# Patient Record
Sex: Female | Born: 1945 | Race: White | Hispanic: No | Smoking: Never smoker
Health system: Southern US, Community
[De-identification: ages and names within clinical notes are randomized; demographics above are authoritative.]

## PROBLEM LIST (undated history)

## (undated) DIAGNOSIS — K219 Gastro-esophageal reflux disease without esophagitis: Secondary | ICD-10-CM

## (undated) DIAGNOSIS — R609 Edema, unspecified: Secondary | ICD-10-CM

## (undated) DIAGNOSIS — M1990 Unspecified osteoarthritis, unspecified site: Secondary | ICD-10-CM

## (undated) DIAGNOSIS — I10 Essential (primary) hypertension: Secondary | ICD-10-CM

## (undated) DIAGNOSIS — M5126 Other intervertebral disc displacement, lumbar region: Secondary | ICD-10-CM

## (undated) DIAGNOSIS — E669 Obesity, unspecified: Secondary | ICD-10-CM

## (undated) DIAGNOSIS — D649 Anemia, unspecified: Secondary | ICD-10-CM

## (undated) DIAGNOSIS — K222 Esophageal obstruction: Secondary | ICD-10-CM

## (undated) DIAGNOSIS — H269 Unspecified cataract: Secondary | ICD-10-CM

## (undated) DIAGNOSIS — E785 Hyperlipidemia, unspecified: Secondary | ICD-10-CM

## (undated) HISTORY — DX: Unspecified cataract: H26.9

## (undated) HISTORY — DX: Gastro-esophageal reflux disease without esophagitis: K21.9

## (undated) HISTORY — PX: TONSILLECTOMY: SUR1361

## (undated) HISTORY — DX: Other intervertebral disc displacement, lumbar region: M51.26

## (undated) HISTORY — DX: Essential (primary) hypertension: I10

## (undated) HISTORY — DX: Unspecified osteoarthritis, unspecified site: M19.90

## (undated) HISTORY — DX: Edema, unspecified: R60.9

## (undated) HISTORY — DX: Obesity, unspecified: E66.9

## (undated) HISTORY — DX: Anemia, unspecified: D64.9

## (undated) HISTORY — DX: Hyperlipidemia, unspecified: E78.5

## (undated) HISTORY — DX: Esophageal obstruction: K22.2

## (undated) HISTORY — PX: BREAST CYST EXCISION: SHX579

---

## 1977-09-23 HISTORY — PX: TUBAL LIGATION: SHX77

## 1991-09-24 HISTORY — PX: BREAST BIOPSY: SHX20

## 1996-09-23 HISTORY — PX: LUMBAR LAMINECTOMY: SHX95

## 1999-06-28 ENCOUNTER — Encounter: Payer: Self-pay | Admitting: Obstetrics and Gynecology

## 1999-06-28 ENCOUNTER — Ambulatory Visit (HOSPITAL_COMMUNITY): Admission: RE | Admit: 1999-06-28 | Discharge: 1999-06-28 | Payer: Self-pay | Admitting: Obstetrics and Gynecology

## 1999-07-26 ENCOUNTER — Ambulatory Visit (HOSPITAL_COMMUNITY): Admission: RE | Admit: 1999-07-26 | Discharge: 1999-07-26 | Payer: Self-pay | Admitting: Obstetrics and Gynecology

## 1999-07-26 ENCOUNTER — Encounter: Payer: Self-pay | Admitting: Obstetrics and Gynecology

## 1999-08-08 ENCOUNTER — Ambulatory Visit (HOSPITAL_COMMUNITY): Admission: RE | Admit: 1999-08-08 | Discharge: 1999-08-08 | Payer: Self-pay | Admitting: Obstetrics and Gynecology

## 1999-08-08 ENCOUNTER — Encounter (INDEPENDENT_AMBULATORY_CARE_PROVIDER_SITE_OTHER): Payer: Self-pay | Admitting: Specialist

## 1999-09-24 HISTORY — PX: ABDOMINAL HYSTERECTOMY: SHX81

## 2000-05-05 ENCOUNTER — Encounter: Admission: RE | Admit: 2000-05-05 | Discharge: 2000-05-05 | Payer: Self-pay | Admitting: Obstetrics and Gynecology

## 2000-05-05 ENCOUNTER — Encounter: Payer: Self-pay | Admitting: Obstetrics and Gynecology

## 2000-06-10 ENCOUNTER — Ambulatory Visit (HOSPITAL_COMMUNITY): Admission: RE | Admit: 2000-06-10 | Discharge: 2000-06-10 | Payer: Self-pay | Admitting: Obstetrics and Gynecology

## 2000-06-10 ENCOUNTER — Encounter: Payer: Self-pay | Admitting: Obstetrics and Gynecology

## 2000-06-19 ENCOUNTER — Other Ambulatory Visit: Admission: RE | Admit: 2000-06-19 | Discharge: 2000-06-19 | Payer: Self-pay | Admitting: Obstetrics and Gynecology

## 2000-07-15 ENCOUNTER — Encounter: Payer: Self-pay | Admitting: Gastroenterology

## 2000-07-21 ENCOUNTER — Encounter: Payer: Self-pay | Admitting: Gastroenterology

## 2000-07-21 ENCOUNTER — Encounter (INDEPENDENT_AMBULATORY_CARE_PROVIDER_SITE_OTHER): Payer: Self-pay

## 2000-07-21 ENCOUNTER — Other Ambulatory Visit: Admission: RE | Admit: 2000-07-21 | Discharge: 2000-07-21 | Payer: Self-pay | Admitting: Gastroenterology

## 2000-07-22 ENCOUNTER — Encounter: Payer: Self-pay | Admitting: Gastroenterology

## 2000-07-22 ENCOUNTER — Ambulatory Visit (HOSPITAL_COMMUNITY): Admission: RE | Admit: 2000-07-22 | Discharge: 2000-07-22 | Payer: Self-pay | Admitting: Gastroenterology

## 2000-08-20 ENCOUNTER — Encounter (INDEPENDENT_AMBULATORY_CARE_PROVIDER_SITE_OTHER): Payer: Self-pay | Admitting: Specialist

## 2000-08-20 ENCOUNTER — Inpatient Hospital Stay (HOSPITAL_COMMUNITY): Admission: RE | Admit: 2000-08-20 | Discharge: 2000-08-23 | Payer: Self-pay | Admitting: Obstetrics and Gynecology

## 2001-05-06 ENCOUNTER — Encounter: Admission: RE | Admit: 2001-05-06 | Discharge: 2001-05-06 | Payer: Self-pay | Admitting: Obstetrics and Gynecology

## 2001-05-06 ENCOUNTER — Encounter: Payer: Self-pay | Admitting: Obstetrics and Gynecology

## 2001-05-12 ENCOUNTER — Encounter: Admission: RE | Admit: 2001-05-12 | Discharge: 2001-05-12 | Payer: Self-pay | Admitting: Obstetrics and Gynecology

## 2001-05-12 ENCOUNTER — Encounter: Payer: Self-pay | Admitting: Obstetrics and Gynecology

## 2001-07-24 ENCOUNTER — Other Ambulatory Visit: Admission: RE | Admit: 2001-07-24 | Discharge: 2001-07-24 | Payer: Self-pay | Admitting: Obstetrics and Gynecology

## 2001-08-13 ENCOUNTER — Other Ambulatory Visit: Admission: RE | Admit: 2001-08-13 | Discharge: 2001-08-13 | Payer: Self-pay | Admitting: Obstetrics and Gynecology

## 2002-05-07 ENCOUNTER — Encounter: Payer: Self-pay | Admitting: Family Medicine

## 2002-05-07 ENCOUNTER — Ambulatory Visit (HOSPITAL_COMMUNITY): Admission: RE | Admit: 2002-05-07 | Discharge: 2002-05-07 | Payer: Self-pay | Admitting: Family Medicine

## 2002-05-18 ENCOUNTER — Encounter: Admission: RE | Admit: 2002-05-18 | Discharge: 2002-05-18 | Payer: Self-pay | Admitting: Obstetrics and Gynecology

## 2002-05-18 ENCOUNTER — Encounter: Payer: Self-pay | Admitting: Obstetrics and Gynecology

## 2003-05-24 ENCOUNTER — Encounter: Payer: Self-pay | Admitting: Obstetrics and Gynecology

## 2003-05-24 ENCOUNTER — Encounter: Admission: RE | Admit: 2003-05-24 | Discharge: 2003-05-24 | Payer: Self-pay | Admitting: Obstetrics and Gynecology

## 2004-06-12 ENCOUNTER — Encounter: Admission: RE | Admit: 2004-06-12 | Discharge: 2004-06-12 | Payer: Self-pay | Admitting: Obstetrics and Gynecology

## 2004-06-18 ENCOUNTER — Encounter: Admission: RE | Admit: 2004-06-18 | Discharge: 2004-06-18 | Payer: Self-pay | Admitting: Obstetrics and Gynecology

## 2004-11-28 ENCOUNTER — Encounter: Admission: RE | Admit: 2004-11-28 | Discharge: 2004-11-28 | Payer: Self-pay | Admitting: Obstetrics and Gynecology

## 2005-06-19 ENCOUNTER — Encounter: Admission: RE | Admit: 2005-06-19 | Discharge: 2005-06-19 | Payer: Self-pay | Admitting: Obstetrics and Gynecology

## 2006-06-23 ENCOUNTER — Encounter: Admission: RE | Admit: 2006-06-23 | Discharge: 2006-06-23 | Payer: Self-pay | Admitting: Obstetrics and Gynecology

## 2007-07-08 ENCOUNTER — Encounter: Admission: RE | Admit: 2007-07-08 | Discharge: 2007-07-08 | Payer: Self-pay | Admitting: Obstetrics and Gynecology

## 2008-06-13 ENCOUNTER — Ambulatory Visit: Payer: Self-pay | Admitting: Gastroenterology

## 2008-06-20 ENCOUNTER — Ambulatory Visit: Payer: Self-pay | Admitting: Gastroenterology

## 2008-07-11 ENCOUNTER — Encounter: Admission: RE | Admit: 2008-07-11 | Discharge: 2008-07-11 | Payer: Self-pay | Admitting: Obstetrics and Gynecology

## 2008-08-31 ENCOUNTER — Ambulatory Visit (HOSPITAL_COMMUNITY): Admission: RE | Admit: 2008-08-31 | Discharge: 2008-08-31 | Payer: Self-pay | Admitting: Obstetrics and Gynecology

## 2008-09-08 ENCOUNTER — Encounter (INDEPENDENT_AMBULATORY_CARE_PROVIDER_SITE_OTHER): Payer: Self-pay | Admitting: Interventional Radiology

## 2008-09-08 ENCOUNTER — Other Ambulatory Visit: Admission: RE | Admit: 2008-09-08 | Discharge: 2008-09-08 | Payer: Self-pay | Admitting: Interventional Radiology

## 2008-09-08 ENCOUNTER — Encounter: Admission: RE | Admit: 2008-09-08 | Discharge: 2008-09-08 | Payer: Self-pay | Admitting: Obstetrics and Gynecology

## 2008-12-12 ENCOUNTER — Encounter: Payer: Self-pay | Admitting: Cardiovascular Disease

## 2008-12-12 ENCOUNTER — Ambulatory Visit: Payer: Self-pay | Admitting: Cardiovascular Disease

## 2008-12-12 DIAGNOSIS — E663 Overweight: Secondary | ICD-10-CM | POA: Insufficient documentation

## 2008-12-12 DIAGNOSIS — R9431 Abnormal electrocardiogram [ECG] [EKG]: Secondary | ICD-10-CM | POA: Insufficient documentation

## 2008-12-12 DIAGNOSIS — E782 Mixed hyperlipidemia: Secondary | ICD-10-CM | POA: Insufficient documentation

## 2008-12-12 DIAGNOSIS — I10 Essential (primary) hypertension: Secondary | ICD-10-CM | POA: Insufficient documentation

## 2008-12-12 DIAGNOSIS — E1169 Type 2 diabetes mellitus with other specified complication: Secondary | ICD-10-CM | POA: Insufficient documentation

## 2009-01-10 ENCOUNTER — Ambulatory Visit: Payer: Self-pay | Admitting: Cardiovascular Disease

## 2009-01-10 ENCOUNTER — Encounter: Payer: Self-pay | Admitting: Cardiovascular Disease

## 2009-01-10 LAB — CONVERTED CEMR LAB
CO2: 30 meq/L (ref 19–32)
Calcium: 9.4 mg/dL (ref 8.4–10.5)
Chloride: 105 meq/L (ref 96–112)
GFR calc non Af Amer: 89.25 mL/min (ref >60)
Glucose, Bld: 98 mg/dL (ref 70–99)
Potassium: 5 meq/L (ref 3.5–5.1)

## 2009-07-11 DIAGNOSIS — E7800 Pure hypercholesterolemia, unspecified: Secondary | ICD-10-CM | POA: Insufficient documentation

## 2009-07-12 ENCOUNTER — Ambulatory Visit: Payer: Self-pay | Admitting: Cardiovascular Disease

## 2009-07-12 ENCOUNTER — Encounter: Admission: RE | Admit: 2009-07-12 | Discharge: 2009-07-12 | Payer: Self-pay | Admitting: Obstetrics and Gynecology

## 2009-07-18 ENCOUNTER — Encounter: Admission: RE | Admit: 2009-07-18 | Discharge: 2009-07-18 | Payer: Self-pay | Admitting: Obstetrics and Gynecology

## 2010-07-23 ENCOUNTER — Encounter: Admission: RE | Admit: 2010-07-23 | Discharge: 2010-07-23 | Payer: Self-pay | Admitting: Obstetrics and Gynecology

## 2010-09-28 ENCOUNTER — Encounter: Payer: Self-pay | Admitting: Gastroenterology

## 2010-10-14 ENCOUNTER — Encounter: Payer: Self-pay | Admitting: Obstetrics and Gynecology

## 2010-10-25 NOTE — Letter (Signed)
Summary: New Patient letter  [facility]  [address] [place], [place] [address]   Phone: [phone]  Fax: [phone]       09/28/2010 MRN: [id number]  Sarah Benton [address] [place], [place]  [address]  Dear Sarah Benton,  Welcome to the Gastroenterology Division at [facility].    You are scheduled to see Dr.  Russella Dar on 11-06-10 at 2:30pm on the 3rd floor at [facility], [address].  We ask that you try to arrive at our office 15 minutes prior to your appointment time to allow for check-in.  We would like you to complete the enclosed self-administered evaluation form prior to your visit and bring it with you on the day of your appointment.  We will review it with you.  Also, please bring a complete list of all your medications or, if you prefer, bring the medication bottles and we will list them.  Please bring your insurance card so that we may make a copy of it.  If your insurance requires a referral to see a specialist, please bring your referral form from your primary care physician.  Co-payments are due at the time of your visit and may be paid by cash, check or credit card.     Your office visit will consist of a consult with your physician (includes a physical exam), any laboratory testing he/she may order, scheduling of any necessary diagnostic testing (e.g. x-ray, ultrasound, CT-scan), and scheduling of a procedure (e.g. Endoscopy, Colonoscopy) if required.  Please allow enough time on your schedule to allow for any/all of these possibilities.    If you cannot keep your appointment, please call [phone] to cancel or reschedule prior to your appointment date.  This allows [place] the opportunity to schedule an appointment for another patient in need of care.  If you do not cancel or reschedule by 5 p.m. the business day prior to your appointment date, you will be charged a $50.00 late cancellation/no-show fee.    Thank you for choosing [facility] for your medical needs.  We appreciate the opportunity to care for you.  Please visit [place] at our website  to learn more about our practice.                     Sincerely,                                                             The Gastroenterology Division

## 2010-11-08 ENCOUNTER — Ambulatory Visit (INDEPENDENT_AMBULATORY_CARE_PROVIDER_SITE_OTHER): Payer: Medicare Other | Admitting: Gastroenterology

## 2010-11-08 ENCOUNTER — Encounter: Payer: Self-pay | Admitting: Gastroenterology

## 2010-11-08 DIAGNOSIS — R1319 Other dysphagia: Secondary | ICD-10-CM

## 2010-11-08 DIAGNOSIS — K219 Gastro-esophageal reflux disease without esophagitis: Secondary | ICD-10-CM

## 2010-11-08 NOTE — Assessment & Plan Note (Signed)
Summary: Gastroenterology  [facility]   GI CONSULTATION NOTE   NAME:   Sarah, Benton        OFFICE NO:  [id number]  DATE:  07/15/00                    REFERRING PHYSICIAN:  Tracey Harries, M.D.  REASON FOR REFERRAL:  Left lower quadrant pain.  HISTORY:  Sarah Benton is  a very nice, 65 year old white female referred through the courtesy of Dr. Tracey Harries.  She relates a 3 to 4 week history of constant left lower quadrant ache.  It is somewhat changed by movement and bending, however, it has not been affected by meals or bowel movements.  Her symptoms were constant for about 3 weeks but now have become intermittent for the past week.  She was evaluated by Dr. Ambrose Mantle for this problem after having an episode of abnormal vaginal bleeding.  Pelvic examination and rectal examination were unremarkable and subsequent stool hemoccults were also negative X 6.  She underwent a pelvic ultrasound with transvaginal ultrasound on 06/10/00 which revealed thickening of the endometrium and a left ovarian cyst that appeared simple, measuring 3.3 cm. In greatest dimension.  She relates no change in bowel habits, diarrhea, constipation, melena or hematochezia.  The remainder of the review of systems as listed in the handwritten diagnostic evaluation form.  PAST MEDICAL & SURGICAL HISTORY:  Benign left breast cyst biopsy in 1993, status post lumbar laminectomy in 1998 with lumbar arthritis, status post D&C in 2000.   SOCIAL HISTORY:   She denies cigarette and alcohol usage.  She is retired and married with two children.  MEDICATIONS:  None.     DRUG ALLERGIES: Septra leading to nausea.  FH: Family history is negative for colon cancer, colon polyps, and inflammatory bowel disease.  Her mother had ovarian cancer.     PHYSICAL EXAMINATION:  Patient is an overweight, white female, in no acute distress.  Height: 5'5".  Weight is 224 lbs.  Blood pressure is 130/88.  Pulse is 60 and regular.  HEENT  exam: anicteric sclerae.  Oropharynx is clear.  Chest: clear to auscultation and percussion.  Cardiac: regular rate and rhythm without murmurs .appreciated.  Abdomen is large, soft and non-distended.  There is mild tenderness along the left upper side and very left lower quadrant.  No definite hernia or other abnormality is palpated.  No adenopathy is palpated.  No hepatosplenomegaly or masses.  Normal active bowel sounds.  Rectal exam: deferred with recent exam that was unremarkable by Dr. Ambrose Mantle with Hemoccult negative.   ASSESSMENT:   Atypical left lower quadrant and left upper thigh.  Gastrointestinal vs. musculoskeletal etiologies.  Rule out a subtle inguinal or femoral hernia.  Will proceed with an abdominal and pelvic CT scan for further evaluation.  Will also proceed with a colonoscopy for colorectal cancer screening and evaluation for colorectal neoplasms, diverticulosis and other abnormalities. Symptoms may also be related to her known left ovarian cyst.     Judie Petit T. Pleas Koch. M.D. F.A.C.G.  [id number] cc:  Dr. Tracey Harries  D: 07/15/00; T: 07/16/00; Job # [id number]

## 2010-11-08 NOTE — Procedures (Signed)
Summary: Colonoscopy and biopsy   Colonoscopy  Procedure date:  07/21/2000  Findings:      Pathology:  Hyperplastic polyp.     Location:  [facility].    Procedures Next Due Date:    Colonoscopy: 07/2005 Patient Name: Sarah Benton, Sarah Benton MRN:  Procedure Procedures: Colonoscopy CPT: [id number].    with Hot Biopsy(s)CPT: [id number].  Personnel: Endoscopist: Venita Lick. Russella Dar, MD, Clementeen Graham.  Referred By: Malachi Pro. Ambrose Mantle, MD.  Exam Location: Exam performed in [facility] Outpatient  Patient Consent: Procedure, Alternatives, Risks and Benefits discussed, consent obtained, from patient. Consent to be contacted was not given.  Indications Symptoms: Abdominal pain / bloating.  Average Risk Screening Routine.  History  Pre-Exam Physical: Performed Jul 21, 2000. Cardio-pulmonary exam, Rectal exam, HEENT exam , Abdominal exam, Extremity exam, Neurological exam WNL.  Exam Exam: Extent of exam reached: Cecum, extent intended: Cecum.  The cecum was identified by appendiceal orifice and IC valve. Patient position: on left side. Colon retroflexion performed. Images taken. ASA Classification: I. Tolerance: good.  Monitoring: Pulse and BP monitoring, Oximetry used. Supplemental O2 given.  Colon Prep Used Golytely for colon prep. Dose Used: 4L. Prep results: good.  Sedation Meds: Fentanyl 100 mcg. Versed 10 mg.  Findings NORMAL EXAM: Cecum.  POLYP: Sigmoid Colon, Maximum size: 6 mm. sessile polyp. Distance from Anus 25 cm. Procedure:  hot biopsy, removed, retrieved, Polyp sent to pathology. ICD9: Colon Polyps: 211.3.   Assessment Abnormal examination, see findings above.  Diagnoses: 211.3: Colon Polyps.   Events  Unplanned Interventions: No intervention was required.  Unplanned Events: There were no complications. Plans  Post Exam Instructions: No aspirin or non-steroidal containing medications: 2 weeks.  Medication Plan: Continue current medications.  Patient  Education: Patient given standard instructions for: Polyps. Patient instructed to get routine colonoscopy every 5 years.  Disposition: After procedure patient sent to recovery. After recovery patient sent home.  Scheduling/Referral: Referring provider, to [employer]. Ambrose Mantle, MD,  Colonoscopy, to [employer] T. Russella Dar, MD, [facility], around Jul 21, 2005.   Comments: No GI etiology for LLQ pain found. This report was created from the original endoscopy report, which was reviewed and signed by the above listed endoscopist.    cc: Malachi Pro. Ambrose Mantle, MD   SP Surgical Pathology - STATUS: Final             By: Guilford Shi MD , Clovis Pu       Perform Date: 29Oct01 13:58  Ordered By: Rica Records Date:  Facility: [facility]                              Department: CPATH  Service Report Text  [facility]   [address] [place], [place] [address]   [phone]    REPORT OF SURGICAL PATHOLOGY    Case #: [id number]   Patient Name: Sarah Benton, Sarah Benton   PID: [id number]   Pathologist: Marcie Bal, MD   DOB/Age [birth year] (Age: 65) Gender: F   Date Taken: 07/21/00   Date Received: 07/21/00    FINAL DIAGNOSIS    ***MICROSCOPIC EXAMINATION AND DIAGNOSIS***    COLON: HYPERPLASTIC POLYP. NO ADENOMATOUS CHANGE OR MALIGNANCY   IDENTIFIED.    ab   Date Reported: 07/22/00 Marcie Bal, MD   *** Electronically Signed Out By TAZ ***    specimen(s) obtained  Colon, polyp, sigmoid    Gross Description   Colon, polyp, sigmoid:   Received in formalin is a tan, soft tissue fragment that is   submitted in toto. Size: 0.3 cm (JBM:atb 10/29)    ab/

## 2010-11-14 ENCOUNTER — Encounter (AMBULATORY_SURGERY_CENTER): Payer: Medicare Other | Admitting: Gastroenterology

## 2010-11-14 ENCOUNTER — Other Ambulatory Visit: Payer: Self-pay | Admitting: Gastroenterology

## 2010-11-14 ENCOUNTER — Encounter: Payer: Self-pay | Admitting: Gastroenterology

## 2010-11-14 DIAGNOSIS — K219 Gastro-esophageal reflux disease without esophagitis: Secondary | ICD-10-CM

## 2010-11-14 DIAGNOSIS — K2970 Gastritis, unspecified, without bleeding: Secondary | ICD-10-CM

## 2010-11-14 DIAGNOSIS — K2990 Gastroduodenitis, unspecified, without bleeding: Secondary | ICD-10-CM

## 2010-11-14 DIAGNOSIS — K222 Esophageal obstruction: Secondary | ICD-10-CM

## 2010-11-14 DIAGNOSIS — K319 Disease of stomach and duodenum, unspecified: Secondary | ICD-10-CM

## 2010-11-14 DIAGNOSIS — R1319 Other dysphagia: Secondary | ICD-10-CM

## 2010-11-14 NOTE — Assessment & Plan Note (Signed)
Summary: DYSPHAGIA/YF--SCH WITH DEBBIE [phone] MEDICARE AND BLUE .Marland KitchenMarland Kitchen   History of Present Illness Visit Type: Initial Consult Primary GI MD: Elie Goody MD [facility] Primary Provider: Paulene Floor, [place] Requesting Provider: Rosiland Oz Chief Complaint: Dysphagia, Feels like food is getting stuck in her esophagus History of Present Illness:   This is a 65 year old female that I have seen in the past. She relates a history of recurrent solid food dysphagia since the summer of 02/09/2010. She also frequently notes substernal pressure when she goes to bed at night. These symptoms are generally relieved with over-the-counter Zantac. Her dysphagia occurs about once or twice each week and she has had to induce vomiting on multiple occasions.   GI Review of Systems    Reports belching and  dysphagia with solids.      Denies abdominal pain, acid reflux, bloating, chest pain, dysphagia with liquids, heartburn, loss of appetite, nausea, vomiting, vomiting blood, weight loss, and  weight gain.        Denies anal fissure, black tarry stools, change in bowel habit, constipation, diarrhea, diverticulosis, fecal incontinence, heme positive stool, hemorrhoids, irritable bowel syndrome, jaundice, light color stool, liver problems, rectal bleeding, and  rectal pain. Preventive Screening-Counseling & Management      Drug Use:  no.     Current Medications (verified): 1)  Lipitor 40 Mg Tabs (Atorvastatin Calcium) .Marland Kitchen.. 1 Tab By Mouth Once Daily 2)  Lisinopril-Hydrochlorothiazide 20-12.5 Mg Tabs (Lisinopril-Hydrochlorothiazide) .Marland Kitchen.. 1 By Mouth Once Daily 3)  Vitamin D3 2000 Unit Caps (Cholecalciferol) .Marland Kitchen.. 1 By Mouth Once Daily 4)  Mobic 15 Mg Tabs (Meloxicam) .Marland Kitchen.. 1 By Mouth Once Daily  Allergies (verified): No Known Drug Allergies  Past History:  Past Medical History: ESSENTIAL HYPERTENSION, BENIGN (ICD-401.1) MIXED HYPERLIPIDEMIA (ICD-272.2) HYPERCHOLESTEROLEMIA (ICD-272.0) EDEMA  (ICD-782.3) Abnormal ECG Ruptured lumbar disc Arthritis Hyperplastic Polyp 02-10-00 Obesity  Past Surgical History: Hysterectomy Lumbar laminectomy in 1997-02-09  Left breast biopsy in Feb 10, 1992  Tubal ligation in 1978-02-09  Family History: Mother died age 5 Parkinsons Father died age 38 Prostate Ca No FH of Colon Cancer:  Social History: Married Sedentary Non-drinker Non-smoker Two children Retired from school system.  Cares of grandaughter with apraxia Illicit Drug Use - no Drug Use:  no  Review of Systems       The patient complains of arthritis/joint pain and back pain.         The pertinent positives and negatives are noted as above and in the HPI. All other ROS were reviewed and were negative.   Vital Signs:  Patient profile:   65 year old female Height:      65 inches Weight:      228 pounds BMI:     38.08 BSA:     2.09 Pulse rate:   80 / minute Pulse rhythm:   regular BP sitting:   118 / 82  (left arm)  Vitals Entered By: Merri Ray CMA Duncan Dull) (November 08, 2010 9:26 AM)  Physical Exam  General:  Well developed, well nourished, no acute distress. Head:  Normocephalic and atraumatic. Eyes:  PERRLA, no icterus. Ears:  Normal auditory acuity. Mouth:  No deformity or lesions, dentition normal. Neck:  Supple; no masses or thyromegaly. Lungs:  Clear throughout to auscultation. Heart:  Regular rate and rhythm; no murmurs, rubs,  or bruits. Abdomen:  Soft, nontender and nondistended. No masses, hepatosplenomegaly or hernias noted. Normal bowel sounds. Msk:  Symmetrical with no gross deformities. Normal posture. Pulses:  Normal pulses noted. Extremities:  No clubbing, cyanosis, edema or deformities noted. Neurologic:  Alert and  oriented x4;  grossly normal neurologically. Cervical Nodes:  No significant cervical adenopathy. Inguinal Nodes:  No significant inguinal adenopathy. Psych:  Alert and cooperative. Normal mood and affect.  Impression &  Recommendations:  Problem # 1:  OTHER DYSPHAGIA (ICD-787.29) Solid food dysphagia for the past 9 months. I suspect she has a peptic stricture, rule out neoplasms, and other disorders. Please take Zantac on a daily basis until endoscopy and maintain standard antireflux measures. The risks, benefits and alternatives to endoscopy with possible biopsy and possible dilation were discussed with the patient and they consent to proceed. The procedure will be scheduled electively. Orders: EGD SAV (EGD SAV)  Problem # 2:  GERD (ICD-530.81) Suspected underlying GERD. Plans as above. If she does have a peptic stricture or, esophagitis plan to begin a proton pump inhibitor after her endoscopy. Orders: EGD SAV (EGD SAV)  Problem # 3:  SCREENING COLORECTAL-CANCER (ICD-V76.51) Average risk for colorectal cancer. Colonoscopy due October 2019.  Patient Instructions: 1)  Avoid foods high in acid content ( tomatoes, citrus juices, spicy foods) . Avoid eating within 3 to 4 hours of lying down or before exercising. Do not over eat; try smaller more frequent meals. Elevate head of bed four inches when sleeping.  2)  Upper Endoscopy brochure given.  3)  Copy sent to : Paulene Floor, [place]   4)  The medication list was reviewed and reconciled.  All changed / newly prescribed medications were explained.  A complete medication list was provided to the patient / caregiver.

## 2010-11-14 NOTE — Letter (Signed)
Summary: EGD Instructions  [facility]  [address] [place], [place] [address]   Phone: [phone]  Fax: [phone]       Sarah Benton    March 13, 1946    MRN: [id number]       Procedure Day Dorna Bloom: Wednesday February 22nd, 2012     Arrival Time:  10:30am     Procedure Time: 11:30am     Location of Procedure:                    _ x _ [facility] (4th Floor)    PREPARATION FOR ENDOSCOPY   On 11/14/10 THE DAY OF THE PROCEDURE:  1.   No solid foods, milk or milk products are allowed after midnight the night before your procedure.  2.   Do not drink anything colored red or purple.  Avoid juices with pulp.  No orange juice.  3.  You may drink clear liquids until 9:30am, which is 2 hours before your procedure.                                                                                                CLEAR LIQUIDS INCLUDE: Water Jello Ice Popsicles Tea (sugar ok, no milk/cream) Powdered fruit flavored drinks Coffee (sugar ok, no milk/cream) Gatorade Juice: apple, white grape, white cranberry  Lemonade Clear bullion, consomm, broth Carbonated beverages (any kind) Strained chicken noodle soup Hard Candy   MEDICATION INSTRUCTIONS  Unless otherwise instructed, you should take regular prescription medications with a small sip of water as early as possible the morning of your procedure.        OTHER INSTRUCTIONS  You will need a responsible adult at least 67 years of age to accompany you and drive you home.   This person must remain in the waiting room during your procedure.  Wear loose fitting clothing that is easily removed.  Leave jewelry and other valuables at home.  However, you may wish to bring a book to read or an iPod/MP3 player to listen to music as you wait for your procedure to start.  Remove all body piercing jewelry and leave at home.  Total time from sign-in until discharge is approximately 2-3 hours.  You should go  home directly after your procedure and rest.  You can resume normal activities the day after your procedure.  The day of your procedure you should not:   Drive   Make legal decisions   Operate machinery   Drink alcohol   Return to work  You will receive specific instructions about eating, activities and medications before you leave.    The above instructions have been reviewed and explained to me by   _______________________    I fully understand and can verbalize these instructions _____________________________ Date _________

## 2010-11-19 ENCOUNTER — Encounter: Payer: Self-pay | Admitting: Gastroenterology

## 2010-11-20 NOTE — Miscellaneous (Signed)
Summary: RX omeprazole 20mg  po  Clinical Lists Changes  Medications: Added new medication of OMEPRAZOLE 20 MG  CPDR (OMEPRAZOLE) 1 each day 30 minutes before meal - Signed Rx of OMEPRAZOLE 20 MG  CPDR (OMEPRAZOLE) 1 each day 30 minutes before meal;  #30 x 11;  Signed;  Entered by: Sherren Kerns RN;  Authorized by: Meryl Dare MD Clementeen Graham;  Method used: Faxed to [facility], [address] [place], [place], [place]  [address], Ph: [phone] or [phone], Fax: [phone] Observations: Added new observation of ALLERGY REV: Done (11/14/2010 11:59) Added new observation of NKA: T (11/14/2010 11:59)    Prescriptions: OMEPRAZOLE 20 MG  CPDR (OMEPRAZOLE) 1 each day 30 minutes before meal  #30 x 11   Entered by:   Sherren Kerns RN   Authorized by:   Meryl Dare MD [facility]   Signed by:   Sherren Kerns RN on 11/14/2010   Method used:   Faxed to ...       [facility]       [address]       [place], [place]  [address]       Ph: [phone] or [phone]       Fax: [phone]   RxID:   [id number]

## 2010-11-20 NOTE — Procedures (Addendum)
Summary: Upper Endoscopy w/DIL  Patient: Sarah Benton Note: All result statuses are Final unless otherwise noted.  Tests: (1) Upper Endoscopy w/DIL (UED)  UED Upper Endoscopy w/DIL                             DONE     [facility]     [address]     [place], [place]  [address]           ENDOSCOPY PROCEDURE REPORT           PATIENT:  [name], [name]  MR#:  [id number]     BIRTHDATE:  [birth year], 64 yrs. old  GENDER:  female     ENDOSCOPIST:  Judie Petit T. Russella Dar, MD, [facility]           PROCEDURE DATE:  11/14/2010     PROCEDURE:  EGD with dilatation over guidewire, EGD with biopsy     ASA CLASS:  Class II     INDICATIONS:  1) dysphagia  2) GERD     MEDICATIONS:  Fentanyl 75 mcg IV, Versed 9 mg IV     TOPICAL ANESTHETIC:  Exactacain Spray     DESCRIPTION OF PROCEDURE:   After the risks benefits and     alternatives of the procedure were thoroughly explained, informed     consent was obtained.  The [id number] endoscope was     introduced through the mouth and advanced to the second portion of     the duodenum, without limitations.  The instrument was slowly     withdrawn as the mucosa was carefully examined.           <<PROCEDUREIMAGES>>           A stricture was found at the gastroesophageal junction. It was     benign appearing and circumferential. It was 12 mm in diameter.     Multiple biopsies were obtained and sent to pathology.  Mild     gastritis was found in the antrum. It was erosive and     erythematous. Multiple biopsies were obtained and sent to     pathology. The duodenal bulb was normal in appearance, as was the     postbulbar duodenum. The examination was otherwise normal.     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 Russella Dar, MD, Clementeen Graham           CC:  Rudi Heap, MD  n.     eSIGNED:   Malcolm T. Stark at 11/14/2010 11:43 AM           Benjiman Core, [id number]  Note: An exclamation mark (!) indicates a result that was not dispersed into the flowsheet. Document Creation Date: 11/14/2010 11:44 AM _______________________________________________________________________  (1) Order result status: Final Collection or observation date-time: 11/14/2010 11:37 Requested date-time:  Receipt date-time:  Reported date-time:  Referring Physician:   Ordering Physician: Claudette Head [phone]) Specimen Source:  Source: Launa Grill Order Number: [id number] Lab site:

## 2010-11-29 NOTE — Letter (Signed)
Summary: Patient [facility] Biopsy Results  [facility]  [address] [place], [place] [address]   Phone: [phone]  Fax: [phone]        November 19, 2010 MRN: [id number]    Sarah Benton [address] [place], [place]  [address]    Dear Ms. Bellisario,  I am pleased to inform you that the biopsies taken during your recent endoscopic examination did not show any evidence of cancer upon pathologic examination. The biopsies showed a mild reactive gastopathy.  Continue with the treatment plan as outlined on the day of your      exam.  Please call us if you are having persistent problems or have questions about your condition that have not been fully answered at this time.  Sincerely,  Meryl Dare MD [facility]  This letter has been electronically signed by your physician.  Appended Document: Patient Notice-Endo Biopsy Results letter mailed

## 2011-01-17 ENCOUNTER — Ambulatory Visit (INDEPENDENT_AMBULATORY_CARE_PROVIDER_SITE_OTHER): Payer: Medicare Other | Admitting: Gastroenterology

## 2011-01-17 ENCOUNTER — Encounter: Payer: Self-pay | Admitting: Gastroenterology

## 2011-01-17 VITALS — BP 104/66 | HR 60 | Ht 65.0 in | Wt 225.4 lb

## 2011-01-17 DIAGNOSIS — K219 Gastro-esophageal reflux disease without esophagitis: Secondary | ICD-10-CM

## 2011-01-17 DIAGNOSIS — K222 Esophageal obstruction: Secondary | ICD-10-CM

## 2011-01-17 DIAGNOSIS — Z1211 Encounter for screening for malignant neoplasm of colon: Secondary | ICD-10-CM

## 2011-01-17 NOTE — Patient Instructions (Signed)
Cc: Paulene Floor, NP

## 2011-01-17 NOTE — Progress Notes (Signed)
History of Present Illness: This is a 65 year old female who returns for followup of dysphagia. She underwent endoscopy in February with dilation of an esophagogastric junction stricture to 16 mm. She has had no dysphagia or reflux symptoms. She is currently maintained on omeprazole.  Current Medications, Allergies, Past Medical History, Past Surgical History, Family History and Social History were reviewed in [employer] record.  Physical Exam: General: Well developed , well nourished, no acute distress Head: Normocephalic and atraumatic Eyes:  sclerae anicteric, EOMI Ears: Normal auditory acuity Mouth: No deformity or lesions Lungs: Clear throughout to auscultation Heart: Regular rate and rhythm; no murmurs, rubs or bruits Abdomen: Soft, non tender and non distended. No masses, hepatosplenomegaly or hernias noted. Normal bowel sounds Musculoskeletal: Symmetrical with no gross deformities  Pulses:  Normal pulses noted Extremities: No clubbing, cyanosis, edema or deformities noted Neurological: Alert oriented x 4, grossly nonfocal Psychological:  Alert and cooperative. Normal mood and affect  Assessment and Recommendations:  1. GERD with a peptic stricture. Long-term antireflux measures. Long-term PPI therapy to help prevent stricture recurrence. Continue omeprazole 20 mg every morning. Ongoing followup with her primary physician and I will see back on referral.  2. SCREENING, COLORECTALCANCER Average risk for colorectal cancer. Colonoscopy due October 2019.

## 2011-02-08 NOTE — Op Note (Signed)
[facility]  Patient:    Sarah Benton, Sarah Benton             MRN: [id number] Proc. Date: 08/20/00 Adm. Date:  [id number] Attending:  Malon Kindle                           Operative Report  PREOPERATIVE DIAGNOSES: 1. Left adnexal mass. 2. Persistent left lower abdominal pain. 3. Negative gastrointestinal workup.  POSTOPERATIVE DIAGNOSIS: 1. Left adnexal mass. 2. Persistent left lower abdominal pain. 3. Negative gastrointestinal workup. 4. Left hydrosalpinx. 5. Dense pelvic adhesions.  OPERATION: 1. Abdominal hysterectomy. 2. Bilateral salpingo-oophorectomy. 3. Lysis of adhesions.  SURGEON:  Malachi Pro. Ambrose Mantle, M.D.  ASSISTANT:  Zenaida Niece, M.D.  ANESTHESIA:  General.  DESCRIPTION OF PROCEDURE:  The patient was brought to the operating room and placed under satisfactory general anesthesia.  She was placed in frog leg position.  The abdomen, vulva, vagina, and urethra were prepped with Betadine solution.  The bladder was catheterized with a Foley and hooked to straight drain.  The patient was then placed supine.  The abdomen draped in a sterile filed and was entered with a transverse incision above the lower abdominal fold and carried in layers through the skin, subcutaneous tissue, and fascia. The fascia was separated from the rectus muscle superiorly and inferiorly. The rectus muscle was split in the midline.  Peritoneum was opened vertically. It was difficult to examine the upper abdomen.  I never felt the left kidney. I did feel the right kidney.  I felt the liver which seemed to be smooth, and the gallbladder was smooth and tensely cystic.  Exploration of the pelvis revealed the left adnexal mass, but on dissection of adhesions surrounding it, it was not the left ovary but was a hydrosalpinx.  There were a lot of adhesions binding it to the left pelvic wall.  There were also adhesions binding the distal part of the right  tube to the pelvic wall.  There were adhesions posterior to the uterus in the cul-de-sac.  The uterus had very minimal mobility.  After using packs and retractors to prepare the operative field, I did elevate the uterus as much as possible with clamps across the upper pedicles.  I could not do anything to the right adnexa because visibility was poor.  I did clamp, cut, and suture ligate the left infundibulopelvic ligament and doubly suture ligated it.  I then cauterized the left round ligament, created a bladder flap, cauterized the right round ligament, and cut the right utero-ovarian ligament and suture ligated it.  I then gradually in stepwise fashion worked down the sides of the uterus. Visibility was always poor.  I finally reached the uterosacral ligaments, clamped, cut, suture ligated them, and held them.  I kept the bladder well inferior to the operative field.  I entered the right side of the vagina and then removed the uterus, left tube, and ovary by transecting the upper vagina. Vaginal angle sutures were placed, and then the central portion of the cuff was closed with interrupted figure-of-eight sutures of 0 Vicryl.  I sutured the uterosacral ligaments together in the midline, filled the bladder with methylene blue stained fluid.  There was no leakage and no sutures close to the bladder.  I then was able to see the right adnexa better.  I doubly suture ligated the right infundibulopelvic ligament and removed the right tube and ovary by cutting above clamps  and suture ligating all the clamps.  Hemostasis was achieved posterior to the cuff.  Reperitonealization was done across the cuff.  Visual inspection of all pedicles revealed no active bleeding.  There were some omental adhesions stuck to the left lateral abdominal wall that were not lysed.  The omentum was placed over the pelvis.  The abdominal wall was then closed in layers, and it should be mentioned that washings had been  taken at the outset of the operation, but they were discarded because there was nothing suggestive of malignancy, and the mass was actually a hydrosalpinx. The abdominal wall was 0 closed with a running suture of 0 Vicryl on the peritoneum, interrupted 0 Vicryl in the rectus muscle, two running sutures of 0 Vicryl on the fascia, a running 3-0 Vicryl on the subcutaneous tissue, and staples on the skin.  The patient seemed to tolerate the procedure well. Blood loss was thought to be 700 cc.  Sponge and needle counts were correct, and she was returned to recovery in satisfactory condition.DD:  08/20/00 TD:  08/20/00 Job: [id number]

## 2011-02-08 NOTE — Discharge Summary (Signed)
[facility]  Patient:    Sarah Benton, Sarah Benton             MRN: [id number] Adm. Date:  [id number] Disc. Date: [id number] Attending:  Malon Kindle                           Discharge Summary  HISTORY OF PRESENT ILLNESS AND HOSPITAL COURSE:  This is a 65 year old white female admitted for surgical removal of a left adnexal mass.  The patient also has left lower quadrant pain, has had a negative GI workup.  The patient underwent a TAH and BSO on August 20, 2000.  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  Postoperatively, the patient did well.  She had good bowel and urinary function, ambulated well without difficulty.  Her incision healed appropriately.  Staples were removed, strips were applied, and on the third postoperative day she was discharged. Pathology report showed left ovary and tube, benign ovarian serous cystadenoma.  No evidence of borderline change or malignancy.  No fallopian tube tissue identified in the mass that was sent as the left ovary and tube. Uterine cervix, right ovary, and fallopian tube:  Mild cervicitis and squamous metaplasia, proliferative endometrium, and endometrial polyp with foci of simple hyperplasia without atypia.  No atypical features or evidence of carcinoma.  Adenomyosis, leiomyomata intramural and subserosal.  Serosal uterine fibrous adhesions right ovary with focal endosalpingiosis.  Right fallopian tube benign peritubal cyst.  LABORATORY DATA:  Admission hemoglobin 13.0, hematocrit 37.3, white count 9500, platelet count 277,000, 69 segs, 23 lymphs, 4 monos, 1 eosinophil, 1 basophil.  Comprehensive metabolic profile was normal.  Urinalysis was negative except for 6-10 white cells, 0-5 red cells.  EKG:  Normal sinus rhythm, low voltage, borderline EKG.  No significant change since last  tracing.  FINAL DIAGNOSES: 1. Left adnexal mass by pathology report secondary to serous cystadenoma of    the left ovary. 2. Persistent left lower abdominal pain 3. Adenomyosis. 4. Uterine leiomyomata. 5. Pelvic adhesions.  OPERATIONS:  Abdominal hysterectomy, bilateral salpingo-oophorectomy, lysis of adhesions.  CONDITION ON DISCHARGE:  Improved.  DISCHARGE INSTRUCTIONS:  No vaginal entrance.  No heavy lifting or strenuous activity.  Report any consistent temperature elevation greater than 100.4 degrees.  Report any heavy vaginal bleeding.  FOLLOW-UP:  Return to the office in 10-14 days.  MEDICATIONS:  Take Mepergan Fortis 16 tablets 1 every four to six hours as needed for pain.  The patient is given a prescription for doxycycline 100 mg 14 tablets, to take 1 by mouth every 12 hours for seven days if her temperature does continue to be above 100.4.  She has had two elevations in the hospital, on the first postoperative day 100.9 and on the second postoperative day 100.4.  She is to call the physician if her temperature continues to be elevated and begin the doxycycline at his request. DD:  08/23/00 TD:  08/24/00 Job: [id number]

## 2011-02-08 NOTE — H&P (Signed)
[facility]  Patient:    Sarah Benton, Sarah Benton                      MRN: [id number] Adm. Date:  08/20/00 Attending:  Malachi Pro. Ambrose Mantle, M.D.                         History and Physical  HISTORY OF PRESENT ILLNESS:  This is a 65 year old white female, para 2-0-2, who is admitted for abdominal hysterectomy and bilateral salpingo-oophorectomy because of left lower abdominal pain, persistent left ovarian cyst that has actually grown.  The patients last period was in the spring of 2000.  She did undergo a D&C and hysteroscopy with resection of an endometrial polyp in November of 2000.  The patients mother apparently died from ovarian cancer and in the past few months the patient has had left lower abdominal pain especially with sitting or lying down.  She has had multiple ultrasound evaluations of the left ovary which has shown a persistent 3 to 4 cm left ovarian cyst and apparently is simple without internal echos and very low risk for cancer.  Because of the persistence of the left ovarian cyst, persistent left lower abdominal pain, the patient is admitted for abdominal hysterectomy and bilateral salpingo-oophorectomy.  ALLERGIES:  SEPTRA, caused nausea.  CURRENT MEDICATIONS:  She is not taking any current medications.  PAST MEDICAL HISTORY:  She has had no major significant illnesses.  She has had no heart problems.  She does not drink nor smoke.  REVIEW OF SYSTEMS:  Negative.  PAST SURGICAL HISTORY:  She did have a lumbar laminectomy in 1998, a left breast biopsy in 1993, a tubal ligation in 1979.  FAMILY HISTORY:  Mother died at 49 of Parkinsons and ovarian cancer.  Father died at 7 with high blood pressure.  One sister is living.  No brothers.  The patient is retired as a Research officer, trade union.  PHYSICAL EXAMINATION:  GENERAL:  Physical examination reveals a quite obese white female, weight 223 pounds.  VITAL SIGNS:  Blood pressure 150/100.  HEENT:   Reveal no cranial abnormalities.  Extraocular movements are intact. Nose and pharynx clear.  NECK:  Supple without thyromegaly.  HEART:  Normal size and sounds.  No murmurs.  LUNGS:  Clear to P&A.  There is a left thyroid nodule about 2 cm.  BREASTS:  Soft without masses, sitting down and lying down.  ABDOMEN:  Soft and obese.  No masses or tenderness are felt.  Liver, spleen, or kidney are not felt.  GENITALIA:  Vulva and vagina are clean.  The cervix is clear.  There is stenosis of the cervix.  The Pap smear is normal in September of 2001.  The uterus is anterior, upper limits of normal size.  Adnexa are clear.  Recent rectal examination was negative.  The patient has undergone a recent lower GI evaluation which showed no signs of cause for the left lower abdominal pain.  ADMITTING IMPRESSION:  Chronic left lower abdominal pain, left ovarian cyst, family history of ovarian cancer.  The patient is admitted for abdominal hysterectomy and bilateral salpingo-oophorectomy.  She has been counseled about the risks of surgery including but not limited to heart attack, stroke, pulmonary embolus, wound disruption, hemorrhage with need for reoperation and/or transfusion, fistula formation, nerve injury, intestinal obstruction. She understands and agrees to proceed. DD:  08/19/00 TD:  08/20/00 Job: [id number]

## 2011-06-20 ENCOUNTER — Other Ambulatory Visit: Payer: Self-pay | Admitting: Family Medicine

## 2011-06-20 DIAGNOSIS — Z1231 Encounter for screening mammogram for malignant neoplasm of breast: Secondary | ICD-10-CM

## 2011-07-30 ENCOUNTER — Ambulatory Visit
Admission: RE | Admit: 2011-07-30 | Discharge: 2011-07-30 | Disposition: A | Discharge status: Home / Self Care | Payer: Medicare Other | Source: Ambulatory Visit | Attending: Family Medicine | Admitting: Family Medicine

## 2011-07-30 DIAGNOSIS — Z1231 Encounter for screening mammogram for malignant neoplasm of breast: Secondary | ICD-10-CM

## 2012-04-01 ENCOUNTER — Telehealth: Payer: Self-pay | Admitting: Gastroenterology

## 2012-04-01 NOTE — Telephone Encounter (Signed)
Patient hgb at her primary care was 10.4 and she was heme positive.  She will bring the records with her to the appt.  She is scheduled to see Willette Cluster RNP at 1:30 04/06/12

## 2012-04-06 ENCOUNTER — Encounter: Payer: Self-pay | Admitting: Nurse Practitioner

## 2012-04-06 ENCOUNTER — Other Ambulatory Visit (INDEPENDENT_AMBULATORY_CARE_PROVIDER_SITE_OTHER): Payer: Medicare Other

## 2012-04-06 ENCOUNTER — Ambulatory Visit (INDEPENDENT_AMBULATORY_CARE_PROVIDER_SITE_OTHER): Payer: Medicare Other | Admitting: Nurse Practitioner

## 2012-04-06 VITALS — BP 134/72 | HR 60 | Ht 63.0 in | Wt 215.0 lb

## 2012-04-06 DIAGNOSIS — D649 Anemia, unspecified: Secondary | ICD-10-CM

## 2012-04-06 DIAGNOSIS — R195 Other fecal abnormalities: Secondary | ICD-10-CM

## 2012-04-06 DIAGNOSIS — Z79899 Other long term (current) drug therapy: Secondary | ICD-10-CM

## 2012-04-06 LAB — CBC WITH DIFFERENTIAL/PLATELET
Basophils Absolute: 0.1 10*3/uL (ref 0.0–0.1)
Basophils Relative: 0.6 % (ref 0.0–3.0)
Eosinophils Relative: 1.7 % (ref 0.0–5.0)
Hemoglobin: 11.3 g/dL — ABNORMAL LOW (ref 12.0–15.0)
Lymphocytes Relative: 17.8 % (ref 12.0–46.0)
Monocytes Absolute: 0.5 10*3/uL (ref 0.1–1.0)
Neutrophils Relative %: 74.7 % (ref 43.0–77.0)
RDW: 14.6 % (ref 11.5–14.6)
WBC: 9.5 10*3/uL (ref 4.5–10.5)

## 2012-04-06 LAB — FERRITIN: Ferritin: 81.4 ng/mL (ref 10.0–291.0)

## 2012-04-06 LAB — VITAMIN B12: Vitamin B-12: 205 pg/mL — ABNORMAL LOW (ref 211–911)

## 2012-04-06 LAB — IBC PANEL: Transferrin: 226.9 mg/dL (ref 212.0–360.0)

## 2012-04-06 NOTE — Patient Instructions (Addendum)
Please go to the basement level to have your labs drawn.  On 7-25 you will come to our lab in the basement anytime between 7;30 and 5:30 PM.  We sent a prescription to [facility] for suppositories.

## 2012-04-06 NOTE — Progress Notes (Signed)
Sarah Benton [id number] 1946-05-23   HISTORY OR PRESENT ILLNESS :   Patient is a 66 year old female known to Dr. Russella Dar for history of gastritis and GERD with peptic stricture (2012). She had a normal screening colonoscopy September 2009.  Patient has been referred by her primary care provider for evaluation of anemia and positive IFOB testing. Patient is under evaluation for numbness/tingling of her extremities. In the process of workup, labs revealed mild normocytic with hemoglobin of 11.2. PCP has initiated oral iron therapy. Patient denies overt gastrointestinal bleeding, no black stools. No abdominal pain, no bowel changes. She has taken Mobic for two years and has taken daily Prilosec since esophageal dilation in 2012.   Current Medications, Allergies, Past Medical History, Past Surgical History, Family History and Social History were reviewed in [employer] record.   PHYSICAL EXAMINATION : General:  Well developed  female in no acute distress Head: Normocephalic and atraumatic Eyes:  sclerae anicteric,conjunctive pink. Ears: Normal auditory acuity Neck: Supple, no masses.  Lungs: Clear throughout to auscultation Heart: Regular rate and rhythm; no murmurs heard Abdomen: Soft, nondistended, nontender. No masses or hepatomegaly noted. Normal bowel sounds Rectal: No external lesions. On anoscopy there were mildly inflamed internal hemorrhoids. Musculoskeletal: Symmetrical with no gross deformities  Skin: No lesions on visible extremities Extremities: No edema or deformities noted Neurological: Oriented x 4, grossly nonfocal Cervical Nodes:  No significant cervical adenopathy Psychological:  Alert and cooperative. Normal mood and affect  ASSESSMENT AND PLAN :   mild normocytic anemia , positive IFOB. Hemoglobin 11 point 2 on 03/31/12, I do not have a baseline. No overt bleeding. No abdominal symptoms. Patient has internal hemorrhoids on exam, possibly source  of +IFOB. She is up-to-date on her colonoscopy, last one was September 2009. The extent of the exam was to the cecum , prep was excellent, no cancers, polyps or other abnormalities. Family medical history negative for colon cancer. I will check iron studies, (patient started iron last week). Will treat internal hemorrhoids then recheck IFOB and a CBC. We will call patient with test results and further recommendations based on those results

## 2012-04-07 ENCOUNTER — Telehealth: Payer: Self-pay | Admitting: Gastroenterology

## 2012-04-07 NOTE — Progress Notes (Signed)
Reviewed and agree with management. Amiah Frohlich D. Randeep Biondolillo, M.D., FACG  

## 2012-04-08 ENCOUNTER — Telehealth: Payer: Self-pay | Admitting: *Deleted

## 2012-04-08 NOTE — Telephone Encounter (Signed)
Another phone note was created. Questions were answered for pt

## 2012-04-08 NOTE — Telephone Encounter (Signed)
I called the patient and she told me she was confused about the IFOB Hemoccult cards.  It showed it twice on her patient instructions I gave her with her office visit information when she saw [place].  I apologized and told her she doesn't needs to do the hemoccult cards until she is done with the 10 days of Anusol HC Suppositories.  I gave her the date of 04-16-2012.  She thanked me for clearing that up.

## 2012-04-16 ENCOUNTER — Other Ambulatory Visit (INDEPENDENT_AMBULATORY_CARE_PROVIDER_SITE_OTHER): Payer: Medicare Other

## 2012-04-16 ENCOUNTER — Other Ambulatory Visit: Payer: Medicare Other

## 2012-04-16 ENCOUNTER — Other Ambulatory Visit: Payer: Self-pay | Admitting: *Deleted

## 2012-04-16 DIAGNOSIS — D649 Anemia, unspecified: Secondary | ICD-10-CM

## 2012-04-16 DIAGNOSIS — R195 Other fecal abnormalities: Secondary | ICD-10-CM

## 2012-04-16 LAB — CBC WITH DIFFERENTIAL/PLATELET
Basophils Absolute: 0 10*3/uL (ref 0.0–0.1)
Eosinophils Relative: 1.7 % (ref 0.0–5.0)
HCT: 36.1 % (ref 36.0–46.0)
MCHC: 32.8 g/dL (ref 30.0–36.0)
MCV: 85.9 fl (ref 78.0–100.0)
Monocytes Absolute: 0.5 10*3/uL (ref 0.1–1.0)
Monocytes Relative: 5.2 % (ref 3.0–12.0)
Platelets: 249 10*3/uL (ref 150.0–400.0)
RBC: 4.2 Mil/uL (ref 3.87–5.11)
RDW: 14.1 % (ref 11.5–14.6)
WBC: 8.7 10*3/uL (ref 4.5–10.5)

## 2012-04-24 ENCOUNTER — Other Ambulatory Visit: Payer: Self-pay | Admitting: *Deleted

## 2012-04-24 DIAGNOSIS — K921 Melena: Secondary | ICD-10-CM

## 2012-06-01 ENCOUNTER — Encounter: Payer: Self-pay | Admitting: *Deleted

## 2012-06-01 NOTE — Telephone Encounter (Signed)
error 

## 2012-06-02 ENCOUNTER — Ambulatory Visit (AMBULATORY_SURGERY_CENTER): Payer: Medicare Other | Admitting: *Deleted

## 2012-06-02 VITALS — Ht 63.5 in | Wt 217.0 lb

## 2012-06-02 DIAGNOSIS — R195 Other fecal abnormalities: Secondary | ICD-10-CM

## 2012-06-02 DIAGNOSIS — Z1211 Encounter for screening for malignant neoplasm of colon: Secondary | ICD-10-CM

## 2012-06-02 MED ORDER — MOVIPREP 100 G PO SOLR: ORAL | Status: DC

## 2012-06-09 ENCOUNTER — Encounter: Payer: Self-pay | Admitting: Gastroenterology

## 2012-06-09 ENCOUNTER — Ambulatory Visit (AMBULATORY_SURGERY_CENTER): Payer: Medicare Other | Admitting: Gastroenterology

## 2012-06-09 ENCOUNTER — Encounter: Payer: Medicare Other | Admitting: Gastroenterology

## 2012-06-09 VITALS — BP 140/78 | HR 81 | Temp 98.9°F | Resp 18 | Ht 63.0 in | Wt 217.0 lb

## 2012-06-09 DIAGNOSIS — K5289 Other specified noninfective gastroenteritis and colitis: Secondary | ICD-10-CM

## 2012-06-09 DIAGNOSIS — K633 Ulcer of intestine: Secondary | ICD-10-CM

## 2012-06-09 DIAGNOSIS — K6389 Other specified diseases of intestine: Secondary | ICD-10-CM

## 2012-06-09 DIAGNOSIS — R195 Other fecal abnormalities: Secondary | ICD-10-CM

## 2012-06-09 DIAGNOSIS — Z1211 Encounter for screening for malignant neoplasm of colon: Secondary | ICD-10-CM

## 2012-06-09 MED ORDER — SODIUM CHLORIDE 0.9 % IV SOLN: 500.0000 mL | INTRAVENOUS | Status: DC

## 2012-06-09 NOTE — Progress Notes (Signed)
The pt tolerated the colonoscopy very well. Maw   

## 2012-06-09 NOTE — Progress Notes (Signed)
Patient did not experience any of the following events: a burn prior to discharge; a fall within the facility; wrong site/side/patient/procedure/implant event; or a hospital transfer or hospital admission upon discharge from the facility. (G8907) Patient did not have preoperative order for IV antibiotic SSI prophylaxis. (G8918)  

## 2012-06-09 NOTE — Patient Instructions (Addendum)

## 2012-06-09 NOTE — Op Note (Addendum)
[facility] [address] [place], [address]   COLONOSCOPY PROCEDURE REPORT  PATIENT: Sarah, Benton  MR#: [id number] BIRTHDATE: [birth year] , 66  yrs. old GENDER: Female ENDOSCOPIST: Meryl Dare, MD, [facility]  PROCEDURE DATE:  06/09/2012 PROCEDURE:   Colonoscopy with biopsies ASA CLASS:   Class II INDICATIONS:heme-positive stool. MEDICATIONS: MAC sedation, administered by CRNA and propofol (Diprivan) 250mg  IV  DESCRIPTION OF PROCEDURE:   After the risks benefits and alternatives of the procedure were thoroughly explained, informed consent was obtained.  A digital rectal exam revealed no abnormalities of the rectum.   The LB [id number]  endoscope was introduced through the anus and advanced to the cecum, which was identified by both the appendix and ileocecal valve. No adverse events experienced.   The quality of the prep was excellent, using MoviPrep  The instrument was then slowly withdrawn as the colon was fully examined.   COLON FINDINGS: Erosions were found at the appendiceal orifice. Mild and nonspecific.  Multiple biopsies of the area were performed. The colon was otherwise normal.  There was no diverticulosis, inflammation, polyps or cancers unless previously stated. Retroflexed views revealed small internal hemorrhoids. The time to cecum=3 minutes 32 seconds.  Withdrawal time=8 minutes 43 seconds. The scope was withdrawn and the procedure completed. COMPLICATIONS: There were no complications.  ENDOSCOPIC IMPRESSION: 1.   Erosions at the appendiceal orifice; multiple biopsies 2.   Internal hemorrhoids  RECOMMENDATIONS: 1.  Await pathology results 2.  Continue current colorectal screening recommendations for "routine risk" patients with a repeat colonoscopy in 10 years.   eSigned:  Meryl Dare, MD, [facility] 06/09/2012 10:28 AM Revised: 06/09/2012 10:28 AM  cc: Bennie Pierini, NP

## 2012-06-10 ENCOUNTER — Telehealth: Payer: Self-pay

## 2012-06-10 NOTE — Telephone Encounter (Signed)
  Follow up Call-  Call back number 06/09/2012  Post procedure Call Back phone  # [phone]  Permission to leave phone message Yes     Patient questions:  Do you have a fever, pain , or abdominal swelling? no Pain Score  0 *  Have you tolerated food without any problems? yes  Have you been able to return to your normal activities? yes  Do you have any questions about your discharge instructions: Diet   no Medications  no Follow up visit  no  Do you have questions or concerns about your Care? no  Actions: * If pain score is 4 or above: No action needed, pain <4.  Per the pt, "no problems at all". Maw

## 2012-06-15 ENCOUNTER — Encounter: Payer: Self-pay | Admitting: Gastroenterology

## 2012-06-30 ENCOUNTER — Other Ambulatory Visit: Payer: Self-pay | Admitting: Obstetrics and Gynecology

## 2012-06-30 DIAGNOSIS — Z1231 Encounter for screening mammogram for malignant neoplasm of breast: Secondary | ICD-10-CM

## 2012-08-04 ENCOUNTER — Ambulatory Visit
Admission: RE | Admit: 2012-08-04 | Discharge: 2012-08-04 | Disposition: A | Discharge status: Home / Self Care | Payer: Medicare Other | Source: Ambulatory Visit | Attending: Obstetrics and Gynecology | Admitting: Obstetrics and Gynecology

## 2012-08-04 DIAGNOSIS — Z1231 Encounter for screening mammogram for malignant neoplasm of breast: Secondary | ICD-10-CM

## 2012-12-07 ENCOUNTER — Other Ambulatory Visit: Payer: Self-pay | Admitting: Nurse Practitioner

## 2012-12-07 LAB — ANEMIA PANEL 7
%SAT: 16 % — ABNORMAL LOW (ref 20–55)
Ferritin: 131 ng/mL (ref 10–291)
Folate: 6 ng/mL
HCT: 38.4 % (ref 36.0–46.0)
MCHC: 33.1 g/dL (ref 30.0–36.0)
RBC.: 4.54 MIL/uL (ref 3.87–5.11)
RDW: 13.7 % (ref 11.5–15.5)
TIBC: 293 ug/dL (ref 250–470)
Vitamin B-12: 310 pg/mL (ref 211–911)

## 2012-12-07 LAB — COMPLETE METABOLIC PANEL WITH GFR
Albumin: 4.1 g/dL (ref 3.5–5.2)
Alkaline Phosphatase: 111 U/L (ref 39–117)
CO2: 27 mEq/L (ref 19–32)
GFR, Est Non African American: 57 mL/min — ABNORMAL LOW
Glucose, Bld: 100 mg/dL — ABNORMAL HIGH (ref 70–99)
Potassium: 4.7 mEq/L (ref 3.5–5.3)
Sodium: 141 mEq/L (ref 135–145)
Total Protein: 6.4 g/dL (ref 6.0–8.3)

## 2012-12-10 LAB — NMR LIPOPROFILE WITH LIPIDS
HDL Size: 8.8 nm — ABNORMAL LOW (ref >=9.2)
HDL-C: 49 mg/dL (ref >=40)
LDL Size: 21 nm (ref >20.5)
Large HDL-P: 4.7 umol/L — ABNORMAL LOW (ref >=4.8)
Large VLDL-P: 1.9 nmol/L (ref <=2.7)
Small LDL Particle Number: 725 nmol/L — ABNORMAL HIGH (ref <=527)

## 2012-12-18 ENCOUNTER — Telehealth: Payer: Self-pay | Admitting: Nurse Practitioner

## 2012-12-19 NOTE — Telephone Encounter (Signed)
Pt. Is requesting her Mobic, her last ov note on 12/07/12 says to hold. Please advise pharmacy and pt.

## 2012-12-21 ENCOUNTER — Other Ambulatory Visit: Payer: Self-pay | Admitting: Nurse Practitioner

## 2012-12-21 MED ORDER — MELOXICAM 15 MG PO TABS: 15.0000 mg | ORAL_TABLET | Freq: Every day | ORAL | Status: DC

## 2012-12-21 NOTE — Telephone Encounter (Signed)
rx sent into pharmacy

## 2012-12-21 NOTE — Telephone Encounter (Signed)
Sent Rx in for [employer] for [employer]. Creatine was [facility]

## 2012-12-23 ENCOUNTER — Other Ambulatory Visit: Payer: Self-pay | Admitting: Nurse Practitioner

## 2013-01-01 ENCOUNTER — Telehealth: Payer: Self-pay | Admitting: Nurse Practitioner

## 2013-01-01 NOTE — Telephone Encounter (Signed)
C/o diarrhea. No vomiting, no fever, no abd pain. Advised to use Immodium OTC for loose stools, no caffeine in diet. Continue to drink liquids to stay hydrated. If symptoms don't improve by late this afternoon call back.

## 2013-03-22 ENCOUNTER — Other Ambulatory Visit: Payer: Self-pay | Admitting: *Deleted

## 2013-03-22 MED ORDER — MELOXICAM 15 MG PO TABS: 15.0000 mg | ORAL_TABLET | Freq: Every day | ORAL | Status: DC

## 2013-03-22 NOTE — Telephone Encounter (Signed)
LAST OV 3/14

## 2013-04-12 ENCOUNTER — Other Ambulatory Visit: Payer: Self-pay | Admitting: Obstetrics and Gynecology

## 2013-04-13 ENCOUNTER — Other Ambulatory Visit: Payer: Self-pay | Admitting: Obstetrics and Gynecology

## 2013-04-13 DIAGNOSIS — N644 Mastodynia: Secondary | ICD-10-CM

## 2013-04-27 ENCOUNTER — Ambulatory Visit
Admission: RE | Admit: 2013-04-27 | Discharge: 2013-04-27 | Disposition: A | Discharge status: Home / Self Care | Payer: Medicare Other | Source: Ambulatory Visit | Attending: Obstetrics and Gynecology | Admitting: Obstetrics and Gynecology

## 2013-04-27 DIAGNOSIS — N644 Mastodynia: Secondary | ICD-10-CM

## 2013-05-04 ENCOUNTER — Ambulatory Visit (INDEPENDENT_AMBULATORY_CARE_PROVIDER_SITE_OTHER): Payer: PRIVATE HEALTH INSURANCE | Admitting: Nurse Practitioner

## 2013-05-04 ENCOUNTER — Encounter: Payer: Self-pay | Admitting: Nurse Practitioner

## 2013-05-04 ENCOUNTER — Telehealth: Payer: Self-pay | Admitting: Nurse Practitioner

## 2013-05-04 VITALS — BP 136/69 | HR 56 | Temp 99.3°F | Ht 63.0 in | Wt 228.0 lb

## 2013-05-04 DIAGNOSIS — B029 Zoster without complications: Secondary | ICD-10-CM

## 2013-05-04 MED ORDER — METHYLPREDNISOLONE ACETATE 80 MG/ML IJ SUSP
80.0000 mg | Freq: Once | INTRAMUSCULAR | Status: AC
  Administered 2013-05-04: 80 mg via INTRAMUSCULAR

## 2013-05-04 MED ORDER — GABAPENTIN 300 MG PO CAPS: 300.0000 mg | ORAL_CAPSULE | Freq: Three times a day (TID) | ORAL | Status: DC

## 2013-05-04 MED ORDER — VALACYCLOVIR HCL 1 G PO TABS: 1000.0000 mg | ORAL_TABLET | Freq: Three times a day (TID) | ORAL | Status: DC

## 2013-05-04 NOTE — Progress Notes (Signed)
  Subjective:    Patient ID: Sarah Benton, female    DOB: 1945/11/28, 67 y.o.   MRN: [id number]  HPI  Patient in C/o left shoulder blade pain and radiates all the way around to breast- She has had the pain for a month and it is not getting any better. Went to see ortho this morning and they think it may be shingles without a rash.     Review of Systems  Constitutional: Negative.   HENT: Negative.   Eyes: Negative.   Respiratory: Negative.  Negative for shortness of breath.   Cardiovascular: Negative.  Negative for chest pain.  Genitourinary: Negative.   Musculoskeletal: Negative.   Allergic/Immunologic: Negative.   Neurological: Negative.   Hematological: Negative.   Psychiatric/Behavioral: Negative.        Objective:   Physical Exam  Constitutional: She appears well-developed and well-nourished.  Cardiovascular: Normal rate and normal heart sounds.   Pulmonary/Chest: Effort normal and breath sounds normal.  Musculoskeletal:  Pain on palpation left shoulder blade and around  Under left breast.   Skin: Skin is warm and dry. No rash noted.    BP 136/69  Pulse 56  Temp(Src) 99.3 F (37.4 C) (Oral)  Ht 5\' 3"  (1.6 m)  Wt 228 lb (103.42 kg)  BMI 40.4 kg/m2       Assessment & Plan:  1. Shingles no rash Moist heat if helps RTO prn - Varicella Zoster Abs, IgG/IgM - valACYclovir (VALTREX) 1000 MG tablet; Take 1 tablet (1,000 mg total) by mouth 3 (three) times daily.  Dispense: 21 tablet; Refill: 0 - methylPREDNISolone acetate (DEPO-MEDROL) injection 80 mg; Inject 1 mL (80 mg total) into the muscle once. - gabapentin (NEURONTIN) 300 MG capsule; Take 1 capsule (300 mg total) by mouth 3 (three) times daily.  Dispense: 90 capsule; Refill: 3  Mary-Margaret Daphine Deutscher, FNP

## 2013-05-04 NOTE — Telephone Encounter (Signed)
APPT MADE

## 2013-05-04 NOTE — Patient Instructions (Addendum)
Shingles Shingles (herpes zoster) is an infection that is caused by the same virus that causes chickenpox (varicella). The infection causes a painful skin rash and fluid-filled blisters, which eventually break open, crust over, and heal. It may occur in any area of the body, but it usually affects only one side of the body or face. The pain of shingles usually lasts about 1 month. However, some people with shingles may develop long-term (chronic) pain in the affected area of the body. Shingles often occurs many years after the person had chickenpox. It is more common:  In people older than 50 years.  In people with weakened immune systems, such as those with HIV, AIDS, or cancer.  In people taking medicines that weaken the immune system, such as transplant medicines.  In people under great stress. CAUSES  Shingles is caused by the varicella zoster virus (VZV), which also causes chickenpox. After a person is infected with the virus, it can remain in the person's body for years in an inactive state (dormant). To cause shingles, the virus reactivates and breaks out as an infection in a nerve root. The virus can be spread from person to person (contagious) through contact with open blisters of the shingles rash. It will only spread to people who have not had chickenpox. When these people are exposed to the virus, they may develop chickenpox. They will not develop shingles. Once the blisters scab over, the person is no longer contagious and cannot spread the virus to others. SYMPTOMS  Shingles shows up in stages. The initial symptoms may be pain, itching, and tingling in an area of the skin. This pain is usually described as burning, stabbing, or throbbing.In a few days or weeks, a painful red rash will appear in the area where the pain, itching, and tingling were felt. The rash is usually on one side of the body in a band or belt-like pattern. Then, the rash usually turns into fluid-filled blisters. They  will scab over and dry up in approximately 2 3 weeks. Flu-like symptoms may also occur with the initial symptoms, the rash, or the blisters. These may include:  Fever.  Chills.  Headache.  Upset stomach. DIAGNOSIS  Your caregiver will perform a skin exam to diagnose shingles. Skin scrapings or fluid samples may also be taken from the blisters. This sample will be examined under a microscope or sent to a lab for further testing. TREATMENT  There is no specific cure for shingles. Your caregiver will likely prescribe medicines to help you manage the pain, recover faster, and avoid long-term problems. This may include antiviral drugs, anti-inflammatory drugs, and pain medicines. HOME CARE INSTRUCTIONS   Take a cool bath or apply cool compresses to the area of the rash or blisters as directed. This may help with the pain and itching.   Only take over-the-counter or prescription medicines as directed by your caregiver.   Rest as directed by your caregiver.  Keep your rash and blisters clean with mild soap and cool water or as directed by your caregiver.  Do not pick your blisters or scratch your rash. Apply an anti-itch cream or numbing creams to the affected area as directed by your caregiver.  Keep your shingles rash covered with a loose bandage (dressing).  Avoid skin contact with:  Babies.   Pregnant women.   Children with eczema.   Elderly people with transplants.   People with chronic illnesses, such as leukemia or AIDS.   Wear loose-fitting clothing to help ease   the pain of material rubbing against the rash.  Keep all follow-up appointments with your caregiver.If the area involved is on your face, you may receive a referral for follow-up to a specialist, such as an eye doctor (ophthalmologist) or an ear, nose, and throat (ENT) doctor. Keeping all follow-up appointments will help you avoid eye complications, chronic pain, or disability.  SEEK IMMEDIATE MEDICAL  CARE IF:   You have facial pain, pain around the eye area, or loss of feeling on one side of your face.  You have ear pain or ringing in your ear.  You have loss of taste.  Your pain is not relieved with prescribed medicines.   Your redness or swelling spreads.   You have more pain and swelling.  Your condition is worsening or has changed.   You have a feveror persistent symptoms for more than 2 3 days.  You have a fever and your symptoms suddenly get worse. MAKE SURE YOU:  Understand these instructions.  Will watch your condition.  Will get help right away if you are not doing well or get worse. Document Released: 09/09/2005 Document Revised: 06/03/2012 Document Reviewed: 04/23/2012 [employer] Patient Information 2014 [employer].  

## 2013-05-05 LAB — VARICELLA ZOSTER ABS, IGG/IGM: Varicella IgM: <0.91 index (ref 0.00–0.90)

## 2013-06-12 ENCOUNTER — Emergency Department (HOSPITAL_COMMUNITY): Payer: Medicare Other

## 2013-06-12 ENCOUNTER — Encounter (HOSPITAL_COMMUNITY): Payer: Self-pay | Admitting: Emergency Medicine

## 2013-06-12 ENCOUNTER — Observation Stay (HOSPITAL_COMMUNITY)
Admission: EM | Admit: 2013-06-12 | Discharge: 2013-06-15 | Disposition: A | Discharge status: Home / Self Care | Payer: Medicare Other | Attending: Internal Medicine | Admitting: Internal Medicine

## 2013-06-12 DIAGNOSIS — K219 Gastro-esophageal reflux disease without esophagitis: Secondary | ICD-10-CM | POA: Diagnosis present

## 2013-06-12 DIAGNOSIS — M25519 Pain in unspecified shoulder: Secondary | ICD-10-CM | POA: Insufficient documentation

## 2013-06-12 DIAGNOSIS — R509 Fever, unspecified: Secondary | ICD-10-CM

## 2013-06-12 DIAGNOSIS — E7800 Pure hypercholesterolemia, unspecified: Secondary | ICD-10-CM | POA: Diagnosis present

## 2013-06-12 DIAGNOSIS — M546 Pain in thoracic spine: Secondary | ICD-10-CM | POA: Diagnosis present

## 2013-06-12 DIAGNOSIS — E785 Hyperlipidemia, unspecified: Secondary | ICD-10-CM | POA: Insufficient documentation

## 2013-06-12 DIAGNOSIS — I10 Essential (primary) hypertension: Secondary | ICD-10-CM | POA: Diagnosis present

## 2013-06-12 DIAGNOSIS — R61 Generalized hyperhidrosis: Secondary | ICD-10-CM | POA: Insufficient documentation

## 2013-06-12 DIAGNOSIS — R079 Chest pain, unspecified: Principal | ICD-10-CM | POA: Insufficient documentation

## 2013-06-12 DIAGNOSIS — I517 Cardiomegaly: Secondary | ICD-10-CM | POA: Insufficient documentation

## 2013-06-12 DIAGNOSIS — M549 Dorsalgia, unspecified: Secondary | ICD-10-CM

## 2013-06-12 LAB — COMPREHENSIVE METABOLIC PANEL
Alkaline Phosphatase: 116 U/L (ref 39–117)
BUN: 23 mg/dL (ref 6–23)
GFR calc Af Amer: 61 mL/min — ABNORMAL LOW (ref >90)
Glucose, Bld: 157 mg/dL — ABNORMAL HIGH (ref 70–99)
Potassium: 4.4 mEq/L (ref 3.5–5.1)
Total Bilirubin: 0.3 mg/dL (ref 0.3–1.2)
Total Protein: 6.5 g/dL (ref 6.0–8.3)

## 2013-06-12 LAB — CBC
HCT: 35.5 % — ABNORMAL LOW (ref 36.0–46.0)
Hemoglobin: 11.9 g/dL — ABNORMAL LOW (ref 12.0–15.0)
MCH: 28.4 pg (ref 26.0–34.0)
MCHC: 33.5 g/dL (ref 30.0–36.0)

## 2013-06-12 LAB — TROPONIN I
Troponin I: <0.3 ng/mL (ref <0.30)
Troponin I: <0.3 ng/mL (ref <0.30)

## 2013-06-12 LAB — POCT I-STAT TROPONIN I: Troponin i, poc: 0 ng/mL (ref 0.00–0.08)

## 2013-06-12 MED ORDER — AMITRIPTYLINE HCL 25 MG PO TABS
25.0000 mg | ORAL_TABLET | Freq: Every day | ORAL | Status: DC
  Administered 2013-06-12 – 2013-06-14 (×3): 25 mg via ORAL
  Filled 2013-06-12 (×4): qty 1

## 2013-06-12 MED ORDER — HYDROMORPHONE HCL PF 1 MG/ML IJ SOLN
1.0000 mg | INTRAMUSCULAR | Status: DC | PRN
  Administered 2013-06-12: 1 mg via INTRAVENOUS
  Filled 2013-06-12: qty 1

## 2013-06-12 MED ORDER — NITROGLYCERIN IN D5W 200-5 MCG/ML-% IV SOLN
INTRAVENOUS | Status: AC
  Filled 2013-06-12: qty 250

## 2013-06-12 MED ORDER — HYDROMORPHONE HCL PF 1 MG/ML IJ SOLN: 1.0000 mg | INTRAMUSCULAR | Status: DC | PRN

## 2013-06-12 MED ORDER — ACYCLOVIR 200 MG PO CAPS
800.0000 mg | ORAL_CAPSULE | Freq: Every day | ORAL | Status: DC
  Filled 2013-06-12 (×5): qty 4

## 2013-06-12 MED ORDER — HYDROMORPHONE HCL PF 2 MG/ML IJ SOLN: 2.0000 mg | Freq: Once | INTRAMUSCULAR | Status: DC

## 2013-06-12 MED ORDER — GI COCKTAIL ~~LOC~~
30.0000 mL | Freq: Once | ORAL | Status: AC
  Administered 2013-06-12: 30 mL via ORAL
  Filled 2013-06-12: qty 30

## 2013-06-12 MED ORDER — PANTOPRAZOLE SODIUM 40 MG PO TBEC
40.0000 mg | DELAYED_RELEASE_TABLET | Freq: Every day | ORAL | Status: DC
  Administered 2013-06-12 – 2013-06-15 (×4): 40 mg via ORAL
  Filled 2013-06-12 (×4): qty 1

## 2013-06-12 MED ORDER — FENTANYL CITRATE 0.05 MG/ML IJ SOLN
INTRAMUSCULAR | Status: AC
  Filled 2013-06-12: qty 2

## 2013-06-12 MED ORDER — ENOXAPARIN SODIUM 40 MG/0.4ML ~~LOC~~ SOLN
40.0000 mg | Freq: Every day | SUBCUTANEOUS | Status: DC
  Administered 2013-06-12 – 2013-06-15 (×4): 40 mg via SUBCUTANEOUS
  Filled 2013-06-12 (×4): qty 0.4

## 2013-06-12 MED ORDER — SODIUM CHLORIDE 0.9 % IJ SOLN
3.0000 mL | Freq: Two times a day (BID) | INTRAMUSCULAR | Status: DC
  Administered 2013-06-12 – 2013-06-14 (×6): 3 mL via INTRAVENOUS
  Administered 2013-06-14: 6 mL via INTRAVENOUS
  Administered 2013-06-15: 3 mL via INTRAVENOUS

## 2013-06-12 MED ORDER — NITROGLYCERIN 0.4 MG SL SUBL
0.4000 mg | SUBLINGUAL_TABLET | SUBLINGUAL | Status: DC | PRN
  Administered 2013-06-12: 0.4 mg via SUBLINGUAL

## 2013-06-12 MED ORDER — ACYCLOVIR 800 MG PO TABS
800.0000 mg | ORAL_TABLET | Freq: Every day | ORAL | Status: DC
  Administered 2013-06-12 – 2013-06-13 (×5): 800 mg via ORAL
  Filled 2013-06-12 (×10): qty 1

## 2013-06-12 MED ORDER — NITROGLYCERIN IN D5W 200-5 MCG/ML-% IV SOLN
5.0000 ug/min | Freq: Once | INTRAVENOUS | Status: AC
  Administered 2013-06-12: 5 ug/min via INTRAVENOUS

## 2013-06-12 MED ORDER — ONDANSETRON HCL 4 MG PO TABS: 4.0000 mg | ORAL_TABLET | Freq: Four times a day (QID) | ORAL | Status: DC | PRN

## 2013-06-12 MED ORDER — SODIUM CHLORIDE 0.9 % IV SOLN
INTRAVENOUS | Status: DC
  Administered 2013-06-12: 03:00:00 via INTRAVENOUS

## 2013-06-12 MED ORDER — ATORVASTATIN CALCIUM 40 MG PO TABS
40.0000 mg | ORAL_TABLET | Freq: Every day | ORAL | Status: DC
  Filled 2013-06-12 (×4): qty 1

## 2013-06-12 MED ORDER — ONDANSETRON HCL 4 MG/2ML IJ SOLN
4.0000 mg | Freq: Four times a day (QID) | INTRAMUSCULAR | Status: DC | PRN
  Administered 2013-06-13: 4 mg via INTRAVENOUS
  Filled 2013-06-12: qty 2

## 2013-06-12 MED ORDER — NITROGLYCERIN 0.4 MG SL SUBL: 0.4000 mg | SUBLINGUAL_TABLET | SUBLINGUAL | Status: DC | PRN

## 2013-06-12 MED ORDER — MORPHINE SULFATE 4 MG/ML IJ SOLN
4.0000 mg | INTRAMUSCULAR | Status: DC | PRN
  Administered 2013-06-12 – 2013-06-13 (×2): 4 mg via INTRAVENOUS
  Filled 2013-06-12 (×2): qty 1

## 2013-06-12 MED ORDER — FENTANYL CITRATE 0.05 MG/ML IJ SOLN
50.0000 ug | INTRAMUSCULAR | Status: AC | PRN
  Administered 2013-06-12 (×2): 50 ug via INTRAVENOUS
  Filled 2013-06-12 (×2): qty 2

## 2013-06-12 MED ORDER — ONDANSETRON HCL 4 MG/2ML IJ SOLN
4.0000 mg | INTRAMUSCULAR | Status: DC | PRN
  Administered 2013-06-12: 4 mg via INTRAVENOUS
  Filled 2013-06-12: qty 2

## 2013-06-12 MED ORDER — FENTANYL CITRATE 0.05 MG/ML IJ SOLN
50.0000 ug | INTRAMUSCULAR | Status: DC | PRN
  Administered 2013-06-12: 50 ug via INTRAVENOUS

## 2013-06-12 MED ORDER — IOHEXOL 300 MG/ML  SOLN
75.0000 mL | Freq: Once | INTRAMUSCULAR | Status: AC | PRN
  Administered 2013-06-12: 75 mL via INTRAVENOUS

## 2013-06-12 MED ORDER — SENNA 8.6 MG PO TABS
1.0000 | ORAL_TABLET | Freq: Two times a day (BID) | ORAL | Status: DC
  Administered 2013-06-12 – 2013-06-15 (×7): 8.6 mg via ORAL
  Filled 2013-06-12 (×8): qty 1

## 2013-06-12 NOTE — ED Notes (Signed)
Pt's HR 44 on monitor. NSR. RN shot EKG and gave to Dr. Clarene Duke. Pt informed.

## 2013-06-12 NOTE — ED Notes (Signed)
EKG given to Dr. McManus.  

## 2013-06-12 NOTE — ED Notes (Signed)
Per EMS, pt went to bed last night around 10pm, and woke from sleep with back pain between her scapula. EMS reports pt was pale, diaphoretic, nauseated, and reports emesis occurrence x1. Pt does not have cardiac hx. Pt denies SOB. EMS gave 6 mg of morphine, and pt's pain remained unchanged. 20 minutes into transport, pt reported pain was beginning to radiate to her right chest and right breast. EMS then gave 1 nitro and 324 asa and pain decreased to 8/10. EMS gave another nitro and pt's pain remained unchanged. BP 133/100. HR 68. RR 20. Pt is 97% on RA. A/o x4.

## 2013-06-12 NOTE — ED Notes (Signed)
Admitting team MD at bedside.

## 2013-06-12 NOTE — ED Notes (Signed)
MD at bedside. 

## 2013-06-12 NOTE — ED Provider Notes (Signed)
CSN: [id number]     Arrival date & time 06/12/13  0208 History   First MD Initiated Contact with Patient 06/12/13 0257     Chief Complaint  Patient presents with  . Back Pain  . Chest Pain    HPI Pt was seen at 0250. Per pt, c/o gradual onset and worsening of persistent back and chest "pain" that began PTA. Pt describes the pain as "sharp," located between her scapulas and radiating into her mid-chest. Has been associated with SOB, diaphoresis, nausea with vomiting x1. EMS gave SL ntg and morphine without change in symptoms. Denies hx of same pain, no palpitations, no cough, no calf/LE pain or unilateral swelling, no abd pain, no diarrhea, no fevers, no rash.    Past Medical History  Diagnosis Date  . GERD (gastroesophageal reflux disease)   . Esophageal stricture   . Hypertension   . Hyperlipidemia   . Edema   . Ruptured lumbar disc   . Arthritis   . Obesity   . Anemia     Iron def   Past Surgical History  Procedure Laterality Date  . Abdominal hysterectomy    . Lumbar laminectomy  1998  . Breast biopsy  1993    Left  . Tubal ligation  1979   Family History  Problem Relation Age of Onset  . Parkinsonism Mother 40  . Ovarian cancer Mother   . Prostate cancer Father 62  . Colon cancer Neg Hx    History  Substance Use Topics  . Smoking status: Never Smoker   . Smokeless tobacco: Never Used  . Alcohol Use: No    Review of Systems ROS: Statement: All systems negative except as marked or noted in the HPI; Constitutional: Negative for fever and chills. ; ; Eyes: Negative for eye pain, redness and discharge. ; ; ENMT: Negative for ear pain, hoarseness, nasal congestion, sinus pressure and sore throat. ; ; Cardiovascular: +CP, diaphoresis, SOB. Negative for palpitations, peripheral edema. ; ; Respiratory: Negative for cough, wheezing and stridor. ; ; Gastrointestinal: +nausea/vomiting. Negative for diarrhea, abdominal pain, blood in stool, hematemesis, jaundice and rectal  bleeding. . ; ; Genitourinary: Negative for dysuria, flank pain and hematuria. ; ; Musculoskeletal: +back pain. Negative for neck pain. Negative for swelling and trauma.; ; Skin: Negative for pruritus, rash, abrasions, blisters, bruising and skin lesion.; ; Neuro: Negative for headache, lightheadedness and neck stiffness. Negative for weakness, altered level of consciousness , altered mental status, extremity weakness, paresthesias, involuntary movement, seizure and syncope.       Allergies  Septra  Home Medications   Current Outpatient Rx  Name  Route  Sig  Dispense  Refill  . Cholecalciferol (VITAMIN D3) 2000 UNITS capsule   Oral   Take 2,000 Units by mouth daily.           Marland Kitchen lisinopril-hydrochlorothiazide (PRINZIDE,ZESTORETIC) 20-12.5 MG per tablet   Oral   Take 1 tablet by mouth daily.           . meloxicam (MOBIC) 15 MG tablet   Oral   Take 1 tablet (15 mg total) by mouth daily.   90 tablet   1   . omeprazole (PRILOSEC) 20 MG capsule   Oral   Take 20 mg by mouth daily.           . rosuvastatin (CRESTOR) 20 MG tablet   Oral   Take 20 mg by mouth daily.            BP  111/52  Pulse 48  Temp(Src) 98.2 F (36.8 C)  Resp 18  Ht 5' 3.5" (1.613 m)  Wt 226 lb (102.513 kg)  BMI 39.4 kg/m2  SpO2 100% Physical Exam 0255: Physical examination:  Nursing notes reviewed; Vital signs and O2 SAT reviewed;  Constitutional: Well developed, Well nourished, Well hydrated, Uncomfortable appearing.; Head:  Normocephalic, atraumatic; Eyes: EOMI, PERRL, No scleral icterus; ENMT: Mouth and pharynx normal, Mucous membranes moist; Neck: Supple, Full range of motion, No lymphadenopathy; Cardiovascular: Regular rate and rhythm, No gallop; Respiratory: Breath sounds clear & equal bilaterally, No rales, rhonchi, wheezes.  Speaking full sentences with ease, Normal respiratory effort/excursion; Chest: Nontender, Movement normal; Abdomen: Soft, Nontender, Nondistended, Normal bowel sounds;  Genitourinary: No CVA tenderness; Spine:  No midline CS, TS, LS tenderness.;; Extremities: Pulses normal, No tenderness, No edema, No calf edema or asymmetry.; Neuro: AA&Ox3, Major CN grossly intact.  Speech clear. No gross focal motor or sensory deficits in extremities.; Skin: Color pale, Warm, Diaphoretic.   ED Course  Procedures    MDM  MDM Reviewed: previous chart, nursing note and vitals Reviewed previous: ECG, labs, x-ray and CT scan Interpretation: ECG, labs, CT scan and x-ray      Date: 06/12/2013 on arrival  Rate: 57  Rhythm: normal sinus rhythm  QRS Axis: normal  Intervals: normal  ST/T Wave abnormalities: nonspecific T wave changes, TWI lead aVL  Conduction Disutrbances:none  Narrative Interpretation:   Old EKG Reviewed: none available   Date: 06/12/2013 repeat due to continued pain  Rate: 45  Rhythm: sinus bradycardia  QRS Axis: normal  Intervals: normal  ST/T Wave abnormalities: nonspecific T wave changes, TWI lead aVL  Conduction Disutrbances:none  Narrative Interpretation:   Old EKG Reviewed: unchanged from previous EKG today.   Results for orders placed during the hospital encounter of 06/12/13  CBC      Result Value Range   WBC 10.4  4.0 - 10.5 K/uL   RBC 4.19  3.87 - 5.11 MIL/uL   Hemoglobin 11.9 (*) 12.0 - 15.0 g/dL   HCT 54.0 (*) 98.1 - 19.1 %   MCV 84.7  78.0 - 100.0 fL   MCH 28.4  26.0 - 34.0 pg   MCHC 33.5  30.0 - 36.0 g/dL   RDW 47.8  29.5 - 62.1 %   Platelets 227  150 - 400 K/uL  COMPREHENSIVE METABOLIC PANEL      Result Value Range   Sodium 141  135 - 145 mEq/L   Potassium 4.4  3.5 - 5.1 mEq/L   Chloride 105  96 - 112 mEq/L   CO2 25  19 - 32 mEq/L   Glucose, Bld 157 (*) 70 - 99 mg/dL   BUN 23  6 - 23 mg/dL   Creatinine, Ser 3.08  0.50 - 1.10 mg/dL   Calcium 8.9  8.4 - 65.7 mg/dL   Total Protein 6.5  6.0 - 8.3 g/dL   Albumin 3.6  3.5 - 5.2 g/dL   AST 16  0 - 37 U/L   ALT 17  0 - 35 U/L   Alkaline Phosphatase 116  39 - 117 U/L    Total Bilirubin 0.3  0.3 - 1.2 mg/dL   GFR calc non Af Amer 53 (*) >90 mL/min   GFR calc Af Amer 61 (*) >90 mL/min  LIPASE, BLOOD      Result Value Range   Lipase 35  11 - 59 U/L  POCT I-STAT TROPONIN I      Result  Value Range   Troponin i, poc 0.00  0.00 - 0.08 ng/mL   Comment 3           POCT I-STAT TROPONIN I      Result Value Range   Troponin i, poc 0.00  0.00 - 0.08 ng/mL   Comment 3            Dg Chest Portable 1 View 06/12/2013   CLINICAL DATA:  Back and chest pain.  EXAM: PORTABLE CHEST - 1 VIEW  COMPARISON:  None.  FINDINGS: Mild cardiomegaly. Prominent perihilar and bibasilar interstitial markings without confluent airspace infiltrate. No effusion. Spurring in the mid and lower thoracic spine.  IMPRESSION: Cardiomegaly   Electronically Signed   By: Oley Balm M.D.   On: 06/12/2013 02:54   Ct Cta Abd/pel W/cm &/or W/o Cm 06/12/2013   CLINICAL DATA:  Back pain, diaphoresis, left shoulder pain.  EXAM: CT ANGIOGRAPHY CHEST, ABDOMEN AND PELVIS  TECHNIQUE: Multidetector CT imaging through the chest, abdomen and pelvis was performed using the standard protocol during bolus administration of intravenous contrast. Multiplanar reconstructed images including MIPs were obtained and reviewed to evaluate the vascular anatomy.  CONTRAST:  75mL OMNIPAQUE IOHEXOL 300 MG/ML  SOLN  COMPARISON:  07/22/2000 by report only  FINDINGS: CTA CHEST FINDINGS  The non contrast scan shows no mediastinal hematoma, pneumothorax, pleural or pericardial effusion. No hyperdense crescent.  CTA shows normal caliber of the thoracic aorta without significant atheromatous irregularity, dissection, aneurysm, or stenosis. Classic 3 vessel brachiocephalic arterial origin anatomy without proximal stenosis. Satisfactory opacification of pulmonary arteries noted, and there is no evidence of pulmonary emboli ; the exam was not optimized for detection of pulmonary emboli. No pleural or pericardial effusion. No hilar or mediastinal  adenopathy. Several subcentimeter low-attenuation left thyroid lesions are noted. Two small blebs in the right upper lobe. There is a nonspecific 6 mm subpleural nodule laterally in the right middle lobe image 49/6. Linear scarring or atelectasis in the inferolateral lingula. Spurring at multiple levels in the mid and lower thoracic spine.  Review of the MIP images confirms the above findings.  CTA ABDOMEN AND PELVIS FINDINGS  Arterial findings:  Aorta: Mildly tortuous. Normal in caliber and contour. No dissection, aneurysm, stenosis, or significant atheromatous irregularity.  Celiac axis:         Patent, unremarkable distal branching  Superior mesenteric: Patent.  Left renal:          Single, unremarkable.  Right renal:         Patent.  Inferior mesenteric: Patent.  Left iliac: Mildly tortuous, without aneurysm, dissection, stenosis, or significant atheromatous irregularity.  Right iliac:         Mildly tortuous.  Venous findings: Dedicated venous phase imaging was not obtained. Patent portal vein, splenic vein, and bilateral renal veins noted.  Review of the MIP images confirms the above findings.  Nonvascular findings: No retroperitoneal hemorrhage. Unremarkable arterial phase evaluation of liver, gallbladder, spleen, kidneys, adrenal glands, pancreas. Stomach, small bowel, and colon unremarkable. Urinary bladder physiologically distended. No ascites. Hysterectomy. No free air. No adenopathy localized. 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  IMPRESSION: CTA CHEST IMPRESSION  Negative for thoracic dissection or pulmonary embolus.  CTA ABDOMEN AND PELVIS IMPRESSION  *Negative for dissection, aneurysm, or other acute vascular abnormality.  : Review of the MIP images confirms the above findings.   Electronically Signed   By: Oley Balm M.D.   On: 06/12/2013 06:38     0730:  Continues  to c/o pain despite IV narcotic pain meds, GI cocktail, ASA and ntg gtt.  Troponin x2 negative, as well as EKG x2 unchanged.  CT scan without PE or TAA/AAA/dissection. Will continue to dose pain meds and admit. Dx and testing d/w pt and family.  Questions answered.  Verb understanding, agreeable to admit.  T/C to [facility] Resident, case discussed, including:  HPI, pertinent PM/SHx, VS/PE, dx testing, ED course and treatment:  Agreeable to admit, requests they will come to ED for eval.   Laray Anger, DO 06/14/13 1239

## 2013-06-12 NOTE — H&P (Signed)
**Note De-Identified Sarah Obfuscation** Date: 06/12/2013               Patient Name:  Sarah Benton MRN: [id number]  DOB: [birth year] Age / Sex: 67 y.o., female   PCP: Ernestina Penna, MD         Medical Service: Internal Medicine Teaching Service         Attending Physician: Dr. Burns Spain, MD    First Contact: Dr. Vivi Barrack Pager: [phone]  Second Contact: Dr. Suszanne Conners Pager: [phone]       After Hours (After 5p/  First Contact Pager: [phone]  weekends / holidays): Second Contact Pager: [phone]   Chief Complaint: Back and chest pain  History of Present Illness:  Sarah Benton is a 67 y.o. female PMH HTN, HLD, GERD, GE junction stricture s/p endoscopic dilation in 10/2010, low back pain s/p lumbar laminectomy in 1998, who presents with back pain radiating to the chest.  Her back pain is located in right scapula and radiates around her right flank into her right chest. She describes it as a stabbing pain, "like someone twisting a knife", and rates the severity as 10/10. Her symptoms awoke her from sleep this morning at 12am and have been constant. Positional changes, movement, and palpation do not relieve or exacerbate the pain. At home her pain was associated with diaphoresis, nausea, and 2 episodes of NBNB vomiting. She denies fevers, chills, rash, headache, left-sided chest pain, palpitations, leg swelling, orthopnea, abdominal pain. She denies any recent trauma or changes in her activity level. She has never had this pain before. Last month she did have left-sided, burning back, flank, and chest pain thought to be 2/2 shingles without rash vs. post-herpetic neuralgia. This resolved with Valtrex, gabapentin, and a methylprednisolone 80 injection x1. She was found to be varicella IgG positive and IgM negative at that time. Denies tobacco, alcohol, or drug use.  On the ride in EMS gave her SL NG, ASA, and Morphine 6mg . In the ED she also got Fentanyl IV x3, Dilaudid 1mg  IV x1, GI cocktail, Zofran. A  nitro gtt was initiated. She says after all this her pain is still present and severe at an 8/10. She was run through the [place] narcotic database and has no recent prescriptions for controlled substances.   Home Meds:  Current Outpatient Prescriptions  Medication Sig Dispense Refill  . Cholecalciferol (VITAMIN D3) 2000 UNITS capsule Take 2,000 Units by mouth daily.        Marland Kitchen lisinopril-hydrochlorothiazide (PRINZIDE,ZESTORETIC) 20-12.5 MG per tablet Take 1 tablet by mouth daily.        . meloxicam (MOBIC) 15 MG tablet Take 1 tablet (15 mg total) by mouth daily.  90 tablet  1  . omeprazole (PRILOSEC) 20 MG capsule Take 20 mg by mouth daily.        . rosuvastatin (CRESTOR) 20 MG tablet Take 20 mg by mouth daily.          Allergies: Allergies as of 06/12/2013 - Review Complete 06/12/2013  Allergen Reaction Noted  . Septra [sulfamethoxazole-tmp ds]  04/06/2012   Past Medical History  Diagnosis Date  . GERD (gastroesophageal reflux disease)   . Esophageal stricture   . Hypertension   . Hyperlipidemia   . Edema   . Ruptured lumbar disc   . Arthritis   . Obesity   . Anemia     Iron def   Past Surgical History  Procedure Laterality Date  . Abdominal hysterectomy    .  Lumbar laminectomy  1998  . Breast biopsy  1993    Left  . Tubal ligation  1979   Family History  Problem Relation Age of Onset  . Parkinsonism Mother 60  . Ovarian cancer Mother   . Prostate cancer Father 63  . Colon cancer Neg Hx    History   Social History  . Marital Status: Married    Spouse Name: N/A    Number of Children: 2  . Years of Education: N/A   Occupational History  . Retired     Public affairs consultant   Social History Main Topics  . Smoking status: Never Smoker   . Smokeless tobacco: Never Used  . Alcohol Use: No  . Drug Use: No  . Sexual Activity: Not on file   Other Topics Concern  . Not on file   Social History Narrative  . No narrative on file    Review of Systems: Pertinent items  are noted in HPI.  Physical Exam: Blood pressure 93/52, pulse 55, temperature 98.2 F (36.8 C), resp. rate 16, height 5' 3.5" (1.613 m), weight 226 lb (102.513 kg), SpO2 99.00%. Physical Exam  Constitutional: She is oriented to person, place, and time and well-developed, well-nourished, and in no distress.  HENT:  Head: Normocephalic and atraumatic.  Eyes: Conjunctivae and EOM are normal. Pupils are equal, round, and reactive to light.  Neck: Normal range of motion. Neck supple.  Cardiovascular: Normal rate, regular rhythm and intact distal pulses.  Exam reveals no gallop and no friction rub.   No murmur heard. Pulmonary/Chest: Effort normal and breath sounds normal. No respiratory distress. She has no wheezes. She has no rales. She exhibits no tenderness.  Abdominal: Soft. Bowel sounds are normal. She exhibits no distension and no mass. There is no tenderness. There is no rebound and no guarding.  Musculoskeletal: Normal range of motion. She exhibits no edema and no tenderness.  Neurological: She is alert and oriented to person, place, and time. No cranial nerve deficit. GCS score is 15.  Skin: Skin is warm and dry. No rash noted. She is not diaphoretic.     Lab results: Basic Metabolic Panel:  Recent Labs  16/10/96 0247  NA 141  K 4.4  CL 105  CO2 25  GLUCOSE 157*  BUN 23  CREATININE 1.05  CALCIUM 8.9   Liver Function Tests:  Recent Labs  06/12/13 0247  AST 16  ALT 17  ALKPHOS 116  BILITOT 0.3  PROT 6.5  ALBUMIN 3.6    Recent Labs  06/12/13 0247  LIPASE 35   CBC:  Recent Labs  06/12/13 0247  WBC 10.4  HGB 11.9*  HCT 35.5*  MCV 84.7  PLT 227   Troponin i, poc  Date Value Range Status  06/12/2013 0.00  0.00 - 0.08 ng/mL Final  06/12/2013 0.00  0.00 - 0.08 ng/mL Final      Imaging results:  Dg Chest Portable 1 View  06/12/2013   CLINICAL DATA:  Back and chest pain.  EXAM: PORTABLE CHEST - 1 VIEW  COMPARISON:  None.  FINDINGS: Mild cardiomegaly.  Prominent perihilar and bibasilar interstitial markings without confluent airspace infiltrate. No effusion. Spurring in the mid and lower thoracic spine.  IMPRESSION: Cardiomegaly   Electronically Signed   By: Oley Balm M.D.   On: 06/12/2013 02:54   Ct Cta Abd/pel W/cm &/or W/o Cm  06/12/2013   CLINICAL DATA:  Back pain, diaphoresis, left shoulder pain.  EXAM: CT ANGIOGRAPHY CHEST,  ABDOMEN AND PELVIS  TECHNIQUE: Multidetector CT imaging through the chest, abdomen and pelvis was performed using the standard protocol during bolus administration of intravenous contrast. Multiplanar reconstructed images including MIPs were obtained and reviewed to evaluate the vascular anatomy.  CONTRAST:  75mL OMNIPAQUE IOHEXOL 300 MG/ML  SOLN  COMPARISON:  07/22/2000 by report only  FINDINGS: CTA CHEST FINDINGS  The non contrast scan shows no mediastinal hematoma, pneumothorax, pleural or pericardial effusion. No hyperdense crescent.  CTA shows normal caliber of the thoracic aorta without significant atheromatous irregularity, dissection, aneurysm, or stenosis. Classic 3 vessel brachiocephalic arterial origin anatomy without proximal stenosis. Satisfactory opacification of pulmonary arteries noted, and there is no evidence of pulmonary emboli ; the exam was not optimized for detection of pulmonary emboli. No pleural or pericardial effusion. No hilar or mediastinal adenopathy. Several subcentimeter low-attenuation left thyroid lesions are noted. Two small blebs in the right upper lobe. There is a nonspecific 6 mm subpleural nodule laterally in the right middle lobe image 49/6. Linear scarring or atelectasis in the inferolateral lingula. Spurring at multiple levels in the mid and lower thoracic spine.  Review of the MIP images confirms the above findings.  CTA ABDOMEN AND PELVIS FINDINGS  Arterial findings:  Aorta: Mildly tortuous. Normal in caliber and contour. No dissection, aneurysm, stenosis, or significant atheromatous  irregularity.  Celiac axis:         Patent, unremarkable distal branching  Superior mesenteric: Patent.  Left renal:          Single, unremarkable.  Right renal:         Patent.  Inferior mesenteric: Patent.  Left iliac: Mildly tortuous, without aneurysm, dissection, stenosis, or significant atheromatous irregularity.  Right iliac:         Mildly tortuous.  Venous findings: Dedicated venous phase imaging was not obtained. Patent portal vein, splenic vein, and bilateral renal veins noted.  Review of the MIP images confirms the above findings.  Nonvascular findings: No retroperitoneal hemorrhage. Unremarkable arterial phase evaluation of liver, gallbladder, spleen, kidneys, adrenal glands, pancreas. Stomach, small bowel, and colon unremarkable. Urinary bladder physiologically distended. No ascites. Hysterectomy. No free air. No adenopathy localized. 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  IMPRESSION: CTA CHEST IMPRESSION  Negative for thoracic dissection or pulmonary embolus.  CTA ABDOMEN AND PELVIS IMPRESSION  *Negative for dissection, aneurysm, or other acute vascular abnormality.  : Review of the MIP images confirms the above findings.   Electronically Signed   By: Oley Balm M.D.   On: 06/12/2013 06:38    Other results: EKG: nonspecific ST and T waves changes, sinus bradycardia, no prior EKG.  Assessment & Plan by Problem: AURORAH Benton is a 67 y.o. female PMH HTN, HLD, GERD, GE junction stricture s/p endoscopic dilation in 10/2010, low back pain s/p lumbar laminectomy in 1998, who presents with back pain radiating to the chest.  #Acute thoracic back pain radiating to the chest - Story was initially concerning for acute thoracic vascular abnormality. However we have ruled out aortic dissection and aortic aneurysm on imaging, as well as PE, pneumothorax, pneumonia, pleural effusion, rib fracture, cholecystitis, pancreatitis, retroperitoneal hemorrhage. Her EKG showed sinus bradycardia s/p  Fentanyl and some non-specific TWIs. POC troponins have been negative x2. She has no known cardiac history. Her imaging did reveal some nonspecific bony spurring in the mid and lower thoracic spine, which could suggest nerve impingement. Subjectively she says her discomfort is resistant to strong pain medication, however objectively she was in no acute  distress during our interview. She is Varicella IgG positive and the description of her pain in a dermatomal distribution is suggestive of possible early shingles (no rash at present) vs. post-herpetic neuralgia. - Admit to IMTS, telemetry, observation - Continue to trend troponins - Repeat Varicella IgM ab testing - Starting acyclovir 800mg  5x/day for possible early shingles - Starting amitriptyline 25mg  qhs for possible post-herpetic neuralgia - Regular diet - Zofran prn nausea - Morphine 4mg  q4h prn pain  #HTN - Stable. - Holding home HCTZ/lisinpril given recent multiple vaso-depressing medications  #HLD - Continue statin.  #GERD - Protonix 40mg  daily.  #DVT PPX - Lovenox subq  Dispo: Disposition is deferred at this time, awaiting improvement of current medical problems. Anticipated discharge in approximately 1-3 day(s).   The patient does have a current PCP Ernestina Penna, MD) and does need an [facility] hospital follow-up appointment after discharge.  The patient does not have transportation limitations that hinder transportation to clinic appointments.  Signed: Vivi Barrack, MD 06/12/2013, 9:25 AM

## 2013-06-13 LAB — CBC
HCT: 35.8 % — ABNORMAL LOW (ref 36.0–46.0)
Hemoglobin: 11.7 g/dL — ABNORMAL LOW (ref 12.0–15.0)
RBC: 4.19 MIL/uL (ref 3.87–5.11)

## 2013-06-13 LAB — HIV ANTIBODY (ROUTINE TESTING W REFLEX): HIV: NONREACTIVE

## 2013-06-13 MED ORDER — VALACYCLOVIR HCL 500 MG PO TABS
1000.0000 mg | ORAL_TABLET | Freq: Three times a day (TID) | ORAL | Status: DC
  Administered 2013-06-13 – 2013-06-15 (×7): 1000 mg via ORAL
  Filled 2013-06-13 (×9): qty 2

## 2013-06-13 MED ORDER — ACETAMINOPHEN 325 MG PO TABS
650.0000 mg | ORAL_TABLET | Freq: Four times a day (QID) | ORAL | Status: DC | PRN
  Administered 2013-06-13 – 2013-06-14 (×2): 650 mg via ORAL
  Filled 2013-06-13 (×2): qty 2

## 2013-06-13 MED ORDER — HYDROCODONE-ACETAMINOPHEN 5-325 MG PO TABS
1.0000 | ORAL_TABLET | ORAL | Status: DC | PRN
  Administered 2013-06-13 – 2013-06-14 (×3): 1 via ORAL
  Filled 2013-06-13 (×3): qty 1

## 2013-06-13 NOTE — Progress Notes (Signed)
Utilization Review completed.  

## 2013-06-13 NOTE — Progress Notes (Signed)
Notified Dr. Aundria Rud about Temp 101.1. No complaints of pain. New orders received for Tylenol and UA. Will continue to monitor. Earnest Conroy RN

## 2013-06-13 NOTE — Progress Notes (Addendum)
Subjective: Patient seen at bedside. She says her pain improved overnight, but came back somewhat this morning. However it is not as bad as yesterday. She got 4mg  morphine before we saw her, which did cause a little bit of nausea. Overnight she developed a fever to 101, which resolved without medication. The night team saw her at the time; she felt well and denied pain, headache, shortness of breath, chest pain, nausea, vomiting. Blood cultures were drawn. This morning her WBC trended up from 10.4>19.0. She denies headache, neck pain, cough, abdominal pain, vomiting, diarrhea, dysuria, frequency, rash.  Objective: Vital signs in last 24 hours: Filed Vitals:   06/12/13 1438 06/12/13 2100 06/12/13 2337 06/13/13 0505  BP: 131/45 114/40  127/49  Pulse: 106 102  93  Temp: 100.5 F (38.1 C) 101 F (38.3 C) 99.7 F (37.6 C) 99.9 F (37.7 C)  TempSrc: Oral  Oral Oral  Resp: 20 18  16   Height:      Weight:      SpO2: 94% 92%  94%   Weight change:   Intake/Output Summary (Last 24 hours) at 06/13/13 0947 Last data filed at 06/13/13 1610  Gross per 24 hour  Intake    489 ml  Output    450 ml  Net     39 ml   Physical Exam  Constitutional: She is oriented to person, place, and time and well-developed, well-nourished, and in no distress.  HENT:  Head: Normocephalic and atraumatic.  Eyes: Conjunctivae and EOM are normal. Pupils are equal, round, and reactive to light.  Neck: Normal range of motion. Neck supple.  Cardiovascular: Normal rate, regular rhythm and intact distal pulses. Exam reveals no gallop and no friction rub.  No murmur heard.  Pulmonary/Chest: Effort normal and breath sounds normal. No respiratory distress. She has no wheezes. She has no rales. She exhibits no tenderness.  Abdominal: Soft. Bowel sounds are normal. She exhibits no distension and no mass. There is no tenderness. There is no rebound and no guarding.  Musculoskeletal: Normal range of motion. She exhibits no  edema and no tenderness to palpation in right back, flank, or chest. Neurological: She is alert and oriented to person, place, and time. No cranial nerve deficit. GCS score is 15.  Skin: Skin is warm and dry. No rash noted. She is not diaphoretic.   Lab Results: Basic Metabolic Panel:  Recent Labs Lab 06/12/13 0247  NA 141  K 4.4  CL 105  CO2 25  GLUCOSE 157*  BUN 23  CREATININE 1.05  CALCIUM 8.9   Liver Function Tests:  Recent Labs Lab 06/12/13 0247  AST 16  ALT 17  ALKPHOS 116  BILITOT 0.3  PROT 6.5  ALBUMIN 3.6    Recent Labs Lab 06/12/13 0247  LIPASE 35   CBC:  Recent Labs Lab 06/12/13 0247 06/13/13 0620  WBC 10.4 19.0*  HGB 11.9* 11.7*  HCT 35.5* 35.8*  MCV 84.7 85.4  PLT 227 200   Cardiac Enzymes:  Recent Labs Lab 06/12/13 1058 06/12/13 1530  TROPONINI <0.30 <0.30    Micro Results: No results found for this or any previous visit (from the past 240 hour(s)). Studies/Results: Dg Chest Portable 1 View  06/12/2013   CLINICAL DATA:  Back and chest pain.  EXAM: PORTABLE CHEST - 1 VIEW  COMPARISON:  None.  FINDINGS: Mild cardiomegaly. Prominent perihilar and bibasilar interstitial markings without confluent airspace infiltrate. No effusion. Spurring in the mid and lower thoracic spine.  IMPRESSION:  Cardiomegaly   Electronically Signed   By: Oley Balm M.D.   On: 06/12/2013 02:54   Ct Cta Abd/pel W/cm &/or W/o Cm  06/12/2013   CLINICAL DATA:  Back pain, diaphoresis, left shoulder pain.  EXAM: CT ANGIOGRAPHY CHEST, ABDOMEN AND PELVIS  TECHNIQUE: Multidetector CT imaging through the chest, abdomen and pelvis was performed using the standard protocol during bolus administration of intravenous contrast. Multiplanar reconstructed images including MIPs were obtained and reviewed to evaluate the vascular anatomy.  CONTRAST:  75mL OMNIPAQUE IOHEXOL 300 MG/ML  SOLN  COMPARISON:  07/22/2000 by report only  FINDINGS: CTA CHEST FINDINGS  The non contrast scan  shows no mediastinal hematoma, pneumothorax, pleural or pericardial effusion. No hyperdense crescent.  CTA shows normal caliber of the thoracic aorta without significant atheromatous irregularity, dissection, aneurysm, or stenosis. Classic 3 vessel brachiocephalic arterial origin anatomy without proximal stenosis. Satisfactory opacification of pulmonary arteries noted, and there is no evidence of pulmonary emboli ; the exam was not optimized for detection of pulmonary emboli. No pleural or pericardial effusion. No hilar or mediastinal adenopathy. Several subcentimeter low-attenuation left thyroid lesions are noted. Two small blebs in the right upper lobe. There is a nonspecific 6 mm subpleural nodule laterally in the right middle lobe image 49/6. Linear scarring or atelectasis in the inferolateral lingula. Spurring at multiple levels in the mid and lower thoracic spine.  Review of the MIP images confirms the above findings.  CTA ABDOMEN AND PELVIS FINDINGS  Arterial findings:  Aorta: Mildly tortuous. Normal in caliber and contour. No dissection, aneurysm, stenosis, or significant atheromatous irregularity.  Celiac axis:         Patent, unremarkable distal branching  Superior mesenteric: Patent.  Left renal:          Single, unremarkable.  Right renal:         Patent.  Inferior mesenteric: Patent.  Left iliac: Mildly tortuous, without aneurysm, dissection, stenosis, or significant atheromatous irregularity.  Right iliac:         Mildly tortuous.  Venous findings: Dedicated venous phase imaging was not obtained. Patent portal vein, splenic vein, and bilateral renal veins noted.  Review of the MIP images confirms the above findings.  Nonvascular findings: No retroperitoneal hemorrhage. Unremarkable arterial phase evaluation of liver, gallbladder, spleen, kidneys, adrenal glands, pancreas. Stomach, small bowel, and colon unremarkable. Urinary bladder physiologically distended. No ascites. Hysterectomy. No free air. No  adenopathy localized. 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  IMPRESSION: CTA CHEST IMPRESSION  Negative for thoracic dissection or pulmonary embolus.  CTA ABDOMEN AND PELVIS IMPRESSION  *Negative for dissection, aneurysm, or other acute vascular abnormality.  : Review of the MIP images confirms the above findings.   Electronically Signed   By: Oley Balm M.D.   On: 06/12/2013 06:38   Medications: I have reviewed the patient's current medications. Scheduled Meds: . amitriptyline  25 mg Oral QHS  . atorvastatin  40 mg Oral q1800  . enoxaparin (LOVENOX) injection  40 mg Subcutaneous Daily  . pantoprazole  40 mg Oral Daily  . senna  1 tablet Oral BID  . sodium chloride  3 mL Intravenous Q12H  . valACYclovir  1,000 mg Oral TID   Continuous Infusions:  PRN Meds:.morphine injection, ondansetron (ZOFRAN) IV, ondansetron  Assessment/Plan: PANDORA MCCRACKIN is a 67 y.o. female PMH HTN, HLD, GERD, GE junction stricture s/p endoscopic dilation in 10/2010, low back pain s/p lumbar laminectomy in 1998, who presents with back pain radiating to the chest.  #  Acute thoracic back pain radiating to the chest - Story was initially concerning for aortic dissection vs. aneurysm. However we have ruled out acute cardiac, thoracic, and abdominal etiologies. MSK etiology is unlikely as there is no pain to direct palpation, and she has normal range of motion. She has a history of dermatomal burning pain on the left last month, resolved with treatment for VZV. Given this and the description of her pain in a dermatomal distribution, her pain may represent early or rash-less shingles vs. post-herpetic neuralgia. Her pain did seem to improve overnight on anti-virals and amitriptyline. However is very rare to get rash-less VZV, let alone twice in one month on different sides of the body. - Discontinue cardiac monitoring - Follow up repeat Varicella IgM ab testing  - Checking HIV as immunosuppression could increase  likelihood of getting VZV twice in a short time period - Changing Acyclovir to Valcyclovir 1000mg  3x/day for possible early shingles (less frequent dosing) - Continue Amitriptyline 25mg  qhs for possible post-herpetic neuralgia  - Regular diet  - Zofran prn nausea  - Changing pain regimen to Norco 5 po q4h prn  #Leukocytosis and transient fever - Unclear etiology. Shingles can cause a transient fever, but it would be unusual to see a large jump in the WBC as well. Patient denies pneumonia, UTI, meningitis, gastroenteritis, abscess symptoms. - Follow up blood cultures - Will discuss with CT radiology to see if MRI is indicated to check for soft tissue or epidural abscess (or nerve impingement as cause of pain) - Curbsided the neuroradiologist on call, who looked at her CT again. No abnormality of the thoracic spine that could cause nerve impingement. No epidural abscess. No chest wall, muscular, soft tissue abscess. - Continue to monitor  #HTN - Stable. Currently 127/49. - Holding home HCTZ/lisinpril given recent multiple vaso-depressing medications   #HLD - Continue statin.  #GERD - Protonix 40mg  daily.  #DVT PPX - Lovenox subq.   Dispo: Disposition is deferred at this time, awaiting improvement of current medical problems.  Anticipated discharge in approximately 1-3 day(s).   The patient does have a current PCP Ernestina Penna, MD) and does need an [facility] hospital follow-up appointment after discharge.  The patient does not have transportation limitations that hinder transportation to clinic appointments.  .Services Needed at time of discharge: Y = Yes, Blank = No PT:   OT:   RN:   Equipment:   Other:     LOS: 1 day   Vivi Barrack, MD 06/13/2013, 9:47 AM

## 2013-06-13 NOTE — H&P (Signed)
  Date: 06/13/2013  Patient name: Sarah Benton  Medical record number: [id number]  Date of birth: 1946/04/05   I have seen and evaluated Sarah Benton and discussed their care with the Residency Team. Sarah Benton was admitted with acute onset of pain in R scapula that radiates to the right chest in a dermatomal distribution. The pain is severe, One month ago, she had a dermatomal burning pain but on the L and was treated for rashless Zoster with resolution.  She has had a CT chest, abd, and pelvis that did not show a reason for her R pain.   Assessment and Plan: I have seen and evaluated the patient as outlined above. I agree with the formulated Assessment and Plan as detailed in the residents' admission note, with the following changes:   1. R dermatomal pain - It is unusual to get rash free Zoster (but maybe just early in d course) and unusual to get it twice in contralateral sides in one month. Will check for HIV as immunosuppression would make this more likely. Agree with antivirals and tx of pain. Other possibilities are nerve impingement  - will discuss with rads whether CT was able to see spine well enough to R/O this dx. Sarah would be very unlikely - not the typical age and no other Sarah sxs present.   2. Acute febrile illness - no urine, pul, skin, ABD, head sxs that would point to a cause. But with pain, abscess might be a cause of both so will discuss CT with rads and see if an MRI is indicated to look for soft tissue abscess or epidural abscess.   Sarah Spain, MD 9/21/201410:22 AM

## 2013-06-13 NOTE — Progress Notes (Signed)
Overnight X-Cover Note S: Patient had Fever of 101 overnight, which resolved without medication.   Patient reports that she was feeling well. She denied any current pain, HA, SOB, Chest pressure, nausea or vomiting. O:   GEN; NAD Pulm: CTA b/l Cardio: RRR no murmurs Abd: Soft, BS normoactive, non-tender, no rebound, no guarding, nondistended. Skin: No rash or blisters/vesicles noted on back or abdomen. A: Fever- etiology unknown at this point, patient is non toxic appearing and clinically stable. P: Ordered Blood Cultures x2. F/U AM CBC, Defer management to primary team.  Carlynn Purl DO

## 2013-06-14 ENCOUNTER — Observation Stay (HOSPITAL_COMMUNITY): Payer: Medicare Other

## 2013-06-14 DIAGNOSIS — M546 Pain in thoracic spine: Secondary | ICD-10-CM

## 2013-06-14 DIAGNOSIS — I10 Essential (primary) hypertension: Secondary | ICD-10-CM

## 2013-06-14 DIAGNOSIS — J189 Pneumonia, unspecified organism: Secondary | ICD-10-CM

## 2013-06-14 LAB — URINALYSIS, ROUTINE W REFLEX MICROSCOPIC
Bilirubin Urine: NEGATIVE
Glucose, UA: NEGATIVE mg/dL
Ketones, ur: NEGATIVE mg/dL
Protein, ur: 30 mg/dL — AB
Specific Gravity, Urine: 1.009 (ref 1.005–1.030)
Urobilinogen, UA: 1 mg/dL (ref 0.0–1.0)

## 2013-06-14 LAB — CBC WITH DIFFERENTIAL/PLATELET
Basophils Relative: 0 % (ref 0–1)
Eosinophils Absolute: 0 10*3/uL (ref 0.0–0.7)
Eosinophils Relative: 0 % (ref 0–5)
HCT: 34.1 % — ABNORMAL LOW (ref 36.0–46.0)
Hemoglobin: 11.2 g/dL — ABNORMAL LOW (ref 12.0–15.0)
Lymphs Abs: 0.8 10*3/uL (ref 0.7–4.0)
MCH: 27.9 pg (ref 26.0–34.0)
MCHC: 32.8 g/dL (ref 30.0–36.0)
Monocytes Absolute: 1.5 10*3/uL — ABNORMAL HIGH (ref 0.1–1.0)
Monocytes Relative: 8 % (ref 3–12)
Neutro Abs: 16.2 10*3/uL — ABNORMAL HIGH (ref 1.7–7.7)
RDW: 13.7 % (ref 11.5–15.5)

## 2013-06-14 LAB — URINE MICROSCOPIC-ADD ON

## 2013-06-14 LAB — STREP PNEUMONIAE URINARY ANTIGEN: Strep Pneumo Urinary Antigen: NEGATIVE

## 2013-06-14 MED ORDER — LEVOFLOXACIN 750 MG PO TABS
750.0000 mg | ORAL_TABLET | ORAL | Status: DC
  Administered 2013-06-14 – 2013-06-15 (×2): 750 mg via ORAL
  Filled 2013-06-14 (×2): qty 1

## 2013-06-14 NOTE — Progress Notes (Signed)
  Date: 06/14/2013  Patient name: Sarah Benton  Medical record number: [id number]  Date of birth: 05-Sep-1946   This patient has been seen and the plan of care was discussed with the house staff. Please see their note for complete details. I concur with their findings.  Inez Catalina, MD 06/14/2013, 1:02 PM

## 2013-06-14 NOTE — Progress Notes (Signed)
Subjective: Patient seen at bedside. She endorses some muscle aches and fatigue this morning. She still denies headache, neck pain, cough, abdominal pain, vomiting, diarrhea, dysuria, frequency, rash. She says her right back/flank/chest pain has significantly improved. Now it only feels like a soreness. Overnight she again developed a fever, to 103. Tylenol was given and a UA/culture was drawn. She is currently afebrile.  We spoke extensively about her travel history. She was in [place] from Aug. 18-23 with a family member. [place] and [place]. They did not do any intense outdoor pursuits such as hiking or camping, but they did spent time outside on beaches. She does not remember getting any bug/tick bites. No history of a target shaped rash. She passed through the [place] and [place] airports on the trip. She denies any international travel. She has spent her whole life in [place], [place]. She has never been to the [employer]. She has no had her flu shot yet this year.  Objective: Vital signs in last 24 hours: Filed Vitals:   06/13/13 2030 06/13/13 2120 06/14/13 0500 06/14/13 0600  BP: 127/51  131/42   Pulse: 112  95   Temp: 103 F (39.4 C) 100.2 F (37.9 C) 100.1 F (37.8 C) 99 F (37.2 C)  TempSrc: Oral     Resp: 17  16   Height:      Weight:      SpO2: 92%  93%    Weight change:   Intake/Output Summary (Last 24 hours) at 06/14/13 4540 Last data filed at 06/13/13 2141  Gross per 24 hour  Intake      6 ml  Output    700 ml  Net   -694 ml   Physical Exam  Constitutional: She is oriented to person, place, and time and well-developed, well-nourished, and in no distress.  HENT:  Head: Normocephalic and atraumatic.  Eyes: Conjunctivae and EOM are normal. Pupils are equal, round, and reactive to light.  Neck: Normal range of motion. Neck supple.  Cardiovascular: Normal rate, regular rhythm and intact distal pulses. Exam reveals no gallop and no friction rub.  No  murmur heard.  Pulmonary/Chest: Effort normal. No respiratory distress. She has no wheezes. There are crackles in her RML/RLL. She exhibits no tenderness.  Abdominal: Soft. Bowel sounds are normal. She exhibits no distension and no mass. There is no tenderness. There is no rebound and no guarding.  Musculoskeletal: Normal range of motion. She exhibits no edema and no tenderness to palpation in right back, flank, or chest. Neurological: She is alert and oriented to person, place, and time. No cranial nerve deficit. GCS score is 15.  Skin: Skin is warm and dry. No rash noted. She is not diaphoretic.   Lab Results: Basic Metabolic Panel:  Recent Labs Lab 06/12/13 0247  NA 141  K 4.4  CL 105  CO2 25  GLUCOSE 157*  BUN 23  CREATININE 1.05  CALCIUM 8.9   Liver Function Tests:  Recent Labs Lab 06/12/13 0247  AST 16  ALT 17  ALKPHOS 116  BILITOT 0.3  PROT 6.5  ALBUMIN 3.6    Recent Labs Lab 06/12/13 0247  LIPASE 35   CBC:  Recent Labs Lab 06/13/13 0620 06/14/13 0426  WBC 19.0* 18.5*  NEUTROABS  --  16.2*  HGB 11.7* 11.2*  HCT 35.8* 34.1*  MCV 85.4 85.0  PLT 200 196   Cardiac Enzymes:  Recent Labs Lab 06/12/13 1058 06/12/13 1530  TROPONINI <0.30 <  0.30    Micro Results: No results found for this or any previous visit (from the past 240 hour(s)). Studies/Results: No results found. Medications: I have reviewed the patient's current medications. Scheduled Meds: . amitriptyline  25 mg Oral QHS  . atorvastatin  40 mg Oral q1800  . enoxaparin (LOVENOX) injection  40 mg Subcutaneous Daily  . pantoprazole  40 mg Oral Daily  . senna  1 tablet Oral BID  . sodium chloride  3 mL Intravenous Q12H  . valACYclovir  1,000 mg Oral TID   Continuous Infusions:  PRN Meds:.acetaminophen, HYDROcodone-acetaminophen, ondansetron  Assessment/Plan: Sarah Benton is a 67 y.o. female PMH HTN, HLD, GERD, GE junction stricture s/p endoscopic dilation in 10/2010, low back  pain s/p lumbar laminectomy in 1998, who presents with back pain radiating to the chest.  #Leukocytosis and transient fever - She has some muscle aches and fatigue this morning. She denies SOB but had some decreased O2 sats overnight (88-93%).  I did hear crackles in her right lung base this morning. Chest x-ray this morning showed mild patchy opacity right lung base suspicious for early pneumonia. She is at slightly increased risk for tick-born disease due to recent trip to [place], but denies significant outdoors pursuits. Will wait to pursue this work up unless treatment for pneumonia fails. - Treating for CAP with po Levaquin - Follow up respiratory virus panel - Follow up legionella, step pneumo antigen - Follow up blood, urine cultures (NGTD) - Continue to monitor  #Acute thoracic back pain radiating to the chest - Improved to a mild soreness this morning after 2 days of therapy with anti-virals and amitriptyline. She is HIV negative. - Follow up repeat Varicella IgM ab testing (in process) - Continue Valcyclovir 1000mg  3x/day for possible early shingles - Continue Amitriptyline 25mg  qhs for possible post-herpetic neuralgia  - Regular diet  - Zofran prn nausea  - Norco 5 po q4h prn pain  #HTN - Stable. Currently 131/42. - Holding home HCTZ/lisinpril given recent multiple vaso-depressing medications   #HLD - Continue statin.  #GERD - Protonix 40mg  daily.  #DVT PPX - Lovenox subq.   Dispo: Disposition is deferred at this time, awaiting improvement of current medical problems.  Anticipated discharge in approximately 1-3 day(s).   The patient does have a current PCP Ernestina Penna, MD) and does need an [facility] hospital follow-up appointment after discharge.  The patient does not have transportation limitations that hinder transportation to clinic appointments.  .Services Needed at time of discharge: Y = Yes, Blank = No PT:   OT:   RN:   Equipment:   Other:     LOS: 2 days    Vivi Barrack, MD 06/14/2013, 8:11 AM

## 2013-06-14 NOTE — Progress Notes (Signed)
Utilization review completed.  

## 2013-06-15 LAB — LEGIONELLA ANTIGEN, URINE

## 2013-06-15 LAB — CBC
MCHC: 33.2 g/dL (ref 30.0–36.0)
MCV: 84.3 fL (ref 78.0–100.0)
Platelets: 197 10*3/uL (ref 150–400)
RBC: 3.89 MIL/uL (ref 3.87–5.11)
WBC: 16.5 10*3/uL — ABNORMAL HIGH (ref 4.0–10.5)

## 2013-06-15 LAB — URINE CULTURE
Colony Count: NO GROWTH
Culture: NO GROWTH

## 2013-06-15 MED ORDER — HYDROCODONE-ACETAMINOPHEN 5-325 MG PO TABS: 1.0000 | ORAL_TABLET | Freq: Four times a day (QID) | ORAL | Status: DC | PRN

## 2013-06-15 MED ORDER — LEVOFLOXACIN 750 MG PO TABS: 750.0000 mg | ORAL_TABLET | ORAL | Status: AC

## 2013-06-15 MED ORDER — AMITRIPTYLINE HCL 25 MG PO TABS: 25.0000 mg | ORAL_TABLET | Freq: Every day | ORAL | Status: DC

## 2013-06-15 MED ORDER — VALACYCLOVIR HCL 1 G PO TABS: 1000.0000 mg | ORAL_TABLET | Freq: Three times a day (TID) | ORAL | Status: AC

## 2013-06-15 NOTE — Discharge Summary (Signed)
Name: Sarah Benton MRN: [id number] DOB: [birth year] 67 y.o. PCP: Ernestina Penna, MD  Date of Admission: 06/12/2013  2:08 AM Date of Discharge: 06/15/2013 Attending Physician: Inez Catalina, MD  Discharge Diagnosis:  1. Acute thoracic back pain radiating to the chest  2. Community acquired pneumonia 3. Hypertension  Discharge Medications:   Medication List         amitriptyline 25 MG tablet  Commonly known as:  ELAVIL  Take 1 tablet (25 mg total) by mouth at bedtime.     HYDROcodone-acetaminophen 5-325 MG per tablet  Commonly known as:  NORCO/VICODIN  Take 1 tablet by mouth every 6 (six) hours as needed.     levofloxacin 750 MG tablet  Commonly known as:  LEVAQUIN  Take 1 tablet (750 mg total) by mouth daily.     lisinopril-hydrochlorothiazide 20-12.5 MG per tablet  Commonly known as:  PRINZIDE,ZESTORETIC  Take 1 tablet by mouth daily.     meloxicam 15 MG tablet  Commonly known as:  MOBIC  Take 1 tablet (15 mg total) by mouth daily.     omeprazole 20 MG capsule  Commonly known as:  PRILOSEC  Take 20 mg by mouth daily.     rosuvastatin 20 MG tablet  Commonly known as:  CRESTOR  Take 20 mg by mouth daily.     valACYclovir 1000 MG tablet  Commonly known as:  VALTREX  Take 1 tablet (1,000 mg total) by mouth 3 (three) times daily.     Vitamin D3 2000 UNITS capsule  Take 2,000 Units by mouth daily.        Disposition and follow-up:   Sarah Benton was discharged from [facility] in Stable condition.  At the hospital follow up visit please address:  1.  Thoracic back pain - has it improved? Any recurrent fevers? Perhaps consider tick-borne illness workup if she is still having strange symptoms.  2.  Labs / imaging needed at time of follow-up: CBC (to see if leukocytosis has resolved)  3.  Pending labs/ test needing follow-up: None  Follow-up Appointments:     Follow-up Information   Follow up with Bennie Pierini, FNP On  06/21/2013. (@10 :30am. This is your primary care doctor.)    Specialty:  Nurse Practitioner   Contact information:   [address] [place] [address] [phone]       Discharge Instructions: Discharge Orders   Future Appointments Provider Department Dept Phone   06/21/2013 10:45 AM Mary-Margaret Daphine Deutscher, FNP [facility] [phone]   Future Orders Complete By Expires   Diet - low sodium heart healthy  As directed    Increase activity slowly  As directed       Consultations:  None  Procedures Performed:  Dg Chest 2 View  06/14/2013   EXAM: CHEST  2 VIEW  COMPARISON:  June 12, 2013  FINDINGS: There is no pulmonary edema, or pleural effusion. There is patchy consolidation of the right lung base. The mediastinal contour and cardiac silhouette are stable. The soft tissues and osseous structures are stable.  IMPRESSION: Mild patchy opacity right lung base suspicious for early pneumonia.  ITEM CLINICAL DATA: Fever, crackles right   Electronically Signed   By: Sherian Rein   On: 06/14/2013 10:32   Dg Chest Portable 1 View  06/12/2013   CLINICAL DATA:  Back and chest pain.  EXAM: PORTABLE CHEST - 1 VIEW  COMPARISON:  None.  FINDINGS: Mild cardiomegaly. Prominent perihilar  and bibasilar interstitial markings without confluent airspace infiltrate. No effusion. Spurring in the mid and lower thoracic spine.  IMPRESSION: Cardiomegaly   Electronically Signed   By: Oley Balm M.D.   On: 06/12/2013 02:54   Ct Cta Abd/pel W/cm &/or W/o Cm  06/12/2013   CLINICAL DATA:  Back pain, diaphoresis, left shoulder pain.  EXAM: CT ANGIOGRAPHY CHEST, ABDOMEN AND PELVIS  TECHNIQUE: Multidetector CT imaging through the chest, abdomen and pelvis was performed using the standard protocol during bolus administration of intravenous contrast. Multiplanar reconstructed images including MIPs were obtained and reviewed to evaluate the vascular anatomy.  CONTRAST:  75mL OMNIPAQUE  IOHEXOL 300 MG/ML  SOLN  COMPARISON:  07/22/2000 by report only  FINDINGS: CTA CHEST FINDINGS  The non contrast scan shows no mediastinal hematoma, pneumothorax, pleural or pericardial effusion. No hyperdense crescent.  CTA shows normal caliber of the thoracic aorta without significant atheromatous irregularity, dissection, aneurysm, or stenosis. Classic 3 vessel brachiocephalic arterial origin anatomy without proximal stenosis. Satisfactory opacification of pulmonary arteries noted, and there is no evidence of pulmonary emboli ; the exam was not optimized for detection of pulmonary emboli. No pleural or pericardial effusion. No hilar or mediastinal adenopathy. Several subcentimeter low-attenuation left thyroid lesions are noted. Two small blebs in the right upper lobe. There is a nonspecific 6 mm subpleural nodule laterally in the right middle lobe image 49/6. Linear scarring or atelectasis in the inferolateral lingula. Spurring at multiple levels in the mid and lower thoracic spine.  Review of the MIP images confirms the above findings.  CTA ABDOMEN AND PELVIS FINDINGS  Arterial findings:  Aorta: Mildly tortuous. Normal in caliber and contour. No dissection, aneurysm, stenosis, or significant atheromatous irregularity.  Celiac axis:         Patent, unremarkable distal branching  Superior mesenteric: Patent.  Left renal:          Single, unremarkable.  Right renal:         Patent.  Inferior mesenteric: Patent.  Left iliac: Mildly tortuous, without aneurysm, dissection, stenosis, or significant atheromatous irregularity.  Right iliac:         Mildly tortuous.  Venous findings: Dedicated venous phase imaging was not obtained. Patent portal vein, splenic vein, and bilateral renal veins noted.  Review of the MIP images confirms the above findings.  Nonvascular findings: No retroperitoneal hemorrhage. Unremarkable arterial phase evaluation of liver, gallbladder, spleen, kidneys, adrenal glands, pancreas. Stomach, small  bowel, and colon unremarkable. Urinary bladder physiologically distended. No ascites. Hysterectomy. No free air. No adenopathy localized. 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  IMPRESSION: CTA CHEST IMPRESSION  Negative for thoracic dissection or pulmonary embolus.  CTA ABDOMEN AND PELVIS IMPRESSION  *Negative for dissection, aneurysm, or other acute vascular abnormality.  : Review of the MIP images confirms the above findings.   Electronically Signed   By: Oley Balm M.D.   On: 06/12/2013 06:38    Admission HPI:   Sarah Benton is a 67 y.o. female PMH HTN, HLD, GERD, GE junction stricture s/p endoscopic dilation in 10/2010, low back pain s/p lumbar laminectomy in 1998, who presents with back pain radiating to the chest.   Her back pain is located in right scapula and radiates around her right flank into her right chest. She describes it as a stabbing pain, "like someone twisting a knife", and rates the severity as 10/10. Her symptoms awoke her from sleep this morning at 12am and have been constant. Positional changes, movement, and palpation do  not relieve or exacerbate the pain. At home her pain was associated with diaphoresis, nausea, and 2 episodes of NBNB vomiting. She denies fevers, chills, rash, headache, left-sided chest pain, palpitations, leg swelling, orthopnea, abdominal pain. She denies any recent trauma or changes in her activity level. She has never had this pain before. Last month she did have left-sided, burning back, flank, and chest pain thought to be 2/2 shingles without rash vs. post-herpetic neuralgia. This resolved with Valtrex, gabapentin, and a methylprednisolone 80 injection x1. She was found to be varicella IgG positive and IgM negative at that time. Denies tobacco, alcohol, or drug use.   On the ride in EMS gave her SL NG, ASA, and Morphine 6mg . In the ED she also got Fentanyl IV x3, Dilaudid 1mg  IV x1, GI cocktail, Zofran. A nitro gtt was initiated. She says  after all this her pain is still present and severe at an 8/10. She was run through the [place] narcotic database and has no recent prescriptions for controlled substances.   Hospital Course by problem list:  1. Acute thoracic back pain radiating to the chest - Story was initially concerning for acute thoracic vascular abnormality. However, we ruled out aortic dissection and aortic aneurysm on CT imaging, as well as PE, pneumothorax, pneumonia, pleural effusion, rib fracture, cholecystitis, pancreatitis, retroperitoneal hemorrhage. Her EKG on admission showed sinus bradycardia s/p Fentanyl and some non-specific TWIs. POC troponins were negative x3. She had no known cardiac history. Her imaging did reveal some nonspecific bony spurring in the mid and lower thoracic spine, which could ostensibly cause nerve impingement. We had a neuroradiologist look at her CT scan again and he did not suspect such a process. She had a history of being Varicella IgG positive, and the description of her pain in a dermatomal distribution was suggestive of possible early shingles vs. post-herpetic neuralgia. Her pain did seem to improve with anti-virals and amitriptyline. We checked an HIV and it was negative. Though we have another explanation for her right-sided pain (early pneumonia, see problem 2 below), given the pain's dermatomal nature and improvement on shingles therapy we will did choose to complete a weeklong course of Valcyclovir 1000mg  3x/day and Amitriptyline 25mg  qhs. She was given a small supply of Norco for additional pain management until she could follow up with her PCP. Repeat Varicella IgM was negative, resulting after discharge.  2. Community acquired pneumonia - The patient had a low grade fever night of admission, which resolved without Tylenol. The next morning she was noted to have a leukocytosis to 19.0. We had no symptomatic clues as to the infectious source as she denied headache, neck pain, cough, abdominal  pain, vomiting, diarrhea, dysuria, frequency, rash, and her pain on admission had largely resolved by hospital day 2. We curbsided the radiologist on call who re-reviewed her CT; he saw no epidural, chest wall, muscular, or soft tissue abscess that could connect acute thoracic pain to transient fever and leukocytosis. The following night, she again developed a fever to 103, and blood cultures were drawn. The fever resolved with Tylenol. She had a recent trip to [place], so we began to wonder if a tick-borne illness such as Babesiosis (given cyclic fevers) or lyme disease was at play. The morning after her 103 fever she endorsed muscle aches and fatigue. She denied SOB but was noted to have some decreased O2 sats overnight (88-93%). New crackles were heard on lung exam, mostly RLL. A chest x-ray was repeated and showed a  mild patchy opacity right lung base suspicious for early pneumonia. We started her on Levaquin 750mg  daily. She developed no more fevers. Her WBC down trended to 16.5 the morning of discharge. She felt better in terms of fatigue and muscle aches, and she still denied cough. Strep pneumo and legionella antigens were negative. All cultures (blood, urine) NGTD. She was discharged on day 2/5 of Levaquin.  3. HTN - Stable at 100-120s/40-80s. We held her home HCTZ/lisinopril given multiple vaso-depressing medications given on admission. These were resumed upon discharge.   Discharge Vitals:   BP 106/81  Pulse 104  Temp(Src) 99 F (37.2 C) (Oral)  Resp 18  Ht 5' 3.5" (1.613 m)  Wt 223 lb 3.2 oz (101.243 kg)  BMI 38.91 kg/m2  SpO2 94%  Discharge Labs:  Results for orders placed during the hospital encounter of 06/12/13 (from the past 24 hour(s))  STREP PNEUMONIAE URINARY ANTIGEN     Status: None   Collection Time    06/14/13  6:17 PM      Result Value Range   Strep Pneumo Urinary Antigen NEGATIVE  NEGATIVE  CBC     Status: Abnormal   Collection Time    06/15/13  5:20 AM      Result  Value Range   WBC 16.5 (*) 4.0 - 10.5 K/uL   RBC 3.89  3.87 - 5.11 MIL/uL   Hemoglobin 10.9 (*) 12.0 - 15.0 g/dL   HCT 16.1 (*) 09.6 - 04.5 %   MCV 84.3  78.0 - 100.0 fL   MCH 28.0  26.0 - 34.0 pg   MCHC 33.2  30.0 - 36.0 g/dL   RDW 40.9  81.1 - 91.4 %   Platelets 197  150 - 400 K/uL    Signed: Vivi Barrack, MD 06/15/2013, 11:05 AM   Time Spent on Discharge: 30 minutes Services Ordered on Discharge: None Equipment Ordered on Discharge: None

## 2013-06-15 NOTE — Progress Notes (Signed)
Patient is ready for DC home accompanied by family. Patient reports she understands all discharge instructions, follow up appointments, and Rx medications. I reviewed Vicodin Rx with the patient and she reports she understands how to safely take the medication.   Torie Warden/ranger

## 2013-06-15 NOTE — Progress Notes (Signed)
Utilization review completed.  

## 2013-06-15 NOTE — Progress Notes (Addendum)
Subjective: Patient seen at bedside. She feels a little better this morning overall. She thinks her original back/flank/chest pain may have come back a little bit last night, but it was much less severe and resolved with pain medication. This morning she feels a little achy in her back and lumbar spine, different from her original pain. She still denies headache, neck pain, cough, abdominal pain, vomiting, diarrhea, dysuria, frequency, rash. She has been afebrile for >24 hours.  Objective: Vital signs in last 24 hours: Filed Vitals:   06/14/13 0600 06/14/13 1311 06/14/13 2103 06/15/13 0433  BP:  126/47 98/56 106/81  Pulse:  98 99 104  Temp: 99 F (37.2 C) 100.2 F (37.9 C) 99 F (37.2 C) 99 F (37.2 C)  TempSrc:  Oral Oral Oral  Resp:  18 18 18   Height:      Weight:    223 lb 3.2 oz (101.243 kg)  SpO2:  94% 94% 94%   Weight change:   Intake/Output Summary (Last 24 hours) at 06/15/13 0953 Last data filed at 06/15/13 0900  Gross per 24 hour  Intake    480 ml  Output    350 ml  Net    130 ml   Physical Exam  Constitutional: She is oriented to person, place, and time and well-developed, well-nourished, and in no distress.  HENT:  Head: Normocephalic and atraumatic.  Eyes: Conjunctivae and EOM are normal. Pupils are equal, round, and reactive to light.  Neck: Normal range of motion. Neck supple.  Cardiovascular: Normal rate, regular rhythm and intact distal pulses. Exam reveals no gallop and no friction rub.  No murmur heard.  Pulmonary/Chest: Effort normal. No respiratory distress. She has no wheezes. Soft crackles in her RML/RLL, slightly improved from yesterday. She exhibits no tenderness.  Abdominal: Soft. Bowel sounds are normal. She exhibits no distension and no mass. There is no tenderness. There is no rebound and no guarding.  Musculoskeletal: Normal range of motion. She exhibits no edema and no tenderness to palpation in right back, flank, or chest. Neurological: She  is alert and oriented to person, place, and time. No cranial nerve deficit. GCS score is 15.  Skin: Skin is warm and dry. No rash noted. She is not diaphoretic.   Lab Results: Basic Metabolic Panel:  Recent Labs Lab 06/12/13 0247  NA 141  K 4.4  CL 105  CO2 25  GLUCOSE 157*  BUN 23  CREATININE 1.05  CALCIUM 8.9   Liver Function Tests:  Recent Labs Lab 06/12/13 0247  AST 16  ALT 17  ALKPHOS 116  BILITOT 0.3  PROT 6.5  ALBUMIN 3.6    Recent Labs Lab 06/12/13 0247  LIPASE 35   CBC:  Recent Labs Lab 06/14/13 0426 06/15/13 0520  WBC 18.5* 16.5*  NEUTROABS 16.2*  --   HGB 11.2* 10.9*  HCT 34.1* 32.8*  MCV 85.0 84.3  PLT 196 197   Cardiac Enzymes:  Recent Labs Lab 06/12/13 1058 06/12/13 1530  TROPONINI <0.30 <0.30    Micro Results: Recent Results (from the past 240 hour(s))  CULTURE, BLOOD (ROUTINE X 2)     Status: None   Collection Time    06/13/13  6:20 AM      Result Value Range Status   Specimen Description BLOOD LEFT HAND   Final   Special Requests BOTTLES DRAWN AEROBIC AND ANAEROBIC [facility] EACH   Final   Culture  Setup Time     Final   Value: 06/13/2013  18:10     Performed at [facility]     Final   Value:        BLOOD CULTURE RECEIVED NO GROWTH TO DATE CULTURE WILL BE HELD FOR 5 DAYS BEFORE ISSUING A FINAL NEGATIVE REPORT     Performed at [facility]   Report Status PENDING   Incomplete  CULTURE, BLOOD (ROUTINE X 2)     Status: None   Collection Time    06/13/13  8:45 AM      Result Value Range Status   Specimen Description BLOOD LEFT HAND   Final   Special Requests BOTTLES DRAWN AEROBIC ONLY 10CC   Final   Culture  Setup Time     Final   Value: 06/13/2013 21:45     Performed at [facility]   Culture     Final   Value:        BLOOD CULTURE RECEIVED NO GROWTH TO DATE CULTURE WILL BE HELD FOR 5 DAYS BEFORE ISSUING A FINAL NEGATIVE REPORT     Performed at [facility]   Report Status PENDING    Incomplete  URINE CULTURE     Status: None   Collection Time    06/13/13 11:31 PM      Result Value Range Status   Specimen Description URINE, RANDOM   Final   Special Requests ADDED 0018 06/14/13   Final   Culture  Setup Time     Final   Value: 06/14/2013 01:02     Performed at [facility]   Colony Count     Final   Value: NO GROWTH     Performed at [facility]   Culture     Final   Value: NO GROWTH     Performed at [facility]   Report Status 06/15/2013 FINAL   Final   Studies/Results: Dg Chest 2 View  06/14/2013   EXAM: CHEST  2 VIEW  COMPARISON:  June 12, 2013  FINDINGS: There is no pulmonary edema, or pleural effusion. There is patchy consolidation of the right lung base. The mediastinal contour and cardiac silhouette are stable. The soft tissues and osseous structures are stable.  IMPRESSION: Mild patchy opacity right lung base suspicious for early pneumonia.  ITEM CLINICAL DATA: Fever, crackles right   Electronically Signed   By: Sherian Rein   On: 06/14/2013 10:32   Medications: I have reviewed the patient's current medications. Scheduled Meds: . amitriptyline  25 mg Oral QHS  . atorvastatin  40 mg Oral q1800  . enoxaparin (LOVENOX) injection  40 mg Subcutaneous Daily  . levofloxacin  750 mg Oral Q24H  . pantoprazole  40 mg Oral Daily  . senna  1 tablet Oral BID  . sodium chloride  3 mL Intravenous Q12H  . valACYclovir  1,000 mg Oral TID   Continuous Infusions:  PRN Meds:.acetaminophen, HYDROcodone-acetaminophen, ondansetron  Assessment/Plan: Sarah Benton is a 67 y.o. female PMH HTN, HLD, GERD, GE junction stricture s/p endoscopic dilation in 10/2010, low back pain s/p lumbar laminectomy in 1998, who presents with back pain radiating to the chest.  #Community acquired pneumonia - Today is day 2/5 of Levaquin. WBC down trending 18.5>16.5 this morning. She has been afebrile for >24 hours. She feels better in terms of fatigue and muscle  aches. Lung exam slightly improved. Still without cough. Strep pneumo antigen negative. All cultures (blood, urine) NGTD.  - Continue po Levaquin -  Follow up respiratory virus panel - Follow up legionella ag - Medically stable for discharge today  #Acute thoracic back pain radiating to the chest - Improved with anti-virals and amitriptyline. She is HIV negative. Though we have another explanation for her right-sided pain (pneumonia), given the pain's dermatomal nature and improvement on VZV therapy we will complete the course on discharge. - Follow up repeat Varicella IgM ab testing (in process) - Continue Valcyclovir 1000mg  3x/day for 1 week (today is day 4) - Continue Amitriptyline 25mg  qhs for 1 week (today is day 4) - Regular diet  - Zofran prn nausea  - Norco 5 po q4h prn pain  #HTN - Stable. Currently 106/81. - Holding home HCTZ/lisinpril given recent multiple vaso-depressing medications   #HLD - Continue statin.  #GERD - Protonix 40mg  daily.  #DVT PPX - Lovenox subq.   Dispo: Disposition is deferred at this time, awaiting improvement of current medical problems.  Anticipated discharge in approximately 1-3 day(s).   The patient does have a current PCP Ernestina Penna, MD) and does need an [facility] hospital follow-up appointment after discharge.  The patient does not have transportation limitations that hinder transportation to clinic appointments.  .Services Needed at time of discharge: Y = Yes, Blank = No PT:   OT:   RN:   Equipment:   Other:     LOS: 3 days   Vivi Barrack, MD 06/15/2013, 9:53 AM

## 2013-06-19 LAB — CULTURE, BLOOD (ROUTINE X 2)
Culture: NO GROWTH
Culture: NO GROWTH

## 2013-06-21 ENCOUNTER — Ambulatory Visit (INDEPENDENT_AMBULATORY_CARE_PROVIDER_SITE_OTHER): Payer: PRIVATE HEALTH INSURANCE | Admitting: Nurse Practitioner

## 2013-06-21 ENCOUNTER — Encounter: Payer: Self-pay | Admitting: Nurse Practitioner

## 2013-06-21 VITALS — BP 102/60 | HR 76 | Temp 97.8°F | Ht 63.5 in | Wt 224.0 lb

## 2013-06-21 DIAGNOSIS — E782 Mixed hyperlipidemia: Secondary | ICD-10-CM

## 2013-06-21 DIAGNOSIS — I10 Essential (primary) hypertension: Secondary | ICD-10-CM

## 2013-06-21 DIAGNOSIS — K219 Gastro-esophageal reflux disease without esophagitis: Secondary | ICD-10-CM

## 2013-06-21 MED ORDER — OMEPRAZOLE 20 MG PO CPDR: 20.0000 mg | DELAYED_RELEASE_CAPSULE | Freq: Every day | ORAL | Status: DC

## 2013-06-21 MED ORDER — LISINOPRIL-HYDROCHLOROTHIAZIDE 20-12.5 MG PO TABS: 1.0000 | ORAL_TABLET | Freq: Every day | ORAL | Status: DC

## 2013-06-21 MED ORDER — MELOXICAM 15 MG PO TABS: 15.0000 mg | ORAL_TABLET | Freq: Every day | ORAL | Status: DC

## 2013-06-21 MED ORDER — ROSUVASTATIN CALCIUM 20 MG PO TABS: 20.0000 mg | ORAL_TABLET | Freq: Every day | ORAL | Status: DC

## 2013-06-21 NOTE — Patient Instructions (Signed)

## 2013-06-21 NOTE — Discharge Summary (Signed)
I saw Sarah Benton on day of discharge and assisted in the discharge plan.

## 2013-06-21 NOTE — Progress Notes (Signed)
Subjective:    Patient ID: Sarah Benton, female    DOB: 02-07-1946, 67 y.o.   MRN: [id number]  Hypertension This is a chronic problem. The current episode started more than 1 year ago. The problem is unchanged. The problem is controlled. Pertinent negatives include no blurred vision, chest pain, headaches, neck pain, palpitations, peripheral edema or shortness of breath. There are no associated agents to hypertension. Risk factors for coronary artery disease include dyslipidemia, family history, obesity and post-menopausal state. Past treatments include ACE inhibitors and diuretics. The current treatment provides significant improvement. Compliance problems include diet and exercise.   Hyperlipidemia This is a chronic problem. The current episode started more than 1 year ago. The problem is controlled. Recent lipid tests were reviewed and are normal. Exacerbating diseases include obesity. Pertinent negatives include no chest pain or shortness of breath. Current antihyperlipidemic treatment includes statins. The current treatment provides moderate improvement of lipids. Compliance problems include adherence to diet and adherence to exercise.  Risk factors for coronary artery disease include hypertension, obesity and post-menopausal.  GERD Omeprazole works well to kep symptoms under control  * patient was having chest last week and went to hospital- dx with shingles and pneumonia   Review of Systems  HENT: Negative for neck pain.   Eyes: Negative for blurred vision.  Respiratory: Negative for shortness of breath.   Cardiovascular: Negative for chest pain and palpitations.  Neurological: Negative for headaches.  All other systems reviewed and are negative.       Objective:   Physical Exam  Constitutional: She is oriented to person, place, and time. She appears well-developed and well-nourished.  HENT:  Nose: Nose normal.  Mouth/Throat: Oropharynx is clear and moist.  Eyes: EOM are  normal.  Neck: Trachea normal, normal range of motion and full passive range of motion without pain. Neck supple. No JVD present. Carotid bruit is not present. No thyromegaly present.  Cardiovascular: Normal rate, regular rhythm, normal heart sounds and intact distal pulses.  Exam reveals no gallop and no friction rub.   No murmur heard. Pulmonary/Chest: Effort normal and breath sounds normal.  Abdominal: Soft. Bowel sounds are normal. She exhibits no distension and no mass. There is no tenderness.  Musculoskeletal: Normal range of motion.  Lymphadenopathy:    She has no cervical adenopathy.  Neurological: She is alert and oriented to person, place, and time. She has normal reflexes.  Skin: Skin is warm and dry.  Psychiatric: She has a normal mood and affect. Her behavior is normal. Judgment and thought content normal.    BP 102/60  Pulse 76  Temp(Src) 97.8 F (36.6 C) (Oral)  Ht 5' 3.5" (1.613 m)  Wt 224 lb (101.606 kg)  BMI 39.05 kg/m2       Assessment & Plan:   1. Mixed hyperlipidemia   2. Essential hypertension, benign   3. GERD    Orders Placed This Encounter  Procedures  . CMP14+EGFR  . NMR, lipoprofile   Meds ordered this encounter  Medications  . lisinopril-hydrochlorothiazide (PRINZIDE,ZESTORETIC) 20-12.5 MG per tablet    Sig: Take 1 tablet by mouth daily.    Dispense:  90 tablet    Refill:  1  . meloxicam (MOBIC) 15 MG tablet    Sig: Take 1 tablet (15 mg total) by mouth daily.    Dispense:  90 tablet    Refill:  1  . rosuvastatin (CRESTOR) 20 MG tablet    Sig: Take 1 tablet (20 mg total) by mouth  daily.    Dispense:  90 tablet    Refill:  1  . omeprazole (PRILOSEC) 20 MG capsule    Sig: Take 1 capsule (20 mg total) by mouth daily.    Dispense:  90 capsule    Refill:  1    Order Specific Question:  Supervising Provider    Answer:  Deborra Medina    Continue all meds Labs pending Diet and exercise encouraged Health maintenance  reviewed Follow up in 3 months  Sarah Daphine Deutscher, FNP

## 2013-06-23 LAB — CMP14+EGFR
AST: 43 IU/L — ABNORMAL HIGH (ref 0–40)
Albumin/Globulin Ratio: 1.3 (ref 1.1–2.5)
Alkaline Phosphatase: 215 IU/L — ABNORMAL HIGH (ref 39–117)
Calcium: 9.3 mg/dL (ref 8.6–10.2)
Creatinine, Ser: 0.87 mg/dL (ref 0.57–1.00)
GFR calc Af Amer: 79 mL/min/{1.73_m2} (ref >59)
GFR calc non Af Amer: 68 mL/min/{1.73_m2} (ref >59)
Globulin, Total: 2.7 g/dL (ref 1.5–4.5)
Sodium: 141 mmol/L (ref 134–144)
Total Bilirubin: 0.4 mg/dL (ref 0.0–1.2)
Total Protein: 6.3 g/dL (ref 6.0–8.5)

## 2013-06-23 LAB — NMR, LIPOPROFILE
Cholesterol: 164 mg/dL (ref <200)
HDL Cholesterol by NMR: 26 mg/dL — ABNORMAL LOW (ref >=40)
HDL Particle Number: 15.8 umol/L — ABNORMAL LOW (ref >=30.5)
LDL Size: 21.4 nm (ref >20.5)
Small LDL Particle Number: 1142 nmol/L — ABNORMAL HIGH (ref <=527)
Triglycerides by NMR: 162 mg/dL — ABNORMAL HIGH (ref <150)

## 2013-06-27 ENCOUNTER — Encounter: Payer: Self-pay | Admitting: Nurse Practitioner

## 2013-07-21 ENCOUNTER — Other Ambulatory Visit: Payer: Self-pay

## 2013-07-21 DIAGNOSIS — Z1231 Encounter for screening mammogram for malignant neoplasm of breast: Secondary | ICD-10-CM

## 2013-08-17 ENCOUNTER — Ambulatory Visit: Payer: Medicare Other

## 2013-08-24 ENCOUNTER — Ambulatory Visit: Payer: Medicare Other

## 2013-09-03 ENCOUNTER — Ambulatory Visit
Admission: RE | Admit: 2013-09-03 | Discharge: 2013-09-03 | Disposition: A | Discharge status: Home / Self Care | Payer: Medicare Other | Source: Ambulatory Visit

## 2013-09-03 DIAGNOSIS — Z1231 Encounter for screening mammogram for malignant neoplasm of breast: Secondary | ICD-10-CM

## 2013-11-27 ENCOUNTER — Other Ambulatory Visit: Payer: Self-pay | Admitting: Nurse Practitioner

## 2013-12-20 ENCOUNTER — Encounter: Payer: Self-pay | Admitting: Nurse Practitioner

## 2013-12-20 ENCOUNTER — Other Ambulatory Visit: Payer: Self-pay | Admitting: Nurse Practitioner

## 2013-12-20 MED ORDER — LISINOPRIL-HYDROCHLOROTHIAZIDE 20-12.5 MG PO TABS: 1.0000 | ORAL_TABLET | Freq: Every day | ORAL | Status: DC

## 2013-12-20 MED ORDER — ROSUVASTATIN CALCIUM 20 MG PO TABS: 20.0000 mg | ORAL_TABLET | Freq: Every day | ORAL | Status: DC

## 2013-12-20 MED ORDER — OMEPRAZOLE 20 MG PO CPDR: 20.0000 mg | DELAYED_RELEASE_CAPSULE | Freq: Every day | ORAL | Status: DC

## 2013-12-20 MED ORDER — MELOXICAM 15 MG PO TABS: 15.0000 mg | ORAL_TABLET | Freq: Every day | ORAL | Status: DC

## 2013-12-21 ENCOUNTER — Other Ambulatory Visit: Payer: Self-pay | Admitting: Nurse Practitioner

## 2013-12-21 MED ORDER — OMEPRAZOLE 20 MG PO CPDR: 20.0000 mg | DELAYED_RELEASE_CAPSULE | Freq: Every day | ORAL | Status: DC

## 2013-12-21 MED ORDER — MELOXICAM 15 MG PO TABS: 15.0000 mg | ORAL_TABLET | Freq: Every day | ORAL | Status: DC

## 2013-12-21 MED ORDER — LISINOPRIL-HYDROCHLOROTHIAZIDE 20-12.5 MG PO TABS: 1.0000 | ORAL_TABLET | Freq: Every day | ORAL | Status: DC

## 2013-12-21 MED ORDER — ROSUVASTATIN CALCIUM 20 MG PO TABS: 20.0000 mg | ORAL_TABLET | Freq: Every day | ORAL | Status: DC

## 2014-01-03 ENCOUNTER — Encounter: Payer: Self-pay | Admitting: Nurse Practitioner

## 2014-01-07 ENCOUNTER — Encounter: Payer: Self-pay | Admitting: Nurse Practitioner

## 2014-01-07 ENCOUNTER — Ambulatory Visit (INDEPENDENT_AMBULATORY_CARE_PROVIDER_SITE_OTHER): Payer: PRIVATE HEALTH INSURANCE | Admitting: Nurse Practitioner

## 2014-01-07 VITALS — BP 101/48 | HR 58 | Temp 98.7°F | Ht 63.5 in | Wt 226.2 lb

## 2014-01-07 DIAGNOSIS — R635 Abnormal weight gain: Secondary | ICD-10-CM

## 2014-01-07 DIAGNOSIS — E782 Mixed hyperlipidemia: Secondary | ICD-10-CM

## 2014-01-07 DIAGNOSIS — E663 Overweight: Secondary | ICD-10-CM

## 2014-01-07 DIAGNOSIS — K219 Gastro-esophageal reflux disease without esophagitis: Secondary | ICD-10-CM

## 2014-01-07 DIAGNOSIS — M549 Dorsalgia, unspecified: Secondary | ICD-10-CM

## 2014-01-07 DIAGNOSIS — I10 Essential (primary) hypertension: Secondary | ICD-10-CM

## 2014-01-07 DIAGNOSIS — Z713 Dietary counseling and surveillance: Secondary | ICD-10-CM

## 2014-01-07 DIAGNOSIS — Z13820 Encounter for screening for osteoporosis: Secondary | ICD-10-CM

## 2014-01-07 MED ORDER — ROSUVASTATIN CALCIUM 20 MG PO TABS: 20.0000 mg | ORAL_TABLET | Freq: Every day | ORAL | Status: DC

## 2014-01-07 MED ORDER — OMEPRAZOLE 20 MG PO CPDR: 20.0000 mg | DELAYED_RELEASE_CAPSULE | Freq: Every day | ORAL | Status: DC

## 2014-01-07 MED ORDER — LISINOPRIL-HYDROCHLOROTHIAZIDE 20-12.5 MG PO TABS: 1.0000 | ORAL_TABLET | Freq: Every day | ORAL | Status: DC

## 2014-01-07 MED ORDER — MELOXICAM 15 MG PO TABS: 15.0000 mg | ORAL_TABLET | Freq: Every day | ORAL | Status: DC

## 2014-01-07 NOTE — Progress Notes (Signed)
Subjective:    Patient ID: Sarah Benton, female    DOB: 04-26-1946, 68 y.o.   MRN: [id number]  Patient here today for follow up of chronic medical problems.  Hypertension This is a chronic problem. The current episode started more than 1 year ago. The problem is unchanged. The problem is controlled. Pertinent negatives include no blurred vision, chest pain, headaches, neck pain, palpitations, peripheral edema or shortness of breath. There are no associated agents to hypertension. Risk factors for coronary artery disease include dyslipidemia, family history, obesity and post-menopausal state. Past treatments include ACE inhibitors and diuretics. The current treatment provides significant improvement. Compliance problems include diet and exercise.   Hyperlipidemia This is a chronic problem. The current episode started more than 1 year ago. The problem is controlled. Recent lipid tests were reviewed and are normal. Exacerbating diseases include obesity. Pertinent negatives include no chest pain or shortness of breath. Current antihyperlipidemic treatment includes statins. The current treatment provides moderate improvement of lipids. Compliance problems include adherence to diet and adherence to exercise.  Risk factors for coronary artery disease include hypertension, obesity and post-menopausal.  GERD Omeprazole works well to kep symptoms under control    Review of Systems  Eyes: Negative for blurred vision.  Respiratory: Negative for shortness of breath.   Cardiovascular: Negative for chest pain and palpitations.  Musculoskeletal: Negative for neck pain.  Neurological: Negative for headaches.  All other systems reviewed and are negative.      Objective:   Physical Exam  Constitutional: She is oriented to person, place, and time. She appears well-developed and well-nourished.  HENT:  Nose: Nose normal.  Mouth/Throat: Oropharynx is clear and moist.  Eyes: EOM are normal.  Neck:  Trachea normal, normal range of motion and full passive range of motion without pain. Neck supple. No JVD present. Carotid bruit is not present. No thyromegaly present.  Cardiovascular: Normal rate, regular rhythm, normal heart sounds and intact distal pulses.  Exam reveals no gallop and no friction rub.   No murmur heard. Pulmonary/Chest: Effort normal and breath sounds normal.  Abdominal: Soft. Bowel sounds are normal. She exhibits no distension and no mass. There is no tenderness.  Musculoskeletal: Normal range of motion.  Lymphadenopathy:    She has no cervical adenopathy.  Neurological: She is alert and oriented to person, place, and time. She has normal reflexes.  Skin: Skin is warm and dry.  Psychiatric: She has a normal mood and affect. Her behavior is normal. Judgment and thought content normal.    BP 101/48  Pulse 58  Temp(Src) 98.7 F (37.1 C) (Oral)  Ht 5' 3.5" (1.613 m)  Wt 226 lb 3.2 oz (102.604 kg)  BMI 39.44 kg/m2       Assessment & Plan:   1. Screening for osteoporosis   2. Overweight   3. Mixed hyperlipidemia   4. GERD   5. Essential hypertension, benign   6. Back pain   7. Weight loss counseling, encounter for    Orders Placed This Encounter  Procedures  . DG Bone Density    Standing Status: Future     Number of Occurrences:      Standing Expiration Date: 03/09/2015    Order Specific Question:  Reason for Exam (SYMPTOM  OR DIAGNOSIS REQUIRED)    Answer:  screening    Order Specific Question:  Preferred imaging location?    Answer:  Internal  . CMP14+EGFR  . NMR, lipoprofile   Meds ordered this encounter  Medications  .  lisinopril-hydrochlorothiazide (PRINZIDE,ZESTORETIC) 20-12.5 MG per tablet    Sig: Take 1 tablet by mouth daily.    Dispense:  30 tablet    Refill:  5    Order Specific Question:  Supervising Provider    Answer:  Chipper Herb [[id number]]  . omeprazole (PRILOSEC) 20 MG capsule    Sig: Take 1 capsule (20 mg total) by mouth daily.     Dispense:  30 capsule    Refill:  5    Order Specific Question:  Supervising Provider    Answer:  Chipper Herb [[id number]]  . rosuvastatin (CRESTOR) 20 MG tablet    Sig: Take 1 tablet (20 mg total) by mouth daily.    Dispense:  30 tablet    Refill:  5    Order Specific Question:  Supervising Provider    Answer:  Chipper Herb [[id number]]  . meloxicam (MOBIC) 15 MG tablet    Sig: Take 1 tablet (15 mg total) by mouth daily.    Dispense:  30 tablet    Refill:  5    Order Specific Question:  Supervising Provider    Answer:  Chipper Herb [[id number]]    Labs pending Health maintenance reviewed Diet and exercise encouraged Continue all meds Follow up  In 3 months   [place], FNP

## 2014-01-07 NOTE — Patient Instructions (Signed)

## 2014-01-08 LAB — CMP14+EGFR
ALBUMIN: 4.1 g/dL (ref 3.5–4.8)
ALK PHOS: 134 IU/L — AB (ref 39–117)
ALT: 18 IU/L (ref 0–32)
AST: 17 IU/L (ref 0–40)
Albumin/Globulin Ratio: 2 (ref 1.1–2.5)
BUN/Creatinine Ratio: 20 (ref 11–26)
BUN: 17 mg/dL (ref 8–27)
CO2: 23 mmol/L (ref 18–29)
Calcium: 9.1 mg/dL (ref 8.7–10.3)
Chloride: 102 mmol/L (ref 97–108)
Creatinine, Ser: 0.83 mg/dL (ref 0.57–1.00)
GFR calc Af Amer: 83 mL/min/{1.73_m2} (ref >59)
GFR calc non Af Amer: 72 mL/min/{1.73_m2} (ref >59)
Globulin, Total: 2.1 g/dL (ref 1.5–4.5)
Glucose: 86 mg/dL (ref 65–99)
POTASSIUM: 4.3 mmol/L (ref 3.5–5.2)
Sodium: 142 mmol/L (ref 134–144)
Total Bilirubin: 0.4 mg/dL (ref 0.0–1.2)
Total Protein: 6.2 g/dL (ref 6.0–8.5)

## 2014-01-08 LAB — NMR, LIPOPROFILE
CHOLESTEROL: 161 mg/dL (ref <200)
HDL Cholesterol by NMR: 48 mg/dL (ref >=40)
HDL Particle Number: 38.1 umol/L (ref >=30.5)
LDL PARTICLE NUMBER: 1137 nmol/L — AB (ref <1000)
LDL SIZE: 20.9 nm (ref >20.5)
LDLC SERPL CALC-MCNC: 85 mg/dL (ref <100)
LP-IR SCORE: 61 — AB (ref <=45)
Small LDL Particle Number: 724 nmol/L — ABNORMAL HIGH (ref <=527)
Triglycerides by NMR: 142 mg/dL (ref <150)

## 2014-03-02 ENCOUNTER — Other Ambulatory Visit: Payer: PRIVATE HEALTH INSURANCE

## 2014-03-02 ENCOUNTER — Ambulatory Visit: Payer: PRIVATE HEALTH INSURANCE

## 2014-06-03 ENCOUNTER — Other Ambulatory Visit: Payer: Self-pay

## 2014-06-03 DIAGNOSIS — Z1231 Encounter for screening mammogram for malignant neoplasm of breast: Secondary | ICD-10-CM

## 2014-07-25 ENCOUNTER — Encounter: Payer: Self-pay | Admitting: Nurse Practitioner

## 2014-07-25 ENCOUNTER — Ambulatory Visit (INDEPENDENT_AMBULATORY_CARE_PROVIDER_SITE_OTHER): Payer: PRIVATE HEALTH INSURANCE | Admitting: Family Medicine

## 2014-07-25 ENCOUNTER — Ambulatory Visit (INDEPENDENT_AMBULATORY_CARE_PROVIDER_SITE_OTHER): Payer: PRIVATE HEALTH INSURANCE | Admitting: Nurse Practitioner

## 2014-07-25 VITALS — BP 120/67 | HR 61 | Temp 97.7°F | Ht 63.5 in | Wt 226.0 lb

## 2014-07-25 DIAGNOSIS — Z13820 Encounter for screening for osteoporosis: Secondary | ICD-10-CM

## 2014-07-25 DIAGNOSIS — I10 Essential (primary) hypertension: Secondary | ICD-10-CM

## 2014-07-25 DIAGNOSIS — E782 Mixed hyperlipidemia: Secondary | ICD-10-CM

## 2014-07-25 DIAGNOSIS — Z23 Encounter for immunization: Secondary | ICD-10-CM

## 2014-07-25 DIAGNOSIS — M545 Low back pain: Secondary | ICD-10-CM

## 2014-07-25 DIAGNOSIS — K219 Gastro-esophageal reflux disease without esophagitis: Secondary | ICD-10-CM

## 2014-07-25 MED ORDER — MELOXICAM 15 MG PO TABS: 15.0000 mg | ORAL_TABLET | Freq: Every day | ORAL | Status: DC

## 2014-07-25 MED ORDER — LISINOPRIL-HYDROCHLOROTHIAZIDE 20-12.5 MG PO TABS: 1.0000 | ORAL_TABLET | Freq: Every day | ORAL | Status: DC

## 2014-07-25 MED ORDER — OMEPRAZOLE 20 MG PO CPDR: 20.0000 mg | DELAYED_RELEASE_CAPSULE | Freq: Every day | ORAL | Status: DC

## 2014-07-25 MED ORDER — ROSUVASTATIN CALCIUM 20 MG PO TABS: 20.0000 mg | ORAL_TABLET | Freq: Every day | ORAL | Status: DC

## 2014-07-25 NOTE — Progress Notes (Signed)
Subjective:    Patient ID: Sarah Benton, female    DOB: 11-15-1945, 68 y.o.   MRN: [id number]   Patient here today for follow up of chronic medical problems.   Hypertension This is a chronic problem. The current episode started more than 1 year ago. The problem is controlled. Pertinent negatives include no chest pain, headaches, neck pain, palpitations or shortness of breath. Risk factors for coronary artery disease include dyslipidemia, obesity and post-menopausal state. Past treatments include ACE inhibitors and diuretics. The current treatment provides moderate improvement. Compliance problems include diet and exercise.   Hyperlipidemia This is a chronic problem. The current episode started more than 1 year ago. The problem is uncontrolled. Recent lipid tests were reviewed and are normal. She has no history of diabetes, hypothyroidism or obesity. Pertinent negatives include no chest pain or shortness of breath. Current antihyperlipidemic treatment includes statins. The current treatment provides moderate improvement of lipids. Compliance problems include adherence to diet and adherence to exercise.  Risk factors for coronary artery disease include dyslipidemia, hypertension, obesity and post-menopausal.  GERD Omeprazole works well to kep symptoms under control Insomnia Amitriptyline nightly- helps her rest well.    Review of Systems  Respiratory: Negative for shortness of breath.   Cardiovascular: Negative for chest pain and palpitations.  Musculoskeletal: Negative for neck pain.  Neurological: Negative for headaches.  All other systems reviewed and are negative.      Objective:   Physical Exam  Constitutional: She is oriented to person, place, and time. She appears well-developed and well-nourished.  HENT:  Head: Normocephalic.  Right Ear: Hearing, tympanic membrane, external ear and ear canal normal.  Left Ear: Hearing, tympanic membrane, external ear and ear canal normal.   Nose: Nose normal.  Mouth/Throat: Uvula is midline, oropharynx is clear and moist and mucous membranes are normal.  Eyes: Conjunctivae and EOM are normal. Pupils are equal, round, and reactive to light.  Neck: Normal range of motion. Neck supple. No JVD present. No thyromegaly present.  Cardiovascular: Normal rate, normal heart sounds and intact distal pulses.   No murmur heard. Pulmonary/Chest: Effort normal and breath sounds normal. She has no wheezes. She has no rales.  Abdominal: Soft. Bowel sounds are normal. She exhibits no mass.  Musculoskeletal: Normal range of motion.  Neurological: She is alert and oriented to person, place, and time. She has normal reflexes.  Skin: Skin is warm and dry.  Psychiatric: She has a normal mood and affect. Her behavior is normal. Judgment and thought content normal.    BP 120/67 mmHg  Pulse 61  Temp(Src) 97.7 F (36.5 C) (Oral)  Ht 5' 3.5" (1.613 m)  Wt 226 lb (102.513 kg)  BMI 39.40 kg/m2       Assessment & Plan:   1. Screening for osteoporosis Weight bearing exercises  2. Mixed hyperlipidemia Low fat diet - NMR, lipoprofile - rosuvastatin (CRESTOR) 20 MG tablet; Take 1 tablet (20 mg total) by mouth daily.  Dispense: 30 tablet; Refill: 5  3. Gastroesophageal reflux disease without esophagitis Avoid spicy and fatty foods - omeprazole (PRILOSEC) 20 MG capsule; Take 1 capsule (20 mg total) by mouth daily.  Dispense: 30 capsule; Refill: 5  4. Essential hypertension, benign Low Na+ diet - CMP14+EGFR - lisinopril-hydrochlorothiazide (PRINZIDE,ZESTORETIC) 20-12.5 MG per tablet; Take 1 tablet by mouth daily.  Dispense: 30 tablet; Refill: 5  - 5. Low back pain, unspecified back pain laterality, with sciatica presence unspecified  - meloxicam (MOBIC) 15 MG tablet; Take 1 tablet (  15 mg total) by mouth daily.  Dispense: 30 tablet; Refill: 5    Labs pending Health maintenance reviewed Diet and exercise encouraged Continue all  meds Follow up  In 3 months    [place], FNP

## 2014-07-26 LAB — CMP14+EGFR
A/G RATIO: 2 (ref 1.1–2.5)
ALBUMIN: 4.1 g/dL (ref 3.5–4.8)
ALT: 14 IU/L (ref 0–32)
AST: 14 IU/L (ref 0–40)
Alkaline Phosphatase: 119 IU/L — ABNORMAL HIGH (ref 39–117)
BUN/Creatinine Ratio: 15 (ref 11–26)
BUN: 15 mg/dL (ref 8–27)
CALCIUM: 9.3 mg/dL (ref 8.7–10.3)
CO2: 25 mmol/L (ref 18–29)
CREATININE: 0.99 mg/dL (ref 0.57–1.00)
Chloride: 102 mmol/L (ref 97–108)
GFR calc Af Amer: 67 mL/min/{1.73_m2} (ref >59)
GFR calc non Af Amer: 58 mL/min/{1.73_m2} — ABNORMAL LOW (ref >59)
GLUCOSE: 92 mg/dL (ref 65–99)
Globulin, Total: 2.1 g/dL (ref 1.5–4.5)
Potassium: 4.4 mmol/L (ref 3.5–5.2)
Sodium: 142 mmol/L (ref 134–144)
Total Bilirubin: 0.4 mg/dL (ref 0.0–1.2)
Total Protein: 6.2 g/dL (ref 6.0–8.5)

## 2014-07-26 LAB — NMR, LIPOPROFILE
Cholesterol: 155 mg/dL (ref 100–199)
HDL CHOLESTEROL BY NMR: 56 mg/dL (ref >39)
HDL Particle Number: 39.6 umol/L (ref >=30.5)
LDL Particle Number: 1175 nmol/L — ABNORMAL HIGH (ref <1000)
LDL Size: 20.8 nm (ref >20.5)
LDL-C: 74 mg/dL (ref 0–99)
LP-IR Score: 54 — ABNORMAL HIGH (ref <=45)
Small LDL Particle Number: 702 nmol/L — ABNORMAL HIGH (ref <=527)
Triglycerides by NMR: 124 mg/dL (ref 0–149)

## 2014-09-06 ENCOUNTER — Ambulatory Visit
Admission: RE | Admit: 2014-09-06 | Discharge: 2014-09-06 | Disposition: A | Discharge status: Home / Self Care | Payer: Medicare Other | Source: Ambulatory Visit

## 2014-09-06 DIAGNOSIS — Z1231 Encounter for screening mammogram for malignant neoplasm of breast: Secondary | ICD-10-CM

## 2015-01-10 ENCOUNTER — Ambulatory Visit (INDEPENDENT_AMBULATORY_CARE_PROVIDER_SITE_OTHER): Payer: 59 | Admitting: *Deleted

## 2015-01-10 ENCOUNTER — Ambulatory Visit (INDEPENDENT_AMBULATORY_CARE_PROVIDER_SITE_OTHER): Payer: 59

## 2015-01-10 ENCOUNTER — Encounter: Payer: Self-pay | Admitting: *Deleted

## 2015-01-10 VITALS — BP 132/66 | HR 64 | Ht 63.0 in | Wt 219.0 lb

## 2015-01-10 DIAGNOSIS — Z13820 Encounter for screening for osteoporosis: Secondary | ICD-10-CM

## 2015-01-10 DIAGNOSIS — Z0000 Encounter for general adult medical examination without abnormal findings: Secondary | ICD-10-CM

## 2015-01-10 DIAGNOSIS — Z780 Asymptomatic menopausal state: Secondary | ICD-10-CM

## 2015-01-10 DIAGNOSIS — IMO0001 Reserved for inherently not codable concepts without codable children: Secondary | ICD-10-CM

## 2015-01-10 NOTE — Patient Instructions (Signed)
When you come in for your appointment in May, we will check on the price of the zostavax vaccine as we do not have access to this information right now.   If the vaccine is affordable for you, we recommend that you get it.   Preventive Care for Adults A healthy lifestyle and preventive care can promote health and wellness. Preventive health guidelines for women include the following key practices.  A routine yearly physical is a good way to check with your health care provider about your health and preventive screening. It is a chance to share any concerns and updates on your health and to receive a thorough exam.  Visit your dentist for a routine exam and preventive care every 6 months. Brush your teeth twice a day and floss once a day. Good oral hygiene prevents tooth decay and gum disease.  The frequency of eye exams is based on your age, health, family medical history, use of contact lenses, and other factors. Follow your health care provider's recommendations for frequency of eye exams.  Eat a healthy diet. Foods like vegetables, fruits, whole grains, low-fat dairy products, and lean protein foods contain the nutrients you need without too many calories. Decrease your intake of foods high in solid fats, added sugars, and salt. Eat the right amount of calories for you.Get information about a proper diet from your health care provider, if necessary.  Regular physical exercise is one of the most important things you can do for your health. Most adults should get at least 150 minutes of moderate-intensity exercise (any activity that increases your heart rate and causes you to sweat) each week. In addition, most adults need muscle-strengthening exercises on 2 or more days a week.  Maintain a healthy weight. The body mass index (BMI) is a screening tool to identify possible weight problems. It provides an estimate of body fat based on height and weight. Your health care provider can find your BMI and  can help you achieve or maintain a healthy weight.For adults 20 years and older:  A BMI below 18.5 is considered underweight.  A BMI of 18.5 to 24.9 is normal.  A BMI of 25 to 29.9 is considered overweight.  A BMI of 30 and above is considered obese.  Maintain normal blood lipids and cholesterol levels by exercising and minimizing your intake of saturated fat. Eat a balanced diet with plenty of fruit and vegetables. Blood tests for lipids and cholesterol should begin at age 45 and be repeated every 5 years. If your lipid or cholesterol levels are high, you are over 50, or you are at high risk for heart disease, you may need your cholesterol levels checked more frequently.Ongoing high lipid and cholesterol levels should be treated with medicines if diet and exercise are not working.  If you smoke, find out from your health care provider how to quit. If you do not use tobacco, do not start.  Lung cancer screening is recommended for adults aged 76-80 years who are at high risk for developing lung cancer because of a history of smoking. A yearly low-dose CT scan of the lungs is recommended for people who have at least a 30-pack-year history of smoking and are a current smoker or have quit within the past 15 years. A pack year of smoking is smoking an average of 1 pack of cigarettes a day for 1 year (for example: 1 pack a day for 30 years or 2 packs a day for 15 years). Yearly screening  should continue until the smoker has stopped smoking for at least 15 years. Yearly screening should be stopped for people who develop a health problem that would prevent them from having lung cancer treatment.  If you are pregnant, do not drink alcohol. If you are breastfeeding, be very cautious about drinking alcohol. If you are not pregnant and choose to drink alcohol, do not have more than 1 drink per day. One drink is considered to be 12 ounces (355 mL) of beer, 5 ounces (148 mL) of wine, or 1.5 ounces (44 mL) of  liquor.  Avoid use of street drugs. Do not share needles with anyone. Ask for help if you need support or instructions about stopping the use of drugs.  High blood pressure causes heart disease and increases the risk of stroke. Your blood pressure should be checked at least every 1 to 2 years. Ongoing high blood pressure should be treated with medicines if weight loss and exercise do not work.  If you are 78-36 years old, ask your health care provider if you should take aspirin to prevent strokes.  Diabetes screening involves taking a blood sample to check your fasting blood sugar level. This should be done once every 3 years, after age 80, if you are within normal weight and without risk factors for diabetes. Testing should be considered at a younger age or be carried out more frequently if you are overweight and have at least 1 risk factor for diabetes.  Breast cancer screening is essential preventive care for women. You should practice "breast self-awareness." This means understanding the normal appearance and feel of your breasts and may include breast self-examination. Any changes detected, no matter how small, should be reported to a health care provider. Women in their 16s and 30s should have a clinical breast exam (CBE) by a health care provider as part of a regular health exam every 1 to 3 years. After age 15, women should have a CBE every year. Starting at age 28, women should consider having a mammogram (breast X-ray test) every year. Women who have a family history of breast cancer should talk to their health care provider about genetic screening. Women at a high risk of breast cancer should talk to their health care providers about having an MRI and a mammogram every year.  Breast cancer gene (BRCA)-related cancer risk assessment is recommended for women who have family members with BRCA-related cancers. BRCA-related cancers include breast, ovarian, tubal, and peritoneal cancers. Having  family members with these cancers may be associated with an increased risk for harmful changes (mutations) in the breast cancer genes BRCA1 and BRCA2. Results of the assessment will determine the need for genetic counseling and BRCA1 and BRCA2 testing.  Routine pelvic exams to screen for cancer are no longer recommended for nonpregnant women who are considered low risk for cancer of the pelvic organs (ovaries, uterus, and vagina) and who do not have symptoms. Ask your health care provider if a screening pelvic exam is right for you.  If you have had past treatment for cervical cancer or a condition that could lead to cancer, you need Pap tests and screening for cancer for at least 20 years after your treatment. If Pap tests have been discontinued, your risk factors (such as having a new sexual partner) need to be reassessed to determine if screening should be resumed. Some women have medical problems that increase the chance of getting cervical cancer. In these cases, your health care provider may recommend  more frequent screening and Pap tests.  The HPV test is an additional test that may be used for cervical cancer screening. The HPV test looks for the virus that can cause the cell changes on the cervix. The cells collected during the Pap test can be tested for HPV. The HPV test could be used to screen women aged 38 years and older, and should be used in women of any age who have unclear Pap test results. After the age of 16, women should have HPV testing at the same frequency as a Pap test.  Colorectal cancer can be detected and often prevented. Most routine colorectal cancer screening begins at the age of 36 years and continues through age 71 years. However, your health care provider may recommend screening at an earlier age if you have risk factors for colon cancer. On a yearly basis, your health care provider may provide home test kits to check for hidden blood in the stool. Use of a small camera at  the end of a tube, to directly examine the colon (sigmoidoscopy or colonoscopy), can detect the earliest forms of colorectal cancer. Talk to your health care provider about this at age 58, when routine screening begins. Direct exam of the colon should be repeated every 5-10 years through age 60 years, unless early forms of pre-cancerous polyps or small growths are found.  People who are at an increased risk for hepatitis B should be screened for this virus. You are considered at high risk for hepatitis B if:  You were born in a country where hepatitis B occurs often. Talk with your health care provider about which countries are considered high risk.  Your parents were born in a high-risk country and you have not received a shot to protect against hepatitis B (hepatitis B vaccine).  You have HIV or AIDS.  You use needles to inject street drugs.  You live with, or have sex with, someone who has hepatitis B.  You get hemodialysis treatment.  You take certain medicines for conditions like cancer, organ transplantation, and autoimmune conditions.  Hepatitis C blood testing is recommended for all people born from [birth year] through 1967 and any individual with known risks for hepatitis C.  Practice safe sex. Use condoms and avoid high-risk sexual practices to reduce the spread of sexually transmitted infections (STIs). STIs include gonorrhea, chlamydia, syphilis, trichomonas, herpes, HPV, and human immunodeficiency virus (HIV). Herpes, HIV, and HPV are viral illnesses that have no cure. They can result in disability, cancer, and death.  You should be screened for sexually transmitted illnesses (STIs) including gonorrhea and chlamydia if:  You are sexually active and are younger than 24 years.  You are older than 24 years and your health care provider tells you that you are at risk for this type of infection.  Your sexual activity has changed since you were last screened and you are at an increased  risk for chlamydia or gonorrhea. Ask your health care provider if you are at risk.  If you are at risk of being infected with HIV, it is recommended that you take a prescription medicine daily to prevent HIV infection. This is called preexposure prophylaxis (PrEP). You are considered at risk if:  You are a heterosexual woman, are sexually active, and are at increased risk for HIV infection.  You take drugs by injection.  You are sexually active with a partner who has HIV.  Talk with your health care provider about whether you are at high risk  of being infected with HIV. If you choose to begin PrEP, you should first be tested for HIV. You should then be tested every 3 months for as long as you are taking PrEP.  Osteoporosis is a disease in which the bones lose minerals and strength with aging. This can result in serious bone fractures or breaks. The risk of osteoporosis can be identified using a bone density scan. Women ages 15 years and over and women at risk for fractures or osteoporosis should discuss screening with their health care providers. Ask your health care provider whether you should take a calcium supplement or vitamin D to reduce the rate of osteoporosis.  Menopause can be associated with physical symptoms and risks. Hormone replacement therapy is available to decrease symptoms and risks. You should talk to your health care provider about whether hormone replacement therapy is right for you.  Use sunscreen. Apply sunscreen liberally and repeatedly throughout the day. You should seek shade when your shadow is shorter than you. Protect yourself by wearing long sleeves, pants, a wide-brimmed hat, and sunglasses year round, whenever you are outdoors.  Once a month, do a whole body skin exam, using a mirror to look at the skin on your back. Tell your health care provider of new moles, moles that have irregular borders, moles that are larger than a pencil eraser, or moles that have changed  in shape or color.  Stay current with required vaccines (immunizations).  Influenza vaccine. All adults should be immunized every year.  Tetanus, diphtheria, and acellular pertussis (Td, Tdap) vaccine. Pregnant women should receive 1 dose of Tdap vaccine during each pregnancy. The dose should be obtained regardless of the length of time since the last dose. Immunization is preferred during the 27th-36th week of gestation. An adult who has not previously received Tdap or who does not know her vaccine status should receive 1 dose of Tdap. This initial dose should be followed by tetanus and diphtheria toxoids (Td) booster doses every 10 years. Adults with an unknown or incomplete history of completing a 3-dose immunization series with Td-containing vaccines should begin or complete a primary immunization series including a Tdap dose. Adults should receive a Td booster every 10 years.  Varicella vaccine. An adult without evidence of immunity to varicella should receive 2 doses or a second dose if she has previously received 1 dose. Pregnant females who do not have evidence of immunity should receive the first dose after pregnancy. This first dose should be obtained before leaving the health care facility. The second dose should be obtained 4-8 weeks after the first dose.  Human papillomavirus (HPV) vaccine. Females aged 13-26 years who have not received the vaccine previously should obtain the 3-dose series. The vaccine is not recommended for use in pregnant females. However, pregnancy testing is not needed before receiving a dose. If a female is found to be pregnant after receiving a dose, no treatment is needed. In that case, the remaining doses should be delayed until after the pregnancy. Immunization is recommended for any person with an immunocompromised condition through the age of 51 years if she did not get any or all doses earlier. During the 3-dose series, the second dose should be obtained 4-8 weeks  after the first dose. The third dose should be obtained 24 weeks after the first dose and 16 weeks after the second dose.  Zoster vaccine. One dose is recommended for adults aged 58 years or older unless certain conditions are present.  Measles, mumps,  and rubella (MMR) vaccine. Adults born before [birth year] generally are considered immune to measles and mumps. Adults born in [birth year] or later should have 1 or more doses of MMR vaccine unless there is a contraindication to the vaccine or there is laboratory evidence of immunity to each of the three diseases. A routine second dose of MMR vaccine should be obtained at least 28 days after the first dose for students attending postsecondary schools, health care workers, or international travelers. People who received inactivated measles vaccine or an unknown type of measles vaccine during 1963-1967 should receive 2 doses of MMR vaccine. People who received inactivated mumps vaccine or an unknown type of mumps vaccine before 1979 and are at high risk for mumps infection should consider immunization with 2 doses of MMR vaccine. For females of childbearing age, rubella immunity should be determined. If there is no evidence of immunity, females who are not pregnant should be vaccinated. If there is no evidence of immunity, females who are pregnant should delay immunization until after pregnancy. Unvaccinated health care workers born before 50 who lack laboratory evidence of measles, mumps, or rubella immunity or laboratory confirmation of disease should consider measles and mumps immunization with 2 doses of MMR vaccine or rubella immunization with 1 dose of MMR vaccine.  Pneumococcal 13-valent conjugate (PCV13) vaccine. When indicated, a person who is uncertain of her immunization history and has no record of immunization should receive the PCV13 vaccine. An adult aged 18 years or older who has certain medical conditions and has not been previously immunized should receive 1  dose of PCV13 vaccine. This PCV13 should be followed with a dose of pneumococcal polysaccharide (PPSV23) vaccine. The PPSV23 vaccine dose should be obtained at least 8 weeks after the dose of PCV13 vaccine. An adult aged 31 years or older who has certain medical conditions and previously received 1 or more doses of PPSV23 vaccine should receive 1 dose of PCV13. The PCV13 vaccine dose should be obtained 1 or more years after the last PPSV23 vaccine dose.  Pneumococcal polysaccharide (PPSV23) vaccine. When PCV13 is also indicated, PCV13 should be obtained first. All adults aged 54 years and older should be immunized. An adult younger than age 27 years who has certain medical conditions should be immunized. Any person who resides in a nursing home or long-term care facility should be immunized. An adult smoker should be immunized. People with an immunocompromised condition and certain other conditions should receive both PCV13 and PPSV23 vaccines. People with human immunodeficiency virus (HIV) infection should be immunized as soon as possible after diagnosis. Immunization during chemotherapy or radiation therapy should be avoided. Routine use of PPSV23 vaccine is not recommended for American Indians, [place] Natives, or people younger than 65 years unless there are medical conditions that require PPSV23 vaccine. When indicated, people who have unknown immunization and have no record of immunization should receive PPSV23 vaccine. One-time revaccination 5 years after the first dose of PPSV23 is recommended for people aged 19-64 years who have chronic kidney failure, nephrotic syndrome, asplenia, or immunocompromised conditions. People who received 1-2 doses of PPSV23 before age 67 years should receive another dose of PPSV23 vaccine at age 77 years or later if at least 5 years have passed since the previous dose. Doses of PPSV23 are not needed for people immunized with PPSV23 at or after age 19 years.  Meningococcal  vaccine. Adults with asplenia or persistent complement component deficiencies should receive 2 doses of quadrivalent meningococcal conjugate (MenACWY-D) vaccine. The doses  should be obtained at least 2 months apart. Microbiologists working with certain meningococcal bacteria, [employer] recruits, people at risk during an outbreak, and people who travel to or live in countries with a high rate of meningitis should be immunized. A first-year college student up through age 28 years who is living in a residence hall should receive a dose if she did not receive a dose on or after her 16th birthday. Adults who have certain high-risk conditions should receive one or more doses of vaccine.  Hepatitis A vaccine. Adults who wish to be protected from this disease, have certain high-risk conditions, work with hepatitis A-infected animals, work in hepatitis A research labs, or travel to or work in countries with a high rate of hepatitis A should be immunized. Adults who were previously unvaccinated and who anticipate close contact with an international adoptee during the first 60 days after arrival in the [place] from a country with a high rate of hepatitis A should be immunized.  Hepatitis B vaccine. Adults who wish to be protected from this disease, have certain high-risk conditions, may be exposed to blood or other infectious body fluids, are household contacts or sex partners of hepatitis B positive people, are clients or workers in certain care facilities, or travel to or work in countries with a high rate of hepatitis B should be immunized.  Haemophilus influenzae type b (Hib) vaccine. A previously unvaccinated person with asplenia or sickle cell disease or having a scheduled splenectomy should receive 1 dose of Hib vaccine. Regardless of previous immunization, a recipient of a hematopoietic stem cell transplant should receive a 3-dose series 6-12 months after her successful transplant. Hib vaccine is not  recommended for adults with HIV infection. Preventive Services / Frequency Ages 70 to 62 years  Blood pressure check.** / Every 1 to 2 years.  Lipid and cholesterol check.** / Every 5 years beginning at age 82.  Clinical breast exam.** / Every 3 years for women in their 36s and 33s.  BRCA-related cancer risk assessment.** / For women who have family members with a BRCA-related cancer (breast, ovarian, tubal, or peritoneal cancers).  Pap test.** / Every 2 years from ages 30 through 67. Every 3 years starting at age 56 through age 34 or 82 with a history of 3 consecutive normal Pap tests.  HPV screening.** / Every 3 years from ages 25 through ages 10 to 71 with a history of 3 consecutive normal Pap tests.  Hepatitis C blood test.** / For any individual with known risks for hepatitis C.  Skin self-exam. / Monthly.  Influenza vaccine. / Every year.  Tetanus, diphtheria, and acellular pertussis (Tdap, Td) vaccine.** / Consult your health care provider. Pregnant women should receive 1 dose of Tdap vaccine during each pregnancy. 1 dose of Td every 10 years.  Varicella vaccine.** / Consult your health care provider. Pregnant females who do not have evidence of immunity should receive the first dose after pregnancy.  HPV vaccine. / 3 doses over 6 months, if 34 and younger. The vaccine is not recommended for use in pregnant females. However, pregnancy testing is not needed before receiving a dose.  Measles, mumps, rubella (MMR) vaccine.** / You need at least 1 dose of MMR if you were born in 1957 or later. You may also need a 2nd dose. For females of childbearing age, rubella immunity should be determined. If there is no evidence of immunity, females who are not pregnant should be vaccinated. If there is no evidence  of immunity, females who are pregnant should delay immunization until after pregnancy.  Pneumococcal 13-valent conjugate (PCV13) vaccine.** / Consult your health care  provider.  Pneumococcal polysaccharide (PPSV23) vaccine.** / 1 to 2 doses if you smoke cigarettes or if you have certain conditions.  Meningococcal vaccine.** / 1 dose if you are age 7 to 72 years and a Market researcher living in a residence hall, or have one of several medical conditions, you need to get vaccinated against meningococcal disease. You may also need additional booster doses.  Hepatitis A vaccine.** / Consult your health care provider.  Hepatitis B vaccine.** / Consult your health care provider.  Haemophilus influenzae type b (Hib) vaccine.** / Consult your health care provider. Ages 29 to 52 years  Blood pressure check.** / Every 1 to 2 years.  Lipid and cholesterol check.** / Every 5 years beginning at age 68 years.  Lung cancer screening. / Every year if you are aged 19-80 years and have a 30-pack-year history of smoking and currently smoke or have quit within the past 15 years. Yearly screening is stopped once you have quit smoking for at least 15 years or develop a health problem that would prevent you from having lung cancer treatment.  Clinical breast exam.** / Every year after age 10 years.  BRCA-related cancer risk assessment.** / For women who have family members with a BRCA-related cancer (breast, ovarian, tubal, or peritoneal cancers).  Mammogram.** / Every year beginning at age 68 years and continuing for as long as you are in good health. Consult with your health care provider.  Pap test.** / Every 3 years starting at age 63 years through age 60 or 65 years with a history of 3 consecutive normal Pap tests.  HPV screening.** / Every 3 years from ages 10 years through ages 27 to 34 years with a history of 3 consecutive normal Pap tests.  Fecal occult blood test (FOBT) of stool. / Every year beginning at age 48 years and continuing until age 54 years. You may not need to do this test if you get a colonoscopy every 10 years.  Flexible sigmoidoscopy  or colonoscopy.** / Every 5 years for a flexible sigmoidoscopy or every 10 years for a colonoscopy beginning at age 54 years and continuing until age 38 years.  Hepatitis C blood test.** / For all people born from [birth year] through 1967 and any individual with known risks for hepatitis C.  Skin self-exam. / Monthly.  Influenza vaccine. / Every year.  Tetanus, diphtheria, and acellular pertussis (Tdap/Td) vaccine.** / Consult your health care provider. Pregnant women should receive 1 dose of Tdap vaccine during each pregnancy. 1 dose of Td every 10 years.  Varicella vaccine.** / Consult your health care provider. Pregnant females who do not have evidence of immunity should receive the first dose after pregnancy.  Zoster vaccine.** / 1 dose for adults aged 71 years or older.  Measles, mumps, rubella (MMR) vaccine.** / You need at least 1 dose of MMR if you were born in 1957 or later. You may also need a 2nd dose. For females of childbearing age, rubella immunity should be determined. If there is no evidence of immunity, females who are not pregnant should be vaccinated. If there is no evidence of immunity, females who are pregnant should delay immunization until after pregnancy.  Pneumococcal 13-valent conjugate (PCV13) vaccine.** / Consult your health care provider.  Pneumococcal polysaccharide (PPSV23) vaccine.** / 1 to 2 doses if you smoke cigarettes or if  you have certain conditions.  Meningococcal vaccine.** / Consult your health care provider.  Hepatitis A vaccine.** / Consult your health care provider.  Hepatitis B vaccine.** / Consult your health care provider.  Haemophilus influenzae type b (Hib) vaccine.** / Consult your health care provider. Ages 23 years and over  Blood pressure check.** / Every 1 to 2 years.  Lipid and cholesterol check.** / Every 5 years beginning at age 41 years.  Lung cancer screening. / Every year if you are aged 62-80 years and have a 30-pack-year history  of smoking and currently smoke or have quit within the past 15 years. Yearly screening is stopped once you have quit smoking for at least 15 years or develop a health problem that would prevent you from having lung cancer treatment.  Clinical breast exam.** / Every year after age 34 years.  BRCA-related cancer risk assessment.** / For women who have family members with a BRCA-related cancer (breast, ovarian, tubal, or peritoneal cancers).  Mammogram.** / Every year beginning at age 73 years and continuing for as long as you are in good health. Consult with your health care provider.  Pap test.** / Every 3 years starting at age 24 years through age 72 or 65 years with 3 consecutive normal Pap tests. Testing can be stopped between 65 and 70 years with 3 consecutive normal Pap tests and no abnormal Pap or HPV tests in the past 10 years.  HPV screening.** / Every 3 years from ages 45 years through ages 2 or 72 years with a history of 3 consecutive normal Pap tests. Testing can be stopped between 65 and 70 years with 3 consecutive normal Pap tests and no abnormal Pap or HPV tests in the past 10 years.  Fecal occult blood test (FOBT) of stool. / Every year beginning at age 85 years and continuing until age 45 years. You may not need to do this test if you get a colonoscopy every 10 years.  Flexible sigmoidoscopy or colonoscopy.** / Every 5 years for a flexible sigmoidoscopy or every 10 years for a colonoscopy beginning at age 39 years and continuing until age 71 years.  Hepatitis C blood test.** / For all people born from 40 through 1965 and any individual with known risks for hepatitis C.  Osteoporosis screening.** / A one-time screening for women ages 104 years and over and women at risk for fractures or osteoporosis.  Skin self-exam. / Monthly.  Influenza vaccine. / Every year.  Tetanus, diphtheria, and acellular pertussis (Tdap/Td) vaccine.** / 1 dose of Td every 10 years.  Varicella  vaccine.** / Consult your health care provider.  Zoster vaccine.** / 1 dose for adults aged 59 years or older.  Pneumococcal 13-valent conjugate (PCV13) vaccine.** / Consult your health care provider.  Pneumococcal polysaccharide (PPSV23) vaccine.** / 1 dose for all adults aged 58 years and older.  Meningococcal vaccine.** / Consult your health care provider.  Hepatitis A vaccine.** / Consult your health care provider.  Hepatitis B vaccine.** / Consult your health care provider.  Haemophilus influenzae type b (Hib) vaccine.** / Consult your health care provider. ** Family history and personal history of risk and conditions may change your health care provider's recommendations. Document Released: 11/05/2001 Document Revised: 01/24/2014 Document Reviewed: 02/04/2011 [facility] Patient Information 2015 [place], [place]. This information is not intended to replace advice given to you by your health care provider. Make sure you discuss any questions you have with your health care provider.

## 2015-01-10 NOTE — Progress Notes (Signed)
Subjective:   Sarah Benton is a 69 y.o. female who presents for an Initial Medicare Annual Wellness Visit.  She is doing well today, with no complaints.  She states she has been having leg cramping more recently- was seen at the urgent care for back pain and leg pain- recently where she was given a muscle relaxer.  The medication did not significantly help her leg cramping.  She has an appointment with Chevis Pretty, FNP in May where she plans to discuss the leg cramping.  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  She spends time taking care of her husband who has vascular dementia and lives at [facility], and helps take care of and provide transportation for her 60 year old grand daughter with special need        Objective:    Today's Vitals   01/10/15 1123  BP: 132/66  Pulse: 64  Height: 5\' 3"  (1.6 m)  Weight: 219 lb (99.338 kg)  PainSc: 0-No pain    Current Medications (verified) Outpatient Encounter Prescriptions as of 01/10/2015  Medication Sig  . Cholecalciferol (VITAMIN D3) 2000 UNITS capsule Take 2,000 Units by mouth daily.    Marland Kitchen HYDROcodone-acetaminophen (NORCO/VICODIN) 5-325 MG per tablet Take 1 tablet by mouth every 6 (six) hours as needed.  Marland Kitchen lisinopril-hydrochlorothiazide (PRINZIDE,ZESTORETIC) 20-12.5 MG per tablet Take 1 tablet by mouth daily.  . meloxicam (MOBIC) 15 MG tablet Take 1 tablet (15 mg total) by mouth daily.  Marland Kitchen omeprazole (PRILOSEC) 20 MG capsule Take 1 capsule (20 mg total) by mouth daily.  . rosuvastatin (CRESTOR) 20 MG tablet Take 1 tablet (20 mg total) by mouth daily.  . [DISCONTINUED] amitriptyline (ELAVIL) 25 MG tablet Take 1 tablet (25 mg total) by mouth at bedtime.    Allergies (verified) Septra   History: Past Medical History  Diagnosis Date  . GERD (gastroesophageal reflux disease)   . Esophageal stricture   . Hypertension   . Hyperlipidemia   . Edema   . Ruptured lumbar disc    . Arthritis   . Obesity   . Anemia     Iron def   Past Surgical History  Procedure Laterality Date  . Lumbar laminectomy  1998  . Breast biopsy  1993    Left  . Abdominal hysterectomy  2001  . Tubal ligation  1979  . Back surgery  1998    ruptured disc   Family History  Problem Relation Age of Onset  . Parkinsonism Mother 33  . Ovarian cancer Mother   . Prostate cancer Father 52  . Colon cancer Neg Hx    Social History   Occupational History  . Retired     Conservation officer, historic buildings   Social History Main Topics  . Smoking status: Never Smoker   . Smokeless tobacco: Never Used  . Alcohol Use: No  . Drug Use: No  . Sexual Activity: No    Tobacco Counseling Counseling given: Not Answered   Activities of Daily Living In your present state of health, do you have any difficulty performing the following activities: 01/10/2015 07/25/2014  Hearing? N N  Vision? N N  Difficulty concentrating or making decisions? N N  Walking or climbing stairs? N N  Dressing or bathing? N N  Doing errands, shopping? N N  Preparing Food and eating ? N -  Using the Toilet? N -  In the past six months, have you accidently leaked urine? Y -  Do you have problems with loss of bowel control? N -  Managing your Medications? N -  Managing your Finances? N -  Housekeeping or managing your Housekeeping? N -    Immunizations and Health Maintenance Immunization History  Administered Date(s) Administered  . Influenza,inj,Quad PF,36+ Mos 07/25/2014  . Pneumococcal Conjugate-13 07/25/2014  . Pneumococcal Polysaccharide-23 06/07/2011  . Tdap 06/07/2011   Health Maintenance Due  Topic Date Due  . DEXA SCAN  01/01/2009    Patient Care Team: Chevis Pretty, FNP as PCP - General (Nurse Practitioner)  Indicate any recent Medical Services you may have received from other than [facility] providers in the past year (date may be approximate).     Assessment:   This is a routine wellness examination for  Maynard.  Hearing/Vision screen Up to date- seen by [facility] in [place] 12/13/14 No hearing deficits noted during visit  Dietary issues and exercise activities discussed: Current Exercise Habits:: The patient does not participate in regular exercise at present  Goals    None    Try to increase physical activity to include more walking   Depression Screen PHQ 2/9 Scores 01/10/2015 07/25/2014 01/07/2014  PHQ - 2 Score 0 1 0    Fall Risk Fall Risk  01/10/2015 07/25/2014 01/07/2014  Falls in the past year? No No No    Cognitive Function: MMSE - Mini Mental State Exam 01/10/2015  Orientation to time 5  Orientation to Place 5  Registration 3  Attention/ Calculation 5  Recall 3  Language- name 2 objects 2  Language- repeat 1  Language- follow 3 step command 3  Language- read & follow direction 1  Write a sentence 1  Copy design 1  Total score 30    Screening Tests Health Maintenance  Topic Date Due  . DEXA SCAN  01/01/2009  . ZOSTAVAX  01/23/2015 (Originally 01/02/2004)  . INFLUENZA VACCINE  04/24/2015  . PNA vac Low Risk Adult (2 of 2 - PPSV23) 07/26/2015  . MAMMOGRAM  09/06/2016  . TETANUS/TDAP  05/27/2021  . COLONOSCOPY  06/09/2022  Dexa scan done today     Plan:     During the course of the visit, Cliffie was educated and counseled about the following appropriate screening and preventive services:   Vaccines to include Pneumoccal, Influenza, Td, Zostavax up to date on all but zostavax.  Patient would like to check on cost and get at her appointment with Chevis Pretty, FNP in May if affordable  Colorectal cancer screening- up to date  Bone density screening- done today- normal  Diabetes screening- up to date  Glaucoma - up to date- done 12/13/14 at [facility] in [place]    Mammography/PAP- up to date  Nutrition counseling  Smoking cessation counseling- never smoker  Patient Instructions (the written plan) were  given to the patient.    Roxene Alviar M, RN   01/10/2015      I have reviewed and agree with the above AWV documentation.  Claretta Fraise, M.D.

## 2015-01-23 ENCOUNTER — Telehealth: Payer: Self-pay | Admitting: Nurse Practitioner

## 2015-01-23 ENCOUNTER — Encounter: Payer: Self-pay | Admitting: Nurse Practitioner

## 2015-01-23 ENCOUNTER — Ambulatory Visit (INDEPENDENT_AMBULATORY_CARE_PROVIDER_SITE_OTHER): Payer: 59 | Admitting: Nurse Practitioner

## 2015-01-23 VITALS — BP 121/70 | HR 71 | Temp 98.0°F | Ht 63.0 in | Wt 218.0 lb

## 2015-01-23 DIAGNOSIS — R197 Diarrhea, unspecified: Secondary | ICD-10-CM

## 2015-01-23 LAB — POCT CBC
GRANULOCYTE PERCENT: 79.2 % (ref 37–80)
HCT, POC: 40.7 % (ref 37.7–47.9)
HEMOGLOBIN: 12.7 g/dL (ref 12.2–16.2)
LYMPH, POC: 1.5 (ref 0.6–3.4)
MCH: 26.4 pg — AB (ref 27–31.2)
MCHC: 31.3 g/dL — AB (ref 31.8–35.4)
MCV: 84.3 fL (ref 80–97)
MPV: 7.7 fL (ref 0–99.8)
POC GRANULOCYTE: 8.6 — AB (ref 2–6.9)
POC LYMPH %: 14.2 % (ref 10–50)
Platelet Count, POC: 292 10*3/uL (ref 142–424)
RBC: 4.82 M/uL (ref 4.04–5.48)
RDW, POC: 14.3 %
WBC: 10.9 10*3/uL — AB (ref 4.6–10.2)

## 2015-01-23 MED ORDER — CIPROFLOXACIN HCL 500 MG PO TABS: 500.0000 mg | ORAL_TABLET | Freq: Two times a day (BID) | ORAL | Status: DC

## 2015-01-23 NOTE — Telephone Encounter (Signed)
Appt scheduled for 1:15 today with Sarah Benton. Patient aware.

## 2015-01-23 NOTE — Progress Notes (Signed)
   Subjective:    Patient ID: Sarah Benton, female    DOB: Jan 16, 1946, 69 y.o.   MRN: [id number]  HPI Patient in c/o of "stomach bug"- Started last Wednesday with diarrhea chills and stomach pain- resolved by Thursday- Started back again Saturday with just diarrhea. Started vomited this morning but only vomited one time. Has kept foods and liquids down since vomiting episode this morning. Took imodium AD this morning which stopped the diarrhea.    Review of Systems  Constitutional: Negative.   HENT: Negative.   Respiratory: Negative.   Cardiovascular: Negative.   Gastrointestinal: Negative.   Genitourinary: Negative.   Neurological: Negative.   Psychiatric/Behavioral: Negative.   All other systems reviewed and are negative.      Objective:   Physical Exam  Constitutional: She appears well-developed and well-nourished.  Cardiovascular: Normal rate, regular rhythm and normal heart sounds.   Pulmonary/Chest: Effort normal and breath sounds normal.  Abdominal: Soft. Bowel sounds are normal. She exhibits no distension and no mass. There is no tenderness. There is no rebound and no guarding.  Neurological: She is alert.  Skin: Skin is warm and dry.  Psychiatric: She has a normal mood and affect. Her behavior is normal. Judgment and thought content normal.    BP 121/70 mmHg  Pulse 71  Temp(Src) 98 F (36.7 C) (Oral)  Ht 5\' 3"  (1.6 m)  Wt 218 lb (98.884 kg)  BMI 38.63 kg/m2  Results for orders placed or performed in visit on 01/23/15  POCT CBC  Result Value Ref Range   WBC 10.9 (A) 4.6 - 10.2 K/uL   Lymph, poc 1.5 0.6 - 3.4   POC LYMPH PERCENT 14.2 10 - 50 %L   POC Granulocyte 8.6 (A) 2 - 6.9   Granulocyte percent 79.2 37 - 80 %G   RBC 4.82 4.04 - 5.48 M/uL   Hemoglobin 12.7 12.2 - 16.2 g/dL   HCT, POC 40.7 37.7 - 47.9 %   MCV 84.3 80 - 97 fL   MCH, POC 26.4 (A) 27 - 31.2 pg   MCHC 31.3 (A) 31.8 - 35.4 g/dL   RDW, POC 14.3 %   Platelet Count, POC 292 142 - 424 K/uL     MPV 7.7 0 - 99.8 fL        Assessment & Plan:  1. Diarrhea Stool specimen containers given to patient- rto prn Force fluids - POCT CBC - ciprofloxacin (CIPRO) 500 MG tablet; Take 1 tablet (500 mg total) by mouth 2 (two) times daily.  Dispense: 20 tablet; Refill: 0  [name], FNP

## 2015-01-23 NOTE — Patient Instructions (Signed)

## 2015-01-25 ENCOUNTER — Ambulatory Visit (INDEPENDENT_AMBULATORY_CARE_PROVIDER_SITE_OTHER): Payer: 59 | Admitting: Nurse Practitioner

## 2015-01-25 ENCOUNTER — Encounter: Payer: Self-pay | Admitting: Nurse Practitioner

## 2015-01-25 VITALS — BP 122/65 | HR 55 | Temp 97.8°F | Ht 63.0 in | Wt 220.0 lb

## 2015-01-25 DIAGNOSIS — R609 Edema, unspecified: Secondary | ICD-10-CM | POA: Diagnosis not present

## 2015-01-25 DIAGNOSIS — K219 Gastro-esophageal reflux disease without esophagitis: Secondary | ICD-10-CM

## 2015-01-25 DIAGNOSIS — D649 Anemia, unspecified: Secondary | ICD-10-CM | POA: Diagnosis not present

## 2015-01-25 DIAGNOSIS — E782 Mixed hyperlipidemia: Secondary | ICD-10-CM

## 2015-01-25 DIAGNOSIS — M545 Low back pain: Secondary | ICD-10-CM

## 2015-01-25 DIAGNOSIS — E663 Overweight: Secondary | ICD-10-CM | POA: Diagnosis not present

## 2015-01-25 DIAGNOSIS — M546 Pain in thoracic spine: Secondary | ICD-10-CM

## 2015-01-25 DIAGNOSIS — I10 Essential (primary) hypertension: Secondary | ICD-10-CM | POA: Diagnosis not present

## 2015-01-25 DIAGNOSIS — Z23 Encounter for immunization: Secondary | ICD-10-CM

## 2015-01-25 DIAGNOSIS — M549 Dorsalgia, unspecified: Secondary | ICD-10-CM | POA: Diagnosis not present

## 2015-01-25 MED ORDER — ROSUVASTATIN CALCIUM 20 MG PO TABS: 20.0000 mg | ORAL_TABLET | Freq: Every day | ORAL | Status: DC

## 2015-01-25 MED ORDER — LISINOPRIL-HYDROCHLOROTHIAZIDE 20-12.5 MG PO TABS: 1.0000 | ORAL_TABLET | Freq: Every day | ORAL | Status: DC

## 2015-01-25 MED ORDER — OMEPRAZOLE 20 MG PO CPDR: 20.0000 mg | DELAYED_RELEASE_CAPSULE | Freq: Every day | ORAL | Status: DC

## 2015-01-25 MED ORDER — MELOXICAM 15 MG PO TABS: 15.0000 mg | ORAL_TABLET | Freq: Every day | ORAL | Status: DC

## 2015-01-25 NOTE — Addendum Note (Signed)
Addended by: Rolena Infante on: 01/25/2015 02:54 PM   Modules accepted: Orders

## 2015-01-25 NOTE — Patient Instructions (Signed)
Fat and Cholesterol Control Diet Fat and cholesterol levels in your blood and organs are influenced by your diet. High levels of fat and cholesterol may lead to diseases of the heart, small and large blood vessels, gallbladder, liver, and pancreas. CONTROLLING FAT AND CHOLESTEROL WITH DIET Although exercise and lifestyle factors are important, your diet is key. That is because certain foods are known to raise cholesterol and others to lower it. The goal is to balance foods for their effect on cholesterol and more importantly, to replace saturated and trans fat with other types of fat, such as monounsaturated fat, polyunsaturated fat, and omega-3 fatty acids. On average, a person should consume no more than 15 to 17 g of saturated fat daily. Saturated and trans fats are considered "bad" fats, and they will raise LDL cholesterol. Saturated fats are primarily found in animal products such as meats, butter, and cream. However, that does not mean you need to give up all your favorite foods. Today, there are good tasting, low-fat, low-cholesterol substitutes for most of the things you like to eat. Choose low-fat or nonfat alternatives. Choose round or loin cuts of red meat. These types of cuts are lowest in fat and cholesterol. Chicken (without the skin), fish, veal, and ground turkey breast are great choices. Eliminate fatty meats, such as hot dogs and salami. Even shellfish have little or no saturated fat. Have a 3 oz (85 g) portion when you eat lean meat, poultry, or fish. Trans fats are also called "partially hydrogenated oils." They are oils that have been scientifically manipulated so that they are solid at room temperature resulting in a longer shelf life and improved taste and texture of foods in which they are added. Trans fats are found in stick margarine, some tub margarines, cookies, crackers, and baked goods.  When baking and cooking, oils are a great substitute for butter. The monounsaturated oils are  especially beneficial since it is believed they lower LDL and raise HDL. The oils you should avoid entirely are saturated tropical oils, such as coconut and palm.  Remember to eat a lot from food groups that are naturally free of saturated and trans fat, including fish, fruit, vegetables, beans, grains (barley, rice, couscous, bulgur wheat), and pasta (without cream sauces).  IDENTIFYING FOODS THAT LOWER FAT AND CHOLESTEROL  Soluble fiber may lower your cholesterol. This type of fiber is found in fruits such as apples, vegetables such as broccoli, potatoes, and carrots, legumes such as beans, peas, and lentils, and grains such as barley. Foods fortified with plant sterols (phytosterol) may also lower cholesterol. You should eat at least 2 g per day of these foods for a cholesterol lowering effect.  Read package labels to identify low-saturated fats, trans fat free, and low-fat foods at the supermarket. Select cheeses that have only 2 to 3 g saturated fat per ounce. Use a heart-healthy tub margarine that is free of trans fats or partially hydrogenated oil. When buying baked goods (cookies, crackers), avoid partially hydrogenated oils. Breads and muffins should be made from whole grains (whole-wheat or whole oat flour, instead of "flour" or "enriched flour"). Buy non-creamy canned soups with reduced salt and no added fats.  FOOD PREPARATION TECHNIQUES  Never deep-fry. If you must fry, either stir-fry, which uses very little fat, or use non-stick cooking sprays. When possible, broil, bake, or roast meats, and steam vegetables. Instead of putting butter or margarine on vegetables, use lemon and herbs, applesauce, and cinnamon (for squash and sweet potatoes). Use nonfat   yogurt, salsa, and low-fat dressings for salads.  LOW-SATURATED FAT / LOW-FAT FOOD SUBSTITUTES Meats / Saturated Fat (g)  Avoid: Steak, marbled (3 oz/85 g) / 11 g  Choose: Steak, lean (3 oz/85 g) / 4 g  Avoid: Hamburger (3 oz/85 g) / 7  g  Choose: Hamburger, lean (3 oz/85 g) / 5 g  Avoid: Ham (3 oz/85 g) / 6 g  Choose: Ham, lean cut (3 oz/85 g) / 2.4 g  Avoid: Chicken, with skin, dark meat (3 oz/85 g) / 4 g  Choose: Chicken, skin removed, dark meat (3 oz/85 g) / 2 g  Avoid: Chicken, with skin, light meat (3 oz/85 g) / 2.5 g  Choose: Chicken, skin removed, light meat (3 oz/85 g) / 1 g Dairy / Saturated Fat (g)  Avoid: Whole milk (1 cup) / 5 g  Choose: Low-fat milk, 2% (1 cup) / 3 g  Choose: Low-fat milk, 1% (1 cup) / 1.5 g  Choose: Skim milk (1 cup) / 0.3 g  Avoid: Hard cheese (1 oz/28 g) / 6 g  Choose: Skim milk cheese (1 oz/28 g) / 2 to 3 g  Avoid: Cottage cheese, 4% fat (1 cup) / 6.5 g  Choose: Low-fat cottage cheese, 1% fat (1 cup) / 1.5 g  Avoid: Ice cream (1 cup) / 9 g  Choose: Sherbet (1 cup) / 2.5 g  Choose: Nonfat frozen yogurt (1 cup) / 0.3 g  Choose: Frozen fruit bar / trace  Avoid: Whipped cream (1 tbs) / 3.5 g  Choose: Nondairy whipped topping (1 tbs) / 1 g Condiments / Saturated Fat (g)  Avoid: Mayonnaise (1 tbs) / 2 g  Choose: Low-fat mayonnaise (1 tbs) / 1 g  Avoid: Butter (1 tbs) / 7 g  Choose: Extra light margarine (1 tbs) / 1 g  Avoid: Coconut oil (1 tbs) / 11.8 g  Choose: Olive oil (1 tbs) / 1.8 g  Choose: Corn oil (1 tbs) / 1.7 g  Choose: Safflower oil (1 tbs) / 1.2 g  Choose: Sunflower oil (1 tbs) / 1.4 g  Choose: Soybean oil (1 tbs) / 2.4 g  Choose: Canola oil (1 tbs) / 1 g Document Released: 09/09/2005 Document Revised: 01/04/2013 Document Reviewed: 12/08/2013 [employer] Patient Information 2015 [employer]. This information is not intended to replace advice given to you by your health care provider. Make sure you discuss any questions you have with your health care provider.  

## 2015-01-25 NOTE — Progress Notes (Addendum)
Subjective:    Patient ID: Sarah Benton, female    DOB: 1946-09-21, 69 y.o.   MRN: [id number]   Patient here today for follow up of chronic medical problems. Had diarrhea last week and was seen and given cipro but has not had BM since last week- May be slightly constipated now but says that she feels better   Hypertension This is a chronic problem. The current episode started more than 1 year ago. The problem is controlled. Pertinent negatives include no chest pain, headaches, neck pain, palpitations or shortness of breath. Risk factors for coronary artery disease include sedentary lifestyle, dyslipidemia, obesity and post-menopausal state. Past treatments include ACE inhibitors and diuretics. The current treatment provides significant improvement. Compliance problems include diet and exercise.  There is no history of CAD/MI.  Hyperlipidemia This is a chronic problem. The current episode started more than 1 year ago. Recent lipid tests were reviewed and are variable. Factors aggravating her hyperlipidemia include thiazides. Pertinent negatives include no chest pain or shortness of breath. Current antihyperlipidemic treatment includes statins. The current treatment provides moderate improvement of lipids. Compliance problems include adherence to diet and adherence to exercise.  Risk factors for coronary artery disease include dyslipidemia, hypertension, obesity and post-menopausal.  GERD Omeprazole works well to kep symptoms under control anemia Currently on no supplements Thoracic and lumbar back pain Takes mobic which helps with discomfort Peripheral edema/ NO CHF Takes HCTZ in her blood pressure med- edema only occurs occasionally.  Review of Systems  Constitutional: Negative.   HENT: Negative.   Respiratory: Negative for shortness of breath.   Cardiovascular: Negative for chest pain and palpitations.  Genitourinary: Negative.   Musculoskeletal: Negative for neck pain.   Neurological: Negative for headaches.  Psychiatric/Behavioral: Negative.   All other systems reviewed and are negative.      Objective:   Physical Exam  Constitutional: She is oriented to person, place, and time. She appears well-developed and well-nourished.  HENT:  Nose: Nose normal.  Mouth/Throat: Oropharynx is clear and moist.  Eyes: EOM are normal.  Neck: Trachea normal, normal range of motion and full passive range of motion without pain. Neck supple. No JVD present. Carotid bruit is not present. No thyromegaly present.  Cardiovascular: Normal rate, regular rhythm, normal heart sounds and intact distal pulses.  Exam reveals no gallop and no friction rub.   No murmur heard. Pulmonary/Chest: Effort normal and breath sounds normal.  Abdominal: Soft. Bowel sounds are normal. She exhibits no distension and no mass. There is no tenderness.  Musculoskeletal: Normal range of motion.  Lymphadenopathy:    She has no cervical adenopathy.  Neurological: She is alert and oriented to person, place, and time. She has normal reflexes.  Skin: Skin is warm and dry.  Psychiatric: She has a normal mood and affect. Her behavior is normal. Judgment and thought content normal.    BP 122/65 mmHg  Pulse 55  Temp(Src) 97.8 F (36.6 C) (Oral)  Ht '5\' 3"'  (1.6 m)  Wt 220 lb (99.791 kg)  BMI 38.98 kg/m2       Assessment & Plan:  1. Gastroesophageal reflux disease without esophagitis Avoid spicy foods Do not eat 2 hours prior to bedtime  2. Essential hypertension, benign Do not add slat to diet - CMP14+EGFR - lisinopril-hydrochlorothiazide (PRINZIDE,ZESTORETIC) 20-12.5 MG per tablet; Take 1 tablet by mouth daily.  Dispense: 30 tablet; Refill: 5  3. Overweight Discussed diet and exercise for person with BMI >25 Will recheck weight in 3-6 months  4. Normocytic anemia  5. Mixed hyperlipidemia Low fat diet - NMR, lipoprofile - rosuvastatin (CRESTOR) 20 MG tablet; Take 1 tablet (20 mg  total) by mouth daily.  Dispense: 30 tablet; Refill: 5  6. Edema Elevate legs when sitting  7. Acute thoracic back pain  8. Gastroesophageal reflux disease, esophagitis presence not specified - omeprazole (PRILOSEC) 20 MG capsule; Take 1 capsule (20 mg total) by mouth daily.  Dispense: 30 capsule; Refill: 5  9. Low back pain, unspecified back pain laterality, with sciatica presence unspecified - meloxicam (MOBIC) 15 MG tablet; Take 1 tablet (15 mg total) by mouth daily.  Dispense: 30 tablet; Refill: 5   zostavax today Labs pending Health maintenance reviewed Diet and exercise encouraged Continue all meds Follow up  In 3 month   [place], FNP

## 2015-01-26 LAB — CMP14+EGFR
A/G RATIO: 1.9 (ref 1.1–2.5)
ALBUMIN: 4.1 g/dL (ref 3.5–4.8)
ALT: 12 IU/L (ref 0–32)
AST: 15 IU/L (ref 0–40)
Alkaline Phosphatase: 114 IU/L (ref 39–117)
BILIRUBIN TOTAL: 0.4 mg/dL (ref 0.0–1.2)
BUN / CREAT RATIO: 21 (ref 11–26)
BUN: 21 mg/dL (ref 8–27)
CO2: 25 mmol/L (ref 18–29)
CREATININE: 1.01 mg/dL — AB (ref 0.57–1.00)
Calcium: 9.7 mg/dL (ref 8.7–10.3)
Chloride: 103 mmol/L (ref 97–108)
GFR, EST AFRICAN AMERICAN: 65 mL/min/{1.73_m2} (ref >59)
GFR, EST NON AFRICAN AMERICAN: 56 mL/min/{1.73_m2} — AB (ref >59)
GLOBULIN, TOTAL: 2.2 g/dL (ref 1.5–4.5)
Glucose: 99 mg/dL (ref 65–99)
Potassium: 4.7 mmol/L (ref 3.5–5.2)
Sodium: 143 mmol/L (ref 134–144)
Total Protein: 6.3 g/dL (ref 6.0–8.5)

## 2015-01-26 LAB — NMR, LIPOPROFILE
Cholesterol: 160 mg/dL (ref 100–199)
HDL CHOLESTEROL BY NMR: 55 mg/dL (ref >39)
HDL PARTICLE NUMBER: 41.4 umol/L (ref >=30.5)
LDL Particle Number: 1124 nmol/L — ABNORMAL HIGH (ref <1000)
LDL Size: 20.9 nm (ref >20.5)
LDL-C: 82 mg/dL (ref 0–99)
LP-IR Score: 51 — ABNORMAL HIGH (ref <=45)
SMALL LDL PARTICLE NUMBER: 482 nmol/L (ref <=527)
Triglycerides by NMR: 117 mg/dL (ref 0–149)

## 2015-02-10 ENCOUNTER — Other Ambulatory Visit: Payer: Self-pay | Admitting: *Deleted

## 2015-02-10 DIAGNOSIS — E782 Mixed hyperlipidemia: Secondary | ICD-10-CM

## 2015-02-10 DIAGNOSIS — I10 Essential (primary) hypertension: Secondary | ICD-10-CM

## 2015-02-10 MED ORDER — ROSUVASTATIN CALCIUM 20 MG PO TABS: 20.0000 mg | ORAL_TABLET | Freq: Every day | ORAL | Status: DC

## 2015-02-10 MED ORDER — LISINOPRIL-HYDROCHLOROTHIAZIDE 20-12.5 MG PO TABS
1.0000 | ORAL_TABLET | Freq: Every day | ORAL | Status: DC
Start: 2015-02-10 — End: 2015-07-28

## 2015-03-13 ENCOUNTER — Encounter: Payer: Self-pay | Admitting: Family Medicine

## 2015-03-13 ENCOUNTER — Ambulatory Visit (INDEPENDENT_AMBULATORY_CARE_PROVIDER_SITE_OTHER): Payer: Medicare Other | Admitting: Family Medicine

## 2015-03-13 VITALS — BP 109/59 | HR 77 | Temp 98.6°F | Ht 63.0 in | Wt 224.0 lb

## 2015-03-13 DIAGNOSIS — J301 Allergic rhinitis due to pollen: Secondary | ICD-10-CM | POA: Diagnosis not present

## 2015-03-13 DIAGNOSIS — J322 Chronic ethmoidal sinusitis: Secondary | ICD-10-CM

## 2015-03-13 MED ORDER — AMOXICILLIN 500 MG PO CAPS: 500.0000 mg | ORAL_CAPSULE | Freq: Three times a day (TID) | ORAL | Status: DC

## 2015-03-13 MED ORDER — FLUTICASONE PROPIONATE 50 MCG/ACT NA SUSP: 2.0000 | Freq: Every day | NASAL | Status: DC

## 2015-03-13 NOTE — Patient Instructions (Addendum)
Use nasal saline in each nostril 3 or 4 times daily and use Flonase 1-2 sprays each nostril at bedtime Take Mucinex, maximum strength, blue and white in color, 1 twice daily with a large glass of water for cough and congestion Continue to use Aleve twice daily after breakfast and supper to help the soreness in the posterior neck Take antibiotic as directed until completed Use warm wet compresses posterior neck 20 minutes 3 or 4 times daily

## 2015-03-13 NOTE — Progress Notes (Signed)
Subjective:    Patient ID: Sarah Benton, female    DOB: [birth year], 69 y.o.   MRN: [id number]  HPI Patient here today for sinus trouble that started about 2 weeks ago. The patient has nasal congestion and drainage and pressure. She has had some right occipital pain that radiated down the neck. She complains of some fullness in the sinuses and some drainage down her throat and she has not seen the color of this. She has not had any fever.      Patient Active Problem List   Diagnosis Date Noted  . Acute thoracic back pain 06/12/2013  . Normocytic anemia 04/06/2012  . GERD 11/08/2010  . MIXED HYPERLIPIDEMIA 12/12/2008  . Overweight 12/12/2008  . Essential hypertension, benign 12/12/2008  . EDEMA 12/12/2008   Outpatient Encounter Prescriptions as of 03/13/2015  Medication Sig  . Cholecalciferol (VITAMIN D3) 2000 UNITS capsule Take 2,000 Units by mouth daily.    Marland Kitchen lisinopril-hydrochlorothiazide (PRINZIDE,ZESTORETIC) 20-12.5 MG per tablet Take 1 tablet by mouth daily.  . meloxicam (MOBIC) 15 MG tablet Take 1 tablet (15 mg total) by mouth daily.  Marland Kitchen omeprazole (PRILOSEC) 20 MG capsule Take 1 capsule (20 mg total) by mouth daily.  . rosuvastatin (CRESTOR) 20 MG tablet Take 1 tablet (20 mg total) by mouth daily.   No facility-administered encounter medications on file as of 03/13/2015.     Review of Systems  Constitutional: Negative.   HENT: Positive for congestion (nasal), postnasal drip and sinus pressure.   Eyes: Negative.   Respiratory: Negative.   Cardiovascular: Negative.   Gastrointestinal: Negative.   Endocrine: Negative.   Genitourinary: Negative.   Musculoskeletal: Negative.   Skin: Negative.   Allergic/Immunologic: Negative.   Neurological: Negative.   Hematological: Negative.   Psychiatric/Behavioral: Negative.       Objective:   Physical Exam  Constitutional: She is oriented to person, place, and time. She appears well-developed and well-nourished. No  distress.  HENT:  Head: Normocephalic and atraumatic.  Right Ear: External ear normal.  Left Ear: External ear normal.  Mouth/Throat: Oropharynx is clear and moist.  Minimal pharyngeal redness posteriorly Nasal turbinate congestion bilaterally  Eyes: Conjunctivae and EOM are normal. Pupils are equal, round, and reactive to light. Right eye exhibits no discharge. Left eye exhibits no discharge. No scleral icterus.  Neck: Normal range of motion. Neck supple. No thyromegaly present.  Slight tenderness right occiput  Cardiovascular: Normal rate, regular rhythm and normal heart sounds.  Exam reveals no friction rub.   No murmur heard. Pulmonary/Chest: Effort normal and breath sounds normal. No respiratory distress. She has no wheezes. She has no rales. She exhibits no tenderness.  Dry cough  Abdominal: Soft. Bowel sounds are normal.  Musculoskeletal: Normal range of motion. She exhibits tenderness. She exhibits no edema.  Occipital tendinitis right  Lymphadenopathy:    She has no cervical adenopathy.  Neurological: She is alert and oriented to person, place, and time.  Skin: Skin is warm and dry. No rash noted.  Psychiatric: She has a normal mood and affect. Her behavior is normal. Judgment and thought content normal.  Nursing note and vitals reviewed.  BP 109/59 mmHg  Pulse 77  Temp(Src) 98.6 F (37 C) (Oral)  Ht 5\' 3"  (1.6 m)  Wt 224 lb (101.606 kg)  BMI 39.69 kg/m2        Assessment & Plan:  1. Ethmoid sinusitis, unspecified chronicity -Use nasal saline frequently as directed 3 or 4 times daily -Take antibiotic as directed -  amoxicillin (AMOXIL) 500 MG capsule; Take 1 capsule (500 mg total) by mouth 3 (three) times daily.  Dispense: 30 capsule; Refill: 0  2. Allergic rhinitis due to pollen -Use steroid nose spray along with nasal saline. - fluticasone (FLONASE) 50 MCG/ACT nasal spray; Place 2 sprays into both nostrils daily.  Dispense: 16 g; Refill: 6  3. Occipital  tendinitis -Use Aleve twice daily after breakfast and supper and use warm wet compresses to posterior neck 20 minutes 3 or 4 times daily  4. Cough -Take Mucinex twice daily with a large glass of water  Patient Instructions  Use nasal saline in each nostril 3 or 4 times daily and use Flonase 1-2 sprays each nostril at bedtime Take Mucinex, maximum strength, blue and white in color, 1 twice daily with a large glass of water for cough and congestion Continue to use Aleve twice daily after breakfast and supper to help the soreness in the posterior neck Take antibiotic as directed until completed Use warm wet compresses posterior neck 20 minutes 3 or 4 times daily   Arrie Senate MD

## 2015-03-15 ENCOUNTER — Encounter: Payer: Self-pay | Admitting: *Deleted

## 2015-03-24 ENCOUNTER — Encounter: Payer: Self-pay | Admitting: Gastroenterology

## 2015-04-07 ENCOUNTER — Encounter: Payer: Self-pay | Admitting: Nurse Practitioner

## 2015-04-07 ENCOUNTER — Other Ambulatory Visit: Payer: Self-pay | Admitting: *Deleted

## 2015-04-07 ENCOUNTER — Ambulatory Visit (INDEPENDENT_AMBULATORY_CARE_PROVIDER_SITE_OTHER): Payer: Medicare Other | Admitting: Nurse Practitioner

## 2015-04-07 VITALS — BP 114/62 | HR 96 | Temp 98.9°F | Ht 63.0 in | Wt 222.0 lb

## 2015-04-07 DIAGNOSIS — R197 Diarrhea, unspecified: Secondary | ICD-10-CM

## 2015-04-07 DIAGNOSIS — J019 Acute sinusitis, unspecified: Secondary | ICD-10-CM | POA: Diagnosis not present

## 2015-04-07 MED ORDER — CHLORPHEN-PE-ACETAMINOPHEN 4-10-325 MG PO TABS: 1.0000 | ORAL_TABLET | Freq: Four times a day (QID) | ORAL | Status: DC | PRN

## 2015-04-07 MED ORDER — AZITHROMYCIN 250 MG PO TABS: ORAL_TABLET | ORAL | Status: DC

## 2015-04-07 NOTE — Patient Instructions (Signed)

## 2015-04-07 NOTE — Progress Notes (Signed)
  Subjective:     Sarah Benton is a 69 y.o. female who presents for evaluation of sinus pain. Symptoms include: clear rhinorrhea, congestion, foul breath, foul rhinorrhea, nasal congestion and sinus pressure. Onset of symptoms was 2 weeks ago. Symptoms have been unchanged since that time. Past history is significant for no history of pneumonia or bronchitis. Patient is a non-smoker.  * was on amoxicilin 3 weeks ago and has had diarrhea every since.  The following portions of the patient's history were reviewed and updated as appropriate: allergies, past family history, past medical history, past social history, past surgical history and problem list.  Review of Systems Pertinent items are noted in HPI.   Objective:    BP 114/62 mmHg  Pulse 96  Temp(Src) 98.9 F (37.2 C) (Oral)  Ht 5\' 3"  (1.6 m)  Wt 222 lb (100.699 kg)  BMI 39.34 kg/m2 General appearance: alert and cooperative Eyes: conjunctivae/corneas clear. PERRL, EOM's intact. Fundi benign. Ears: normal TM's and external ear canals both ears Nose: clear discharge, moderate congestion, no sinus tenderness Throat: lips, mucosa, and tongue normal; teeth and gums normal Neck: no adenopathy, no carotid bruit, no JVD, supple, symmetrical, trachea midline and thyroid not enlarged, symmetric, no tenderness/mass/nodules Lungs: clear to auscultation bilaterally Heart: regular rate and rhythm, S1, S2 normal, no murmur, click, rub or gallop    Assessment:    Acute bacterial sinusitis.   Diarhea   Plan:  1. Take meds as prescribed 2. Use a cool mist humidifier especially during the winter months and when heat has been humid. 3. Use saline nose sprays frequently 4. Saline irrigations of the nose can be very helpful if done frequently.  * 4X daily for 1 week*  * Use of a nettie pot can be helpful with this. Follow directions with this* 5. Drink plenty of fluids 6. Keep thermostat turn down low 7.For any cough or congestion  Use  plain Mucinex- regular strength or max strength is fine   * Children- consult with Pharmacist for dosing 8. For fever or aces or pains- take tylenol or ibuprofen appropriate for age and weight.  * for fevers greater than 101 orally you may alternate ibuprofen and tylenol every  3 hours.   Meds ordered this encounter  Medications  . azithromycin (ZITHROMAX Z-PAK) 250 MG tablet    Sig: As directed    Dispense:  1 each    Refill:  0    Order Specific Question:  Supervising Provider    Answer:  Chipper Herb [[id number]]  . Chlorphen-PE-Acetaminophen 4-10-325 MG TABS    Sig: Take 1 tablet by mouth every 6 (six) hours as needed.    Dispense:  20 tablet    Refill:  0    Order Specific Question:  Supervising Provider    Answer:  Chipper Herb [[id number]]   Orders Placed This Encounter  Procedures  . Cdiff NAA+O+P+Stool Culture    Standing Status: Future     Number of Occurrences:      Standing Expiration Date: 04/06/2016    RTO prn  [name], FNP

## 2015-04-18 ENCOUNTER — Telehealth: Payer: Self-pay | Admitting: Nurse Practitioner

## 2015-04-18 ENCOUNTER — Telehealth: Payer: Self-pay | Admitting: *Deleted

## 2015-04-18 NOTE — Telephone Encounter (Signed)
Checking with lab for results and will call back when we have details.

## 2015-04-18 NOTE — Telephone Encounter (Signed)
Patient will have to return for a new set of bottles to do the stool test.   Prior sample was not sent to lab.

## 2015-04-19 ENCOUNTER — Encounter: Payer: Self-pay | Admitting: Nurse Practitioner

## 2015-05-27 ENCOUNTER — Ambulatory Visit (INDEPENDENT_AMBULATORY_CARE_PROVIDER_SITE_OTHER): Payer: Medicare Other | Admitting: Family Medicine

## 2015-05-27 VITALS — BP 134/72 | HR 64 | Temp 98.1°F | Ht 63.0 in | Wt 227.0 lb

## 2015-05-27 DIAGNOSIS — N3090 Cystitis, unspecified without hematuria: Secondary | ICD-10-CM | POA: Diagnosis not present

## 2015-05-27 DIAGNOSIS — R300 Dysuria: Secondary | ICD-10-CM | POA: Diagnosis not present

## 2015-05-27 LAB — POCT URINALYSIS DIPSTICK

## 2015-05-27 LAB — POCT UA - MICROSCOPIC ONLY
CASTS, UR, LPF, POC: NEGATIVE
CRYSTALS, UR, HPF, POC: NEGATIVE
MUCUS UA: NEGATIVE

## 2015-05-27 MED ORDER — CIPROFLOXACIN HCL 500 MG PO TABS: 500.0000 mg | ORAL_TABLET | Freq: Two times a day (BID) | ORAL | Status: DC

## 2015-05-27 NOTE — Progress Notes (Signed)
Subjective:  Patient ID: Sarah Benton, female    DOB: May 24, 1946  Age: 69 y.o. MRN: [id number]  CC: urinary pressure   HPI Sarah Benton presents for 2 days of pressure and urinary frequency. She denies urgency. Tried Azo with no relief for the last day. No fever . No Nausea. No diarrhea. No abd pain  History Sarah Benton has a past medical history of GERD (gastroesophageal reflux disease); Esophageal stricture; Hypertension; Hyperlipidemia; Edema; Ruptured lumbar disc; Arthritis; Obesity; and Anemia.   She has past surgical history that includes Lumbar laminectomy (1998); Breast biopsy (1993); Abdominal hysterectomy (2001); Tubal ligation (1979); and Back surgery (1998).   Her family history includes Ovarian cancer in her mother; Parkinsonism (age of onset: 58) in her mother; Prostate cancer (age of onset: 31) in her father. There is no history of Colon cancer.She reports that she has never smoked. She has never used smokeless tobacco. She reports that she does not drink alcohol or use illicit drugs.  Outpatient Prescriptions Prior to Visit  Medication Sig Dispense Refill  . Cholecalciferol (VITAMIN D3) 2000 UNITS capsule Take 2,000 Units by mouth daily.      Marland Kitchen lisinopril-hydrochlorothiazide (PRINZIDE,ZESTORETIC) 20-12.5 MG per tablet Take 1 tablet by mouth daily. 90 tablet 1  . meloxicam (MOBIC) 15 MG tablet Take 1 tablet (15 mg total) by mouth daily. 30 tablet 5  . omeprazole (PRILOSEC) 20 MG capsule Take 1 capsule (20 mg total) by mouth daily. 30 capsule 5  . rosuvastatin (CRESTOR) 20 MG tablet Take 1 tablet (20 mg total) by mouth daily. 90 tablet 1  . azithromycin (ZITHROMAX Z-PAK) 250 MG tablet As directed 1 each 0  . Chlorphen-PE-Acetaminophen 4-10-325 MG TABS Take 1 tablet by mouth every 6 (six) hours as needed. 20 tablet 0  . fluticasone (FLONASE) 50 MCG/ACT nasal spray Place 2 sprays into both nostrils daily. 16 g 6   No facility-administered medications prior to visit.     ROS Review of Systems  Constitutional: Negative for fever, chills and diaphoresis.  HENT: Negative for congestion.   Eyes: Negative for visual disturbance.  Respiratory: Negative for cough and shortness of breath.   Cardiovascular: Negative for chest pain and palpitations.  Gastrointestinal: Negative for nausea, diarrhea and constipation.  Genitourinary: Positive for dysuria, urgency and frequency. Negative for hematuria, flank pain, decreased urine volume, menstrual problem and pelvic pain.  Musculoskeletal: Negative for joint swelling and arthralgias.  Skin: Negative for rash.  Neurological: Negative for dizziness and numbness.    Objective:  BP 134/72 mmHg  Pulse 64  Temp(Src) 98.1 F (36.7 C) (Oral)  Ht 5\' 3"  (1.6 m)  Wt 227 lb (102.967 kg)  BMI 40.22 kg/m2  BP Readings from Last 3 Encounters:  05/27/15 134/72  04/07/15 114/62  03/13/15 109/59    Wt Readings from Last 3 Encounters:  05/27/15 227 lb (102.967 kg)  04/07/15 222 lb (100.699 kg)  03/13/15 224 lb (101.606 kg)     Physical Exam  Constitutional: She is oriented to person, place, and time. She appears well-developed and well-nourished.  HENT:  Head: Normocephalic and atraumatic.  Cardiovascular: Normal rate and regular rhythm.   No murmur heard. Pulmonary/Chest: Effort normal and breath sounds normal.  Abdominal: Soft. Bowel sounds are normal. She exhibits no mass. There is tenderness. There is no rebound and no guarding.  Neurological: She is alert and oriented to person, place, and time.  Skin: Skin is warm and dry.  Psychiatric: She has a normal mood and affect.  Her behavior is normal.    No results found for: HGBA1C  Lab Results  Component Value Date   WBC 10.9* 01/23/2015   HGB 12.7 01/23/2015   HCT 40.7 01/23/2015   PLT 197 06/15/2013   GLUCOSE 99 01/25/2015   CHOL 160 01/25/2015   TRIG 117 01/25/2015   HDL 55 01/25/2015   LDLCALC 85 01/07/2014   ALT 12 01/25/2015   AST 15  01/25/2015   NA 143 01/25/2015   K 4.7 01/25/2015   CL 103 01/25/2015   CREATININE 1.01* 01/25/2015   BUN 21 01/25/2015   CO2 25 01/25/2015    Mm Screening Breast Tomo Bilateral  09/06/2014   CLINICAL DATA:  Screening.  EXAM: DIGITAL SCREENING BILATERAL MAMMOGRAM WITH 3D TOMO WITH CAD  COMPARISON:  Previous exam(s).  ACR Breast Density Category b: There are scattered areas of fibroglandular density.  FINDINGS: There are no findings suspicious for malignancy. Images were processed with CAD.  IMPRESSION: No mammographic evidence of malignancy. A result letter of this screening mammogram will be mailed directly to the patient.  RECOMMENDATION: Screening mammogram in one year. (Code:[id number]-B-01Y)  BI-RADS CATEGORY  1: Negative.   Electronically Signed   By: Enrique Sack M.D.   On: 09/06/2014 11:05    Assessment & Plan:   [name] was seen today for urinary pressure.  Diagnoses and all orders for this visit:  Cystitis  Dysuria -     POCT UA - Microscopic Only -     POCT urinalysis dipstick -     Urine culture  Other orders -     ciprofloxacin (CIPRO) 500 MG tablet; Take 1 tablet (500 mg total) by mouth 2 (two) times daily.   I have discontinued Ms. Baltes's fluticasone, azithromycin, and Chlorphen-PE-Acetaminophen. I am also having her start on ciprofloxacin. Additionally, [name] maintain her Vitamin D3, omeprazole, meloxicam, lisinopril-hydrochlorothiazide, and rosuvastatin.  Meds ordered this encounter  Medications  . ciprofloxacin (CIPRO) 500 MG tablet    Sig: Take 1 tablet (500 mg total) by mouth 2 (two) times daily.    Dispense:  10 tablet    Refill:  0     Follow-up: Return if symptoms worsen or fail to improve.  Claretta Fraise, M.D.

## 2015-05-30 ENCOUNTER — Other Ambulatory Visit: Payer: Self-pay | Admitting: Family Medicine

## 2015-05-30 LAB — URINE CULTURE

## 2015-05-30 MED ORDER — AMOXICILLIN 875 MG PO TABS: 875.0000 mg | ORAL_TABLET | Freq: Two times a day (BID) | ORAL | Status: DC

## 2015-07-10 ENCOUNTER — Encounter: Payer: Self-pay | Admitting: Gastroenterology

## 2015-07-28 ENCOUNTER — Ambulatory Visit (INDEPENDENT_AMBULATORY_CARE_PROVIDER_SITE_OTHER): Payer: Medicare Other | Admitting: Nurse Practitioner

## 2015-07-28 ENCOUNTER — Ambulatory Visit (INDEPENDENT_AMBULATORY_CARE_PROVIDER_SITE_OTHER): Payer: Medicare Other

## 2015-07-28 ENCOUNTER — Encounter: Payer: Self-pay | Admitting: Nurse Practitioner

## 2015-07-28 VITALS — BP 122/82 | HR 55 | Temp 97.5°F | Ht 63.0 in | Wt 225.0 lb

## 2015-07-28 DIAGNOSIS — Z23 Encounter for immunization: Secondary | ICD-10-CM

## 2015-07-28 DIAGNOSIS — K219 Gastro-esophageal reflux disease without esophagitis: Secondary | ICD-10-CM

## 2015-07-28 DIAGNOSIS — I10 Essential (primary) hypertension: Secondary | ICD-10-CM

## 2015-07-28 DIAGNOSIS — Z1159 Encounter for screening for other viral diseases: Secondary | ICD-10-CM

## 2015-07-28 DIAGNOSIS — R601 Generalized edema: Secondary | ICD-10-CM | POA: Diagnosis not present

## 2015-07-28 DIAGNOSIS — E782 Mixed hyperlipidemia: Secondary | ICD-10-CM | POA: Diagnosis not present

## 2015-07-28 MED ORDER — ROSUVASTATIN CALCIUM 20 MG PO TABS: 20.0000 mg | ORAL_TABLET | Freq: Every day | ORAL | Status: DC

## 2015-07-28 MED ORDER — OMEPRAZOLE 20 MG PO CPDR: 20.0000 mg | DELAYED_RELEASE_CAPSULE | Freq: Every day | ORAL | Status: DC

## 2015-07-28 MED ORDER — LISINOPRIL-HYDROCHLOROTHIAZIDE 20-12.5 MG PO TABS: 1.0000 | ORAL_TABLET | Freq: Every day | ORAL | Status: DC

## 2015-07-28 NOTE — Progress Notes (Signed)
Subjective:    Patient ID: Sarah Benton, female    DOB: [birth year], 69 y.o.   MRN: [id number]   Patient here today for follow up of chronic medical problems.   Hypertension This is a chronic problem. The current episode started more than 1 year ago. The problem is controlled. Pertinent negatives include no chest pain, headaches, neck pain, palpitations or shortness of breath. Risk factors for coronary artery disease include sedentary lifestyle, dyslipidemia, obesity and post-menopausal state. Past treatments include ACE inhibitors and diuretics. The current treatment provides significant improvement. Compliance problems include diet and exercise.  There is no history of CAD/MI.  Hyperlipidemia This is a chronic problem. The current episode started more than 1 year ago. Recent lipid tests were reviewed and are variable. Factors aggravating her hyperlipidemia include thiazides. Pertinent negatives include no chest pain or shortness of breath. Current antihyperlipidemic treatment includes statins. The current treatment provides moderate improvement of lipids. Compliance problems include adherence to diet and adherence to exercise.  Risk factors for coronary artery disease include dyslipidemia, hypertension, obesity and post-menopausal.  GERD Omeprazole works well to kep symptoms under control anemia Currently on no supplements Thoracic and lumbar back pain Takes mobic which helps with discomfort Peripheral edema/ NO CHF Takes HCTZ in her blood pressure med- edema only occurs occasionally.  Review of Systems  Constitutional: Negative.   HENT: Negative.   Respiratory: Negative for shortness of breath.   Cardiovascular: Negative for chest pain and palpitations.  Genitourinary: Negative.   Musculoskeletal: Negative for neck pain.  Neurological: Negative for headaches.  Psychiatric/Behavioral: Negative.   All other systems reviewed and are negative.      Objective:   Physical Exam   Constitutional: She is oriented to person, place, and time. She appears well-developed and well-nourished.  HENT:  Nose: Nose normal.  Mouth/Throat: Oropharynx is clear and moist.  Eyes: EOM are normal.  Neck: Trachea normal, normal range of motion and full passive range of motion without pain. Neck supple. No JVD present. Carotid bruit is not present. No thyromegaly present.  Cardiovascular: Normal rate, regular rhythm, normal heart sounds and intact distal pulses.  Exam reveals no gallop and no friction rub.   No murmur heard. Pulmonary/Chest: Effort normal and breath sounds normal.  Abdominal: Soft. Bowel sounds are normal. She exhibits no distension and no mass. There is no tenderness.  Musculoskeletal: Normal range of motion.  Lymphadenopathy:    She has no cervical adenopathy.  Neurological: She is alert and oriented to person, place, and time. She has normal reflexes.  Skin: Skin is warm and dry.  Psychiatric: She has a normal mood and affect. Her behavior is normal. Judgment and thought content normal.    BP 122/82 mmHg  Pulse 55  Temp(Src) 97.5 F (36.4 C) (Oral)  Ht _0  (1.6 m)  Wt 225 lb (102.059 kg)  BMI 39.87 kg/m2  Chest xray- no cardiopulmonary abnormalities-Preliminary reading by Ronnald Collum, FNP  [facility]  EKG- sinus bradycardia-[name], FNP       Assessment & Plan:  1. Essential hypertension, benign Do not add salt diet - CMP14+EGFR - lisinopril-hydrochlorothiazide (PRINZIDE,ZESTORETIC) 20-12.5 MG tablet; Take 1 tablet by mouth daily.  Dispense: 90 tablet; Refill: 1  2. Gastroesophageal reflux disease without esophagitis Avoid spicy foods Do not eat 2 hours prior to bedtime - omeprazole (PRILOSEC) 20 MG capsule; Take 1 capsule (20 mg total) by mouth daily.  Dispense: 30 capsule; Refill: 5   3. Mixed hyperlipidemia Low fat diet -  Lipid panel - rosuvastatin (CRESTOR) 20 MG tablet; Take 1 tablet (20 mg total) by mouth daily.  Dispense: 90  tablet; Refill: 1  4. Morbid obesity, unspecified obesity type ([place]) Discussed diet and exercise for person with BMI >25 Will recheck weight in 3-6 months   5. Generalized edema Elevate legs when sitting  6. Need for hepatitis C screening test - Hepatitis C antibody    Labs pending Health maintenance reviewed Diet and exercise encouraged Continue all meds Follow up  In 3 months   [place], FNP

## 2015-07-28 NOTE — Patient Instructions (Signed)
Health Maintenance, Female Adopting a healthy lifestyle and getting preventive care can go a long way to promote health and wellness. Talk with your health care provider about what schedule of regular examinations is right for you. This is a good chance for you to check in with your provider about disease prevention and staying healthy. In between checkups, there are plenty of things you can do on your own. Experts have done a lot of research about which lifestyle changes and preventive measures are most likely to keep you healthy. Ask your health care provider for more information. WEIGHT AND DIET  Eat a healthy diet  Be sure to include plenty of vegetables, fruits, low-fat dairy products, and lean protein.  Do not eat a lot of foods high in solid fats, added sugars, or salt.  Get regular exercise. This is one of the most important things you can do for your health.  Most adults should exercise for at least 150 minutes each week. The exercise should increase your heart rate and make you sweat (moderate-intensity exercise).  Most adults should also do strengthening exercises at least twice a week. This is in addition to the moderate-intensity exercise.  Maintain a healthy weight  Body mass index (BMI) is a measurement that can be used to identify possible weight problems. It estimates body fat based on height and weight. Your health care provider can help determine your BMI and help you achieve or maintain a healthy weight.  For females 20 years of age and older:   A BMI below 18.5 is considered underweight.  A BMI of 18.5 to 24.9 is normal.  A BMI of 25 to 29.9 is considered overweight.  A BMI of 30 and above is considered obese.  Watch levels of cholesterol and blood lipids  You should start having your blood tested for lipids and cholesterol at 71 years of age, then have this test every 5 years.  You may need to have your cholesterol levels checked more often if:  Your lipid  or cholesterol levels are high.  You are older than 71 years of age.  You are at high risk for heart disease.  CANCER SCREENING   Lung Cancer  Lung cancer screening is recommended for adults 55-80 years old who are at high risk for lung cancer because of a history of smoking.  A yearly low-dose CT scan of the lungs is recommended for people who:  Currently smoke.  Have quit within the past 15 years.  Have at least a 30-pack-year history of smoking. A pack year is smoking an average of one pack of cigarettes a day for 1 year.  Yearly screening should continue until it has been 15 years since you quit.  Yearly screening should stop if you develop a health problem that would prevent you from having lung cancer treatment.  Breast Cancer  Practice breast self-awareness. This means understanding how your breasts normally appear and feel.  It also means doing regular breast self-exams. Let your health care provider know about any changes, no matter how small.  If you are in your 20s or 30s, you should have a clinical breast exam (CBE) by a health care provider every 1-3 years as part of a regular health exam.  If you are 40 or older, have a CBE every year. Also consider having a breast X-ray (mammogram) every year.  If you have a family history of breast cancer, talk to your health care provider about genetic screening.  If you   are at high risk for breast cancer, talk to your health care provider about having an MRI and a mammogram every year.  Breast cancer gene (BRCA) assessment is recommended for women who have family members with BRCA-related cancers. BRCA-related cancers include:  Breast.  Ovarian.  Tubal.  Peritoneal cancers.  Results of the assessment will determine the need for genetic counseling and BRCA1 and BRCA2 testing. Cervical Cancer Your health care provider may recommend that you be screened regularly for cancer of the pelvic organs (ovaries, uterus, and  vagina). This screening involves a pelvic examination, including checking for microscopic changes to the surface of your cervix (Pap test). You may be encouraged to have this screening done every 3 years, beginning at age 21.  For women ages 30-65, health care providers may recommend pelvic exams and Pap testing every 3 years, or they may recommend the Pap and pelvic exam, combined with testing for human papilloma virus (HPV), every 5 years. Some types of HPV increase your risk of cervical cancer. Testing for HPV may also be done on women of any age with unclear Pap test results.  Other health care providers may not recommend any screening for nonpregnant women who are considered low risk for pelvic cancer and who do not have symptoms. Ask your health care provider if a screening pelvic exam is right for you.  If you have had past treatment for cervical cancer or a condition that could lead to cancer, you need Pap tests and screening for cancer for at least 20 years after your treatment. If Pap tests have been discontinued, your risk factors (such as having a new sexual partner) need to be reassessed to determine if screening should resume. Some women have medical problems that increase the chance of getting cervical cancer. In these cases, your health care provider may recommend more frequent screening and Pap tests. Colorectal Cancer  This type of cancer can be detected and often prevented.  Routine colorectal cancer screening usually begins at 71 years of age and continues through 71 years of age.  Your health care provider may recommend screening at an earlier age if you have risk factors for colon cancer.  Your health care provider may also recommend using home test kits to check for hidden blood in the stool.  A small camera at the end of a tube can be used to examine your colon directly (sigmoidoscopy or colonoscopy). This is done to check for the earliest forms of colorectal  cancer.  Routine screening usually begins at age 50.  Direct examination of the colon should be repeated every 5-10 years through 71 years of age. However, you may need to be screened more often if early forms of precancerous polyps or small growths are found. Skin Cancer  Check your skin from head to toe regularly.  Tell your health care provider about any new moles or changes in moles, especially if there is a change in a mole's shape or color.  Also tell your health care provider if you have a mole that is larger than the size of a pencil eraser.  Always use sunscreen. Apply sunscreen liberally and repeatedly throughout the day.  Protect yourself by wearing long sleeves, pants, a wide-brimmed hat, and sunglasses whenever you are outside. HEART DISEASE, DIABETES, AND HIGH BLOOD PRESSURE   High blood pressure causes heart disease and increases the risk of stroke. High blood pressure is more likely to develop in:  People who have blood pressure in the high end   of the normal range (130-139/85-89 mm Hg).  People who are overweight or obese.  People who are African American.  If you are 38-23 years of age, have your blood pressure checked every 3-5 years. If you are 61 years of age or older, have your blood pressure checked every year. You should have your blood pressure measured twice--once when you are at a hospital or clinic, and once when you are not at a hospital or clinic. Record the average of the two measurements. To check your blood pressure when you are not at a hospital or clinic, you can use:  An automated blood pressure machine at a pharmacy.  A home blood pressure monitor.  If you are between 45 years and 39 years old, ask your health care provider if you should take aspirin to prevent strokes.  Have regular diabetes screenings. This involves taking a blood sample to check your fasting blood sugar level.  If you are at a normal weight and have a low risk for diabetes,  have this test once every three years after 71 years of age.  If you are overweight and have a high risk for diabetes, consider being tested at a younger age or more often. PREVENTING INFECTION  Hepatitis B  If you have a higher risk for hepatitis B, you should be screened for this virus. You are considered at high risk for hepatitis B if:  You were born in a country where hepatitis B is common. Ask your health care provider which countries are considered high risk.  Your parents were born in a high-risk country, and you have not been immunized against hepatitis B (hepatitis B vaccine).  You have HIV or AIDS.  You use needles to inject street drugs.  You live with someone who has hepatitis B.  You have had sex with someone who has hepatitis B.  You get hemodialysis treatment.  You take certain medicines for conditions, including cancer, organ transplantation, and autoimmune conditions. Hepatitis C  Blood testing is recommended for:  Everyone born from [birth year] through 1967.  Anyone with known risk factors for hepatitis C. Sexually transmitted infections (STIs)  You should be screened for sexually transmitted infections (STIs) including gonorrhea and chlamydia if:  You are sexually active and are younger than 71 years of age.  You are older than 71 years of age and your health care provider tells you that you are at risk for this type of infection.  Your sexual activity has changed since you were last screened and you are at an increased risk for chlamydia or gonorrhea. Ask your health care provider if you are at risk.  If you do not have HIV, but are at risk, it may be recommended that you take a prescription medicine daily to prevent HIV infection. This is called pre-exposure prophylaxis (PrEP). You are considered at risk if:  You are sexually active and do not regularly use condoms or know the HIV status of your partner(s).  You take drugs by injection.  You are sexually  active with a partner who has HIV. Talk with your health care provider about whether you are at high risk of being infected with HIV. If you choose to begin PrEP, you should first be tested for HIV. You should then be tested every 3 months for as long as you are taking PrEP.  PREGNANCY   If you are premenopausal and you may become pregnant, ask your health care provider about preconception counseling.  If you may  become pregnant, take 400 to 800 micrograms (mcg) of folic acid every day.  If you want to prevent pregnancy, talk to your health care provider about birth control (contraception). OSTEOPOROSIS AND MENOPAUSE   Osteoporosis is a disease in which the bones lose minerals and strength with aging. This can result in serious bone fractures. Your risk for osteoporosis can be identified using a bone density scan.  If you are 61 years of age or older, or if you are at risk for osteoporosis and fractures, ask your health care provider if you should be screened.  Ask your health care provider whether you should take a calcium or vitamin D supplement to lower your risk for osteoporosis.  Menopause may have certain physical symptoms and risks.  Hormone replacement therapy may reduce some of these symptoms and risks. Talk to your health care provider about whether hormone replacement therapy is right for you.  HOME CARE INSTRUCTIONS   Schedule regular health, dental, and eye exams.  Stay current with your immunizations.   Do not use any tobacco products including cigarettes, chewing tobacco, or electronic cigarettes.  If you are pregnant, do not drink alcohol.  If you are breastfeeding, limit how much and how often you drink alcohol.  Limit alcohol intake to no more than 1 drink per day for nonpregnant women. One drink equals 12 ounces of beer, 5 ounces of wine, or 1 ounces of hard liquor.  Do not use street drugs.  Do not share needles.  Ask your health care provider for help if  you need support or information about quitting drugs.  Tell your health care provider if you often feel depressed.  Tell your health care provider if you have ever been abused or do not feel safe at home.   This information is not intended to replace advice given to you by your health care provider. Make sure you discuss any questions you have with your health care provider.   Document Released: 03/25/2011 Document Revised: 09/30/2014 Document Reviewed: 08/11/2013 Elsevier Interactive Patient Education [employer].

## 2015-07-29 LAB — CMP14+EGFR
A/G RATIO: 1.9 (ref 1.1–2.5)
ALT: 26 IU/L (ref 0–32)
AST: 23 IU/L (ref 0–40)
Albumin: 4.1 g/dL (ref 3.5–4.8)
Alkaline Phosphatase: 130 IU/L — ABNORMAL HIGH (ref 39–117)
BUN/Creatinine Ratio: 19 (ref 11–26)
BUN: 15 mg/dL (ref 8–27)
Bilirubin Total: 0.4 mg/dL (ref 0.0–1.2)
CO2: 24 mmol/L (ref 18–29)
CREATININE: 0.81 mg/dL (ref 0.57–1.00)
Calcium: 9.4 mg/dL (ref 8.7–10.3)
Chloride: 100 mmol/L (ref 97–106)
GFR calc Af Amer: 85 mL/min/{1.73_m2} (ref >59)
GFR calc non Af Amer: 73 mL/min/{1.73_m2} (ref >59)
GLOBULIN, TOTAL: 2.2 g/dL (ref 1.5–4.5)
Glucose: 94 mg/dL (ref 65–99)
POTASSIUM: 4.3 mmol/L (ref 3.5–5.2)
SODIUM: 142 mmol/L (ref 136–144)
Total Protein: 6.3 g/dL (ref 6.0–8.5)

## 2015-07-29 LAB — LIPID PANEL
Chol/HDL Ratio: 3.5 ratio units (ref 0.0–4.4)
Cholesterol, Total: 180 mg/dL (ref 100–199)
HDL: 51 mg/dL (ref >39)
LDL Calculated: 99 mg/dL (ref 0–99)
TRIGLYCERIDES: 149 mg/dL (ref 0–149)
VLDL Cholesterol Cal: 30 mg/dL (ref 5–40)

## 2015-07-29 LAB — HEPATITIS C ANTIBODY: Hep C Virus Ab: <0.1 s/co ratio (ref 0.0–0.9)

## 2015-07-31 ENCOUNTER — Other Ambulatory Visit: Payer: Self-pay

## 2015-07-31 ENCOUNTER — Ambulatory Visit: Payer: 59 | Admitting: Nurse Practitioner

## 2015-07-31 DIAGNOSIS — Z1231 Encounter for screening mammogram for malignant neoplasm of breast: Secondary | ICD-10-CM

## 2015-08-02 ENCOUNTER — Ambulatory Visit: Payer: Medicare Other | Admitting: Nurse Practitioner

## 2015-08-05 ENCOUNTER — Other Ambulatory Visit: Payer: Self-pay | Admitting: Nurse Practitioner

## 2015-08-31 ENCOUNTER — Ambulatory Visit (INDEPENDENT_AMBULATORY_CARE_PROVIDER_SITE_OTHER): Payer: Medicare Other | Admitting: Pediatrics

## 2015-08-31 ENCOUNTER — Encounter: Payer: Self-pay | Admitting: Pediatrics

## 2015-08-31 VITALS — BP 145/75 | HR 66 | Temp 98.3°F | Ht 63.0 in | Wt 229.0 lb

## 2015-08-31 DIAGNOSIS — Z1331 Encounter for screening for depression: Secondary | ICD-10-CM

## 2015-08-31 DIAGNOSIS — H9202 Otalgia, left ear: Secondary | ICD-10-CM | POA: Diagnosis not present

## 2015-08-31 DIAGNOSIS — Z1389 Encounter for screening for other disorder: Secondary | ICD-10-CM | POA: Diagnosis not present

## 2015-08-31 NOTE — Progress Notes (Signed)
    Subjective:    Patient ID: Sarah Benton, female    DOB: April 15, 1946, 69 y.o.   MRN: [id number]  CC: Ear Pain   HPI: Sarah Benton is a 69 y.o. female presenting for Ear Pain  For about a week havign pain in L ear Using ear drops Not using q tips No submersion in water No fevers No URI sx, no sore throat or congestion She did have dentist appt recently, has a wisdom tooth that she needs to have removed   Depression screen [facility] 2/9 08/31/2015 07/28/2015 03/13/2015 01/25/2015 01/10/2015  Decreased Interest 0 0 0 1 0  Down, Depressed, Hopeless 0 1 0 0 0  PHQ - 2 Score 0 1 0 1 0     Relevant past medical, surgical, family and social history reviewed and updated as indicated. Interim medical history since our last visit reviewed. Allergies and medications reviewed and updated.    ROS: Per HPI unless specifically indicated above  History  Smoking status  . Never Smoker   Smokeless tobacco  . Never Used    Past Medical History Patient Active Problem List   Diagnosis Date Noted  . Generalized edema 07/28/2015  . Acute thoracic back pain 06/12/2013  . Normocytic anemia 04/06/2012  . GERD 11/08/2010  . MIXED HYPERLIPIDEMIA 12/12/2008  . Essential hypertension, benign 12/12/2008  . Morbid obesity ([place]) 12/12/2008    Current Outpatient Prescriptions  Medication Sig Dispense Refill  . Cholecalciferol (VITAMIN D3) 2000 UNITS capsule Take 2,000 Units by mouth daily.      Marland Kitchen lisinopril-hydrochlorothiazide (PRINZIDE,ZESTORETIC) 20-12.5 MG tablet Take 1 tablet by mouth daily. 90 tablet 1  . meloxicam (MOBIC) 15 MG tablet Take 1 tablet (15 mg total) by mouth daily. 30 tablet 2  . omeprazole (PRILOSEC) 20 MG capsule Take 1 capsule (20 mg total) by mouth daily. 30 capsule 5  . rosuvastatin (CRESTOR) 20 MG tablet Take 1 tablet (20 mg total) by mouth daily. 90 tablet 1   No current facility-administered medications for this visit.       Objective:    BP 145/75 mmHg   Pulse 66  Temp(Src) 98.3 F (36.8 C) (Oral)  Ht 5\' 3"  (1.6 m)  Wt 229 lb (103.874 kg)  BMI 40.58 kg/m2  Wt Readings from Last 3 Encounters:  08/31/15 229 lb (103.874 kg)  07/28/15 225 lb (102.059 kg)  05/27/15 227 lb (102.967 kg)     Gen: NAD, alert, cooperative with exam, NCAT EYES: EOMI, no scleral injection or icterus ENT:  Ear canal with some cerumen occlusion L side, TMs pearly gray b/l after extraction, OP without erythema LYMPH: no cervical LAD CV: NRRR, normal S1/S2, no murmur, distal pulses 2+ b/l Resp: CTABL, no wheezes, normal WOB Ext: No edema, warm Neuro: Alert and oriented     Assessment & Plan:    Sarah Benton was seen today for ear pain and fullness. TM completely normal after removal of some non-occlusive cerumen with ear curette.. Pt says ear feels better after removal.   Diagnoses and all orders for this visit:  Ear pain, left  Depression screen    Follow up plan: As needed  Assunta Found, MD [facility] 08/31/2015, 8:55 AM

## 2015-09-01 ENCOUNTER — Ambulatory Visit: Payer: Medicare Other | Admitting: Family Medicine

## 2015-09-06 ENCOUNTER — Ambulatory Visit (INDEPENDENT_AMBULATORY_CARE_PROVIDER_SITE_OTHER): Payer: Medicare Other | Admitting: Gastroenterology

## 2015-09-06 ENCOUNTER — Other Ambulatory Visit (INDEPENDENT_AMBULATORY_CARE_PROVIDER_SITE_OTHER): Payer: Medicare Other

## 2015-09-06 ENCOUNTER — Encounter: Payer: Self-pay | Admitting: Gastroenterology

## 2015-09-06 VITALS — BP 126/72 | HR 56 | Ht 63.0 in | Wt 230.2 lb

## 2015-09-06 DIAGNOSIS — R195 Other fecal abnormalities: Secondary | ICD-10-CM

## 2015-09-06 LAB — CBC WITH DIFFERENTIAL/PLATELET
BASOS PCT: 0.6 % (ref 0.0–3.0)
Basophils Absolute: 0.1 10*3/uL (ref 0.0–0.1)
EOS ABS: 0.2 10*3/uL (ref 0.0–0.7)
EOS PCT: 2.4 % (ref 0.0–5.0)
HEMATOCRIT: 36.1 % (ref 36.0–46.0)
HEMOGLOBIN: 11.8 g/dL — AB (ref 12.0–15.0)
LYMPHS PCT: 20.8 % (ref 12.0–46.0)
Lymphs Abs: 1.9 10*3/uL (ref 0.7–4.0)
MCHC: 32.7 g/dL (ref 30.0–36.0)
MCV: 83.7 fl (ref 78.0–100.0)
Monocytes Absolute: 0.8 10*3/uL (ref 0.1–1.0)
Monocytes Relative: 8.3 % (ref 3.0–12.0)
NEUTROS ABS: 6.3 10*3/uL (ref 1.4–7.7)
Neutrophils Relative %: 67.9 % (ref 43.0–77.0)
PLATELETS: 260 10*3/uL (ref 150.0–400.0)
RBC: 4.31 Mil/uL (ref 3.87–5.11)
RDW: 13.8 % (ref 11.5–15.5)
WBC: 9.3 10*3/uL (ref 4.0–10.5)

## 2015-09-06 NOTE — Patient Instructions (Signed)
Your physician has requested that you go to the basement for the following lab work before leaving today:CBC.  We will contact with the results of your lab work.  Thank you for choosing me and [facility].  Pricilla Riffle. Dagoberto Ligas., MD., [facility]

## 2015-09-06 NOTE — Progress Notes (Signed)
    History of Present Illness: This is a 69 year old female here for the evaluation of heme + stool. Patient sent in Hemoccults with the health insurance program and they were positive. She previously underwent colonoscopy for screening in September 2013 showing nonspecific erosions around the appendiceal orifice and small internal hemorrhoids. She underwent EGD in February 2012 with an esophageal stricture that was dilated and mild erosive gastritis noted. She has a history of anemia last CBC I can locate an Epic was September 2014 and hemoglobin was 11.9. Iron studies in March 2014 showed a low saturation. In addition she has had a low folate and a low B12 noted. She does not recall taking replacement for any of these. Denies weight loss, abdominal pain, constipation, diarrhea, change in stool caliber, melena, hematochezia, nausea, vomiting, dysphagia, reflux symptoms, chest pain.  Review of Systems: Pertinent positive and negative review of systems were noted in the above HPI section. All other review of systems were otherwise negative.  Current Medications, Allergies, Past Medical History, Past Surgical History, Family History and Social History were reviewed in [employer] record.  Physical Exam: General: Well developed, well nourished, no acute distress Head: Normocephalic and atraumatic Eyes:  sclerae anicteric, EOMI Ears: Normal auditory acuity Mouth: No deformity or lesions Neck: Supple, no masses or thyromegaly Lungs: Clear throughout to auscultation Heart: Regular rate and rhythm; no murmurs, rubs or bruits Abdomen: Soft, non tender and non distended. No masses, hepatosplenomegaly or hernias noted. Normal Bowel sounds Musculoskeletal: Symmetrical with no gross deformities  Skin: No lesions on visible extremities Pulses:  Normal pulses noted Extremities: No clubbing, cyanosis, edema or deformities noted Neurological: Alert oriented x 4, grossly  nonfocal Cervical Nodes:  No significant cervical adenopathy Inguinal Nodes: No significant inguinal adenopathy Psychological:  Alert and cooperative. Normal mood and affect  Assessment and Recommendations:  1. Occult blood in stool. Possible etiologies clued previously noted internal hemorrhoids, periappendiceal orifice erosions or erosive gastritis. Given she had a well-prepped and complete colonoscopy about 3 years ago she may not need repeat colonoscopy. However if she remains iron deficient I think we should plan to repeat a colonoscopy. CBC today. If she is anemic will repeat Fe studies, B12 and folate.    cc: Chevis Pretty, [place], [place] [address]

## 2015-09-07 ENCOUNTER — Other Ambulatory Visit: Payer: Self-pay

## 2015-09-07 DIAGNOSIS — D649 Anemia, unspecified: Secondary | ICD-10-CM

## 2015-09-08 ENCOUNTER — Other Ambulatory Visit (INDEPENDENT_AMBULATORY_CARE_PROVIDER_SITE_OTHER): Payer: Medicare Other

## 2015-09-08 ENCOUNTER — Ambulatory Visit
Admission: RE | Admit: 2015-09-08 | Discharge: 2015-09-08 | Disposition: A | Discharge status: Home / Self Care | Payer: Medicare Other | Source: Ambulatory Visit

## 2015-09-08 DIAGNOSIS — Z79899 Other long term (current) drug therapy: Secondary | ICD-10-CM | POA: Diagnosis not present

## 2015-09-08 DIAGNOSIS — D649 Anemia, unspecified: Secondary | ICD-10-CM

## 2015-09-08 DIAGNOSIS — Z1231 Encounter for screening mammogram for malignant neoplasm of breast: Secondary | ICD-10-CM

## 2015-09-08 LAB — VITAMIN B12: VITAMIN B 12: 204 pg/mL — AB (ref 211–911)

## 2015-09-08 LAB — IBC PANEL
Iron: 47 ug/dL (ref 42–145)
SATURATION RATIOS: 12.8 % — AB (ref 20.0–50.0)
Transferrin: 262 mg/dL (ref 212.0–360.0)

## 2015-09-08 LAB — FERRITIN: FERRITIN: 54.6 ng/mL (ref 10.0–291.0)

## 2015-09-08 LAB — FOLATE: Folate: 8.2 ng/mL (ref >5.9)

## 2015-09-12 ENCOUNTER — Other Ambulatory Visit: Payer: Self-pay

## 2015-09-12 MED ORDER — FERROUS SULFATE 325 (65 FE) MG PO TABS: 325.0000 mg | ORAL_TABLET | Freq: Two times a day (BID) | ORAL | Status: DC

## 2015-10-17 ENCOUNTER — Ambulatory Visit (AMBULATORY_SURGERY_CENTER): Payer: Self-pay

## 2015-10-17 VITALS — Ht 63.5 in | Wt 227.6 lb

## 2015-10-17 DIAGNOSIS — R195 Other fecal abnormalities: Secondary | ICD-10-CM

## 2015-10-17 DIAGNOSIS — D509 Iron deficiency anemia, unspecified: Secondary | ICD-10-CM

## 2015-10-17 MED ORDER — NA SULFATE-K SULFATE-MG SULF 17.5-3.13-1.6 GM/177ML PO SOLN: ORAL | Status: DC

## 2015-10-17 NOTE — Progress Notes (Signed)
Per pt, no allergies to soy or egg products.Pt not taking any weight loss meds or using  O2 at home. 

## 2015-10-25 ENCOUNTER — Encounter: Payer: Self-pay | Admitting: Gastroenterology

## 2015-10-25 ENCOUNTER — Ambulatory Visit (AMBULATORY_SURGERY_CENTER): Payer: Medicare Other | Admitting: Gastroenterology

## 2015-10-25 VITALS — BP 120/67 | HR 70 | Temp 98.3°F | Resp 26 | Ht 63.5 in | Wt 227.0 lb

## 2015-10-25 DIAGNOSIS — K2970 Gastritis, unspecified, without bleeding: Secondary | ICD-10-CM | POA: Diagnosis not present

## 2015-10-25 DIAGNOSIS — D128 Benign neoplasm of rectum: Secondary | ICD-10-CM

## 2015-10-25 DIAGNOSIS — D509 Iron deficiency anemia, unspecified: Secondary | ICD-10-CM

## 2015-10-25 DIAGNOSIS — D120 Benign neoplasm of cecum: Secondary | ICD-10-CM | POA: Diagnosis not present

## 2015-10-25 DIAGNOSIS — R195 Other fecal abnormalities: Secondary | ICD-10-CM | POA: Diagnosis not present

## 2015-10-25 DIAGNOSIS — K2990 Gastroduodenitis, unspecified, without bleeding: Secondary | ICD-10-CM | POA: Diagnosis not present

## 2015-10-25 DIAGNOSIS — K621 Rectal polyp: Secondary | ICD-10-CM | POA: Diagnosis not present

## 2015-10-25 MED ORDER — SODIUM CHLORIDE 0.9 % IV SOLN: 500.0000 mL | INTRAVENOUS | Status: DC

## 2015-10-25 NOTE — Patient Instructions (Signed)

## 2015-10-25 NOTE — Op Note (Signed)
[place]  [address] [place], [id number]   ENDOSCOPY PROCEDURE REPORT  PATIENT: Sarah Benton, Sarah Benton  MR#: [id number] BIRTHDATE: 1946-06-25 , 69  yrs. old GENDER: female ENDOSCOPIST: Ladene Artist, MD, [facility] REFERRED BY:  Breck Coons, N.P. PROCEDURE DATE:  10/25/2015 PROCEDURE:  EGD w/ biopsy ASA CLASS:     Class II INDICATIONS:  iron deficiency anemia and heme positive stool. MEDICATIONS: Monitored anesthesia care, Residual sedation present, and Propofol 150 mg IV TOPICAL ANESTHETIC: none DESCRIPTION OF PROCEDURE: After the risks benefits and alternatives of the procedure were thoroughly explained, informed consent was obtained.  The [id number] endoscope was introduced through the mouth and advanced to the second portion of the duodenum , Without limitations.  The instrument was slowly withdrawn as the mucosa was fully examined.  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  The stricture was easily traversable.   The esophagus otherwise appeared normal. STOMACH: Mild erosive gastritis  was found in the gastric body. The stomach otherwise appeared normal. 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   Mild erosive duodenitis was found in the 2nd part of the duodenum.   The duodenal mucosa showed no abnormalities in the duodenal bulb.  Cold forceps biopsies were taken in the bulb and second portion.  Retroflexed views revealed a small hiatal hernia.     The scope was then withdrawn from the patient and the procedure completed.  COMPLICATIONS: There were no immediate complications.  ENDOSCOPIC IMPRESSION: 1.   Stricture at the gastroesophageal junction 2.   Erosive gastritis in the gastric body 3.   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   Erosive duodenitis in the 2nd part of the duodenum 5.   Small hiatal  hernia  RECOMMENDATIONS: 1.  Anti-reflux regimen 2.  Await pathology results 3.  Minimize/avoid ASA/NSAIDs 4.  Continue PPI daily long term and continue Fe replacement. If iron deficiency is not easily corrected or recurs consider capsule endoscopy  eSigned:  Ladene Artist, MD, [facility] 10/25/2015 2:35 PM

## 2015-10-25 NOTE — Progress Notes (Signed)
Called to room to assist during endoscopic procedure.  Patient ID and intended procedure confirmed with present staff. Received instructions for my participation in the procedure from the performing physician.  

## 2015-10-25 NOTE — Progress Notes (Signed)
A/ox3, pleased with MAC, report to RN 

## 2015-10-25 NOTE — Op Note (Signed)
[place]  [address]. [place], [id number]   COLONOSCOPY PROCEDURE REPORT  PATIENT: Sarah Benton, Sarah Benton  MR#: [id number] BIRTHDATE: 07-Nov-1945 , 69  yrs. old GENDER: female ENDOSCOPIST: Ladene Artist, MD, [facility] REFERRED BY: Breck Coons, N.P. PROCEDURE DATE:  10/25/2015 PROCEDURE:   Colonoscopy with snare polypectomy First Screening Colonoscopy - Avg.  risk and is 50 yrs.  old or older - No.  Prior Negative Screening - Now for repeat screening. N/A  History of Adenoma - Now for follow-up colonoscopy & has been > or = to 3 yrs.  N/A  Polyps removed today? Yes ASA CLASS:   Class II INDICATIONS:Unexplained iron deficiency anemia, Evaluation of unexplained GI bleeding-heme-positive stool. MEDICATIONS: Monitored anesthesia care and Propofol 250 mg IV DESCRIPTION OF PROCEDURE:   After the risks benefits and alternatives of the procedure were thoroughly explained, informed consent was obtained.  The digital rectal exam revealed no abnormalities of the rectum.   The LB [id number] [id number] endoscope was introduced through the anus and advanced to the cecum, which was identified by both the appendix and ileocecal valve. No adverse events experienced.   The quality of the prep was excellent.  (Suprep was used)  The instrument was then slowly withdrawn as the colon was fully examined. Estimated blood loss is zero unless otherwise noted in this procedure report.    COLON FINDINGS: A sessile polyp measuring 6 mm in size was found at the appendiceal orifice.  A polypectomy was performed with a cold snare.  The site was friable post polypectomy. The resection was complete, the polyp tissue was completely retrieved and sent to histology.   A sessile polyp measuring 6 mm in size was found in the rectum.  A polypectomy was performed with a cold snare.  The resection was complete, the polyp tissue was completely retrieved and sent to histology.   The examination was  otherwise normal. Retroflexed views revealed no abnormalities. The time to cecum = 3.1 Withdrawal time = 9.2   The scope was withdrawn and the procedure completed. COMPLICATIONS: There were no immediate complications.  ENDOSCOPIC IMPRESSION: 1.   Sessile polyp at the appendiceal orifice; polypectomy performed with a cold snare 2.   Sessile polyp in the rectum; polypectomy performed with a cold snare 3.   The examination was otherwise normal  RECOMMENDATIONS: 1.  Await pathology results 2.  Repeat colonoscopy in 5 years if polyp adenomatous; otherwise given your age, you will not need another colonoscopy for colon cancer screening or polyp surveillance.  These types of tests usually stop around the age 33.  eSigned:  Ladene Artist, MD, [facility] 10/25/2015 2:27 PM

## 2015-10-26 ENCOUNTER — Telehealth: Payer: Self-pay | Admitting: *Deleted

## 2015-10-26 NOTE — Telephone Encounter (Signed)
  Follow up Call-  Call back number 10/25/2015  Post procedure Call Back phone  # [phone]  Permission to leave phone message Yes     Patient questions:  Do you have a fever, pain , or abdominal swelling? No. Pain Score  0 *  Have you tolerated food without any problems? Yes.    Have you been able to return to your normal activities? Yes.    Do you have any questions about your discharge instructions: Diet   No. Medications  No. Follow up visit  No.  Do you have questions or concerns about your Care? No.  Actions: * If pain score is 4 or above: No action needed, pain <4.

## 2015-10-31 ENCOUNTER — Encounter: Payer: Self-pay | Admitting: Gastroenterology

## 2015-10-31 ENCOUNTER — Encounter: Payer: Self-pay | Admitting: Nurse Practitioner

## 2015-10-31 ENCOUNTER — Ambulatory Visit (INDEPENDENT_AMBULATORY_CARE_PROVIDER_SITE_OTHER): Payer: Medicare Other | Admitting: Nurse Practitioner

## 2015-10-31 VITALS — BP 140/90 | HR 68 | Temp 97.6°F | Ht 63.0 in | Wt 228.0 lb

## 2015-10-31 DIAGNOSIS — E782 Mixed hyperlipidemia: Secondary | ICD-10-CM | POA: Diagnosis not present

## 2015-10-31 DIAGNOSIS — I10 Essential (primary) hypertension: Secondary | ICD-10-CM | POA: Diagnosis not present

## 2015-10-31 DIAGNOSIS — E538 Deficiency of other specified B group vitamins: Secondary | ICD-10-CM | POA: Diagnosis not present

## 2015-10-31 DIAGNOSIS — K219 Gastro-esophageal reflux disease without esophagitis: Secondary | ICD-10-CM | POA: Diagnosis not present

## 2015-10-31 DIAGNOSIS — D649 Anemia, unspecified: Secondary | ICD-10-CM | POA: Diagnosis not present

## 2015-10-31 LAB — POCT HEMOGLOBIN: HEMOGLOBIN: 12.3 g/dL (ref 12.2–16.2)

## 2015-10-31 MED ORDER — OMEPRAZOLE 20 MG PO CPDR: 20.0000 mg | DELAYED_RELEASE_CAPSULE | Freq: Every day | ORAL | Status: DC

## 2015-10-31 MED ORDER — LISINOPRIL-HYDROCHLOROTHIAZIDE 20-12.5 MG PO TABS: 1.0000 | ORAL_TABLET | Freq: Every day | ORAL | Status: DC

## 2015-10-31 MED ORDER — ROSUVASTATIN CALCIUM 20 MG PO TABS: 20.0000 mg | ORAL_TABLET | Freq: Every day | ORAL | Status: DC

## 2015-10-31 MED ORDER — FERROUS SULFATE 325 (65 FE) MG PO TABS: 325.0000 mg | ORAL_TABLET | Freq: Two times a day (BID) | ORAL | Status: DC

## 2015-10-31 NOTE — Patient Instructions (Signed)
Anemia, Nonspecific Anemia is a condition in which the concentration of red blood cells or hemoglobin in the blood is below normal. Hemoglobin is a substance in red blood cells that carries oxygen to the tissues of the body. Anemia results in not enough oxygen reaching these tissues.  CAUSES  Common causes of anemia include:   Excessive bleeding. Bleeding may be internal or external. This includes excessive bleeding from periods (in women) or from the intestine.   Poor nutrition.   Chronic kidney, thyroid, and liver disease.  Bone marrow disorders that decrease red blood cell production.  Cancer and treatments for cancer.  HIV, AIDS, and their treatments.  Spleen problems that increase red blood cell destruction.  Blood disorders.  Excess destruction of red blood cells due to infection, medicines, and autoimmune disorders. SIGNS AND SYMPTOMS   Minor weakness.   Dizziness.   Headache.  Palpitations.   Shortness of breath, especially with exercise.   Paleness.  Cold sensitivity.  Indigestion.  Nausea.  Difficulty sleeping.  Difficulty concentrating. Symptoms may occur suddenly or they may develop slowly.  DIAGNOSIS  Additional blood tests are often needed. These help your health care provider determine the best treatment. Your health care provider will check your stool for blood and look for other causes of blood loss.  TREATMENT  Treatment varies depending on the cause of the anemia. Treatment can include:   Supplements of iron, vitamin B12, or folic acid.   Hormone medicines.   A blood transfusion. This may be needed if blood loss is severe.   Hospitalization. This may be needed if there is significant continual blood loss.   Dietary changes.  Spleen removal. HOME CARE INSTRUCTIONS Keep all follow-up appointments. It often takes many weeks to correct anemia, and having your health care provider check on your condition and your response to  treatment is very important. SEEK IMMEDIATE MEDICAL CARE IF:   You develop extreme weakness, shortness of breath, or chest pain.   You become dizzy or have trouble concentrating.  You develop heavy vaginal bleeding.   You develop a rash.   You have bloody or black, tarry stools.   You faint.   You vomit up blood.   You vomit repeatedly.   You have abdominal pain.  You have a fever or persistent symptoms for more than 2-3 days.   You have a fever and your symptoms suddenly get worse.   You are dehydrated.  MAKE SURE YOU:  Understand these instructions.  Will watch your condition.  Will get help right away if you are not doing well or get worse.   This information is not intended to replace advice given to you by your health care provider. Make sure you discuss any questions you have with your health care provider.   Document Released: 10/17/2004 Document Revised: 05/12/2013 Document Reviewed: 03/05/2013 Elsevier Interactive Patient Education 2016 [employer]  

## 2015-10-31 NOTE — Progress Notes (Signed)
Subjective:    Patient ID: Sarah Benton, female    DOB: [birth year], 70 y.o.   MRN: [id number]  HPI   Patient here today for follow up of chronic medical problems.  Outpatient Encounter Prescriptions as of 10/31/2015  Medication Sig  . Cholecalciferol (VITAMIN D3) 2000 UNITS capsule Take 2,000 Units by mouth daily.    . ferrous sulfate 325 (65 FE) MG tablet Take 1 tablet (325 mg total) by mouth 2 (two) times daily with a meal.  . lisinopril-hydrochlorothiazide (PRINZIDE,ZESTORETIC) 20-12.5 MG tablet Take 1 tablet by mouth daily.  . meloxicam (MOBIC) 15 MG tablet Take 1 tablet (15 mg total) by mouth daily.  Marland Kitchen omeprazole (PRILOSEC) 20 MG capsule Take 1 capsule (20 mg total) by mouth daily.  . rosuvastatin (CRESTOR) 20 MG tablet Take 1 tablet (20 mg total) by mouth daily.   No facility-administered encounter medications on file as of 10/31/2015.   * she has been having problems with anemia- has had colonoscopy last week which 2 polyps were found and were sent to pathology- results not back yet.- he suggested that she get her labs repeated. SHe says that she feels much better today then she has  Hypertension This is a chronic problem. The current episode started more than 1 year ago. The problem is controlled. Pertinent negatives include no chest pain, headaches, neck pain, palpitations or shortness of breath. Risk factors for coronary artery disease include sedentary lifestyle, dyslipidemia, obesity and post-menopausal state. Past treatments include ACE inhibitors and diuretics. The current treatment provides significant improvement. Compliance problems include diet and exercise.  There is no history of CAD/MI.  Hyperlipidemia This is a chronic problem. The current episode started more than 1 year ago. Recent lipid tests were reviewed and are variable. Factors aggravating her hyperlipidemia include thiazides. Pertinent negatives include no chest pain or shortness of breath. Current  antihyperlipidemic treatment includes statins. The current treatment provides moderate improvement of lipids. Compliance problems include adherence to diet and adherence to exercise.  Risk factors for coronary artery disease include dyslipidemia, hypertension, obesity and post-menopausal.  GERD Is currentty on omeprazole 20 mg daily which keeps syptoms under control anemia Is currently on ferrous sulfate which is not bothering her.       Review of Systems  Constitutional: Negative.   HENT: Negative.   Respiratory: Negative.   Cardiovascular: Negative.   Gastrointestinal: Negative.   Genitourinary: Negative.   Neurological: Negative.   Psychiatric/Behavioral: Negative.   All other systems reviewed and are negative.      Objective:   Physical Exam  Constitutional: She is oriented to person, place, and time. She appears well-developed and well-nourished.  HENT:  Nose: Nose normal.  Mouth/Throat: Oropharynx is clear and moist.  Eyes: EOM are normal.  Neck: Trachea normal, normal range of motion and full passive range of motion without pain. Neck supple. No JVD present. Carotid bruit is not present. No thyromegaly present.  Cardiovascular: Normal rate, regular rhythm, normal heart sounds and intact distal pulses.  Exam reveals no gallop and no friction rub.   No murmur heard. Pulmonary/Chest: Effort normal and breath sounds normal.  Abdominal: Soft. Bowel sounds are normal. She exhibits no distension and no mass. There is no tenderness.  Musculoskeletal: Normal range of motion.  Lymphadenopathy:    She has no cervical adenopathy.  Neurological: She is alert and oriented to person, place, and time. She has normal reflexes.  Skin: Skin is warm and dry.  Psychiatric: She has a normal  mood and affect. Her behavior is normal. Judgment and thought content normal.   BP 140/90 mmHg  Pulse 68  Temp(Src) 97.6 F (36.4 C) (Oral)  Ht 5\' 3"  (1.6 m)  Wt 228 lb (103.42 kg)  BMI 40.40  kg/m2        Assessment & Plan:  1. B12 deficiency - Vitamin B12  2. Essential hypertension, benign Do not add salt to diet - lisinopril-hydrochlorothiazide (PRINZIDE,ZESTORETIC) 20-12.5 MG tablet; Take 1 tablet by mouth daily.  Dispense: 90 tablet; Refill: 1  3. Gastroesophageal reflux disease without esophagitis Avoid spicy foods Do not eat 2 hours prior to bedtime  4. Mixed hyperlipidemia Low fat diet - rosuvastatin (CRESTOR) 20 MG tablet; Take 1 tablet (20 mg total) by mouth daily.  Dispense: 90 tablet; Refill: 1  5. Morbid obesity, unspecified obesity type ([place])  6. Normocytic anemia - POCT hemoglobin - Anemia Profile B - ferrous sulfate 325 (65 FE) MG tablet; Take 1 tablet (325 mg total) by mouth 2 (two) times daily with a meal.  Dispense: 60 tablet; Refill: 5  7. Gastroesophageal reflux disease, esophagitis presence not specified Avoid spicy foods Do not eat 2 hours prior to bedtime  - omeprazole (PRILOSEC) 20 MG capsule; Take 1 capsule (20 mg total) by mouth daily.  Dispense: 30 capsule; Refill: 5    Labs pending Health maintenance reviewed Diet and exercise encouraged Continue all meds Follow up  In 3 months   [place], FNP

## 2015-11-01 LAB — ANEMIA PROFILE B
BASOS ABS: 0 10*3/uL (ref 0.0–0.2)
Basos: 0 %
EOS (ABSOLUTE): 0.2 10*3/uL (ref 0.0–0.4)
Eos: 2 %
FOLATE: 7 ng/mL (ref >3.0)
Ferritin: 124 ng/mL (ref 15–150)
Hematocrit: 37.6 % (ref 34.0–46.6)
Hemoglobin: 12.5 g/dL (ref 11.1–15.9)
Immature Grans (Abs): 0 10*3/uL (ref 0.0–0.1)
Immature Granulocytes: 0 %
Iron Saturation: 16 % (ref 15–55)
Iron: 48 ug/dL (ref 27–139)
LYMPHS ABS: 1.7 10*3/uL (ref 0.7–3.1)
Lymphs: 17 %
MCH: 28.2 pg (ref 26.6–33.0)
MCHC: 33.2 g/dL (ref 31.5–35.7)
MCV: 85 fL (ref 79–97)
Monocytes Absolute: 0.7 10*3/uL (ref 0.1–0.9)
Monocytes: 6 %
NEUTROS ABS: 7.7 10*3/uL — AB (ref 1.4–7.0)
NEUTROS PCT: 75 %
PLATELETS: 242 10*3/uL (ref 150–379)
RBC: 4.44 x10E6/uL (ref 3.77–5.28)
RDW: 14 % (ref 12.3–15.4)
Retic Ct Pct: 1.4 % (ref 0.6–2.6)
Total Iron Binding Capacity: 308 ug/dL (ref 250–450)
UIBC: 260 ug/dL (ref 118–369)
VITAMIN B 12: 297 pg/mL (ref 211–946)
WBC: 10.3 10*3/uL (ref 3.4–10.8)

## 2015-11-13 ENCOUNTER — Encounter: Payer: Self-pay | Admitting: Nurse Practitioner

## 2015-11-27 ENCOUNTER — Other Ambulatory Visit: Payer: Self-pay | Admitting: Nurse Practitioner

## 2015-11-27 NOTE — Telephone Encounter (Signed)
Seen in 10/31/15 - not sure if you want to cont this med??

## 2016-01-08 ENCOUNTER — Other Ambulatory Visit: Payer: Self-pay | Admitting: Nurse Practitioner

## 2016-02-06 ENCOUNTER — Ambulatory Visit (INDEPENDENT_AMBULATORY_CARE_PROVIDER_SITE_OTHER): Payer: Medicare Other | Admitting: Nurse Practitioner

## 2016-02-06 ENCOUNTER — Encounter: Payer: Self-pay | Admitting: Nurse Practitioner

## 2016-02-06 VITALS — BP 121/70 | HR 73 | Temp 97.0°F | Ht 63.0 in | Wt 228.0 lb

## 2016-02-06 DIAGNOSIS — I10 Essential (primary) hypertension: Secondary | ICD-10-CM | POA: Diagnosis not present

## 2016-02-06 DIAGNOSIS — E782 Mixed hyperlipidemia: Secondary | ICD-10-CM | POA: Diagnosis not present

## 2016-02-06 DIAGNOSIS — R601 Generalized edema: Secondary | ICD-10-CM

## 2016-02-06 DIAGNOSIS — E559 Vitamin D deficiency, unspecified: Secondary | ICD-10-CM | POA: Diagnosis not present

## 2016-02-06 DIAGNOSIS — D649 Anemia, unspecified: Secondary | ICD-10-CM

## 2016-02-06 DIAGNOSIS — K219 Gastro-esophageal reflux disease without esophagitis: Secondary | ICD-10-CM | POA: Diagnosis not present

## 2016-02-06 MED ORDER — FERROUS SULFATE 325 (65 FE) MG PO TABS: 325.0000 mg | ORAL_TABLET | Freq: Two times a day (BID) | ORAL | Status: DC

## 2016-02-06 MED ORDER — ROSUVASTATIN CALCIUM 20 MG PO TABS: 20.0000 mg | ORAL_TABLET | Freq: Every day | ORAL | Status: DC

## 2016-02-06 MED ORDER — OMEPRAZOLE 20 MG PO CPDR: 20.0000 mg | DELAYED_RELEASE_CAPSULE | Freq: Every day | ORAL | Status: DC

## 2016-02-06 MED ORDER — LISINOPRIL-HYDROCHLOROTHIAZIDE 20-12.5 MG PO TABS: 1.0000 | ORAL_TABLET | Freq: Every day | ORAL | Status: DC

## 2016-02-06 NOTE — Patient Instructions (Signed)
Health Maintenance, Female Adopting a healthy lifestyle and getting preventive care can go a long way to promote health and wellness. Talk with your health care provider about what schedule of regular examinations is right for you. This is a good chance for you to check in with your provider about disease prevention and staying healthy. In between checkups, there are plenty of things you can do on your own. Experts have done a lot of research about which lifestyle changes and preventive measures are most likely to keep you healthy. Ask your health care provider for more information. WEIGHT AND DIET  Eat a healthy diet  Be sure to include plenty of vegetables, fruits, low-fat dairy products, and lean protein.  Do not eat a lot of foods high in solid fats, added sugars, or salt.  Get regular exercise. This is one of the most important things you can do for your health.  Most adults should exercise for at least 150 minutes each week. The exercise should increase your heart rate and make you sweat (moderate-intensity exercise).  Most adults should also do strengthening exercises at least twice a week. This is in addition to the moderate-intensity exercise.  Maintain a healthy weight  Body mass index (BMI) is a measurement that can be used to identify possible weight problems. It estimates body fat based on height and weight. Your health care provider can help determine your BMI and help you achieve or maintain a healthy weight.  For females 20 years of age and older:   A BMI below 18.5 is considered underweight.  A BMI of 18.5 to 24.9 is normal.  A BMI of 25 to 29.9 is considered overweight.  A BMI of 30 and above is considered obese.  Watch levels of cholesterol and blood lipids  You should start having your blood tested for lipids and cholesterol at 72 years of age, then have this test every 5 years.  You may need to have your cholesterol levels checked more often if:  Your lipid  or cholesterol levels are high.  You are older than 72 years of age.  You are at high risk for heart disease.  CANCER SCREENING   Lung Cancer  Lung cancer screening is recommended for adults 55-80 years old who are at high risk for lung cancer because of a history of smoking.  A yearly low-dose CT scan of the lungs is recommended for people who:  Currently smoke.  Have quit within the past 15 years.  Have at least a 30-pack-year history of smoking. A pack year is smoking an average of one pack of cigarettes a day for 1 year.  Yearly screening should continue until it has been 15 years since you quit.  Yearly screening should stop if you develop a health problem that would prevent you from having lung cancer treatment.  Breast Cancer  Practice breast self-awareness. This means understanding how your breasts normally appear and feel.  It also means doing regular breast self-exams. Let your health care provider know about any changes, no matter how small.  If you are in your 20s or 30s, you should have a clinical breast exam (CBE) by a health care provider every 1-3 years as part of a regular health exam.  If you are 40 or older, have a CBE every year. Also consider having a breast X-ray (mammogram) every year.  If you have a family history of breast cancer, talk to your health care provider about genetic screening.  If you   are at high risk for breast cancer, talk to your health care provider about having an MRI and a mammogram every year.  Breast cancer gene (BRCA) assessment is recommended for women who have family members with BRCA-related cancers. BRCA-related cancers include:  Breast.  Ovarian.  Tubal.  Peritoneal cancers.  Results of the assessment will determine the need for genetic counseling and BRCA1 and BRCA2 testing. Cervical Cancer Your health care provider may recommend that you be screened regularly for cancer of the pelvic organs (ovaries, uterus, and  vagina). This screening involves a pelvic examination, including checking for microscopic changes to the surface of your cervix (Pap test). You may be encouraged to have this screening done every 3 years, beginning at age 21.  For women ages 30-65, health care providers may recommend pelvic exams and Pap testing every 3 years, or they may recommend the Pap and pelvic exam, combined with testing for human papilloma virus (HPV), every 5 years. Some types of HPV increase your risk of cervical cancer. Testing for HPV may also be done on women of any age with unclear Pap test results.  Other health care providers may not recommend any screening for nonpregnant women who are considered low risk for pelvic cancer and who do not have symptoms. Ask your health care provider if a screening pelvic exam is right for you.  If you have had past treatment for cervical cancer or a condition that could lead to cancer, you need Pap tests and screening for cancer for at least 20 years after your treatment. If Pap tests have been discontinued, your risk factors (such as having a new sexual partner) need to be reassessed to determine if screening should resume. Some women have medical problems that increase the chance of getting cervical cancer. In these cases, your health care provider may recommend more frequent screening and Pap tests. Colorectal Cancer  This type of cancer can be detected and often prevented.  Routine colorectal cancer screening usually begins at 72 years of age and continues through 72 years of age.  Your health care provider may recommend screening at an earlier age if you have risk factors for colon cancer.  Your health care provider may also recommend using home test kits to check for hidden blood in the stool.  A small camera at the end of a tube can be used to examine your colon directly (sigmoidoscopy or colonoscopy). This is done to check for the earliest forms of colorectal  cancer.  Routine screening usually begins at age 50.  Direct examination of the colon should be repeated every 5-10 years through 72 years of age. However, you may need to be screened more often if early forms of precancerous polyps or small growths are found. Skin Cancer  Check your skin from head to toe regularly.  Tell your health care provider about any new moles or changes in moles, especially if there is a change in a mole's shape or color.  Also tell your health care provider if you have a mole that is larger than the size of a pencil eraser.  Always use sunscreen. Apply sunscreen liberally and repeatedly throughout the day.  Protect yourself by wearing long sleeves, pants, a wide-brimmed hat, and sunglasses whenever you are outside. HEART DISEASE, DIABETES, AND HIGH BLOOD PRESSURE   High blood pressure causes heart disease and increases the risk of stroke. High blood pressure is more likely to develop in:  People who have blood pressure in the high end   of the normal range (130-139/85-89 mm Hg).  People who are overweight or obese.  People who are African American.  If you are 38-23 years of age, have your blood pressure checked every 3-5 years. If you are 61 years of age or older, have your blood pressure checked every year. You should have your blood pressure measured twice--once when you are at a hospital or clinic, and once when you are not at a hospital or clinic. Record the average of the two measurements. To check your blood pressure when you are not at a hospital or clinic, you can use:  An automated blood pressure machine at a pharmacy.  A home blood pressure monitor.  If you are between 45 years and 39 years old, ask your health care provider if you should take aspirin to prevent strokes.  Have regular diabetes screenings. This involves taking a blood sample to check your fasting blood sugar level.  If you are at a normal weight and have a low risk for diabetes,  have this test once every three years after 72 years of age.  If you are overweight and have a high risk for diabetes, consider being tested at a younger age or more often. PREVENTING INFECTION  Hepatitis B  If you have a higher risk for hepatitis B, you should be screened for this virus. You are considered at high risk for hepatitis B if:  You were born in a country where hepatitis B is common. Ask your health care provider which countries are considered high risk.  Your parents were born in a high-risk country, and you have not been immunized against hepatitis B (hepatitis B vaccine).  You have HIV or AIDS.  You use needles to inject street drugs.  You live with someone who has hepatitis B.  You have had sex with someone who has hepatitis B.  You get hemodialysis treatment.  You take certain medicines for conditions, including cancer, organ transplantation, and autoimmune conditions. Hepatitis C  Blood testing is recommended for:  Everyone born from [birth year] through 1967.  Anyone with known risk factors for hepatitis C. Sexually transmitted infections (STIs)  You should be screened for sexually transmitted infections (STIs) including gonorrhea and chlamydia if:  You are sexually active and are younger than 72 years of age.  You are older than 72 years of age and your health care provider tells you that you are at risk for this type of infection.  Your sexual activity has changed since you were last screened and you are at an increased risk for chlamydia or gonorrhea. Ask your health care provider if you are at risk.  If you do not have HIV, but are at risk, it may be recommended that you take a prescription medicine daily to prevent HIV infection. This is called pre-exposure prophylaxis (PrEP). You are considered at risk if:  You are sexually active and do not regularly use condoms or know the HIV status of your partner(s).  You take drugs by injection.  You are sexually  active with a partner who has HIV. Talk with your health care provider about whether you are at high risk of being infected with HIV. If you choose to begin PrEP, you should first be tested for HIV. You should then be tested every 3 months for as long as you are taking PrEP.  PREGNANCY   If you are premenopausal and you may become pregnant, ask your health care provider about preconception counseling.  If you may  become pregnant, take 400 to 800 micrograms (mcg) of folic acid every day.  If you want to prevent pregnancy, talk to your health care provider about birth control (contraception). OSTEOPOROSIS AND MENOPAUSE   Osteoporosis is a disease in which the bones lose minerals and strength with aging. This can result in serious bone fractures. Your risk for osteoporosis can be identified using a bone density scan.  If you are 61 years of age or older, or if you are at risk for osteoporosis and fractures, ask your health care provider if you should be screened.  Ask your health care provider whether you should take a calcium or vitamin D supplement to lower your risk for osteoporosis.  Menopause may have certain physical symptoms and risks.  Hormone replacement therapy may reduce some of these symptoms and risks. Talk to your health care provider about whether hormone replacement therapy is right for you.  HOME CARE INSTRUCTIONS   Schedule regular health, dental, and eye exams.  Stay current with your immunizations.   Do not use any tobacco products including cigarettes, chewing tobacco, or electronic cigarettes.  If you are pregnant, do not drink alcohol.  If you are breastfeeding, limit how much and how often you drink alcohol.  Limit alcohol intake to no more than 1 drink per day for nonpregnant women. One drink equals 12 ounces of beer, 5 ounces of wine, or 1 ounces of hard liquor.  Do not use street drugs.  Do not share needles.  Ask your health care provider for help if  you need support or information about quitting drugs.  Tell your health care provider if you often feel depressed.  Tell your health care provider if you have ever been abused or do not feel safe at home.   This information is not intended to replace advice given to you by your health care provider. Make sure you discuss any questions you have with your health care provider.   Document Released: 03/25/2011 Document Revised: 09/30/2014 Document Reviewed: 08/11/2013 Elsevier Interactive Patient Education [employer].

## 2016-02-06 NOTE — Progress Notes (Signed)
Subjective:    Patient ID: Sarah Benton, female    DOB: 12/13/1945, 70 y.o.   MRN: [id number]  HPI    Patient here today for follow up of chronic medical problems.  Outpatient Encounter Prescriptions as of 02/06/2016  Medication Sig  . Cholecalciferol (VITAMIN D3) 2000 UNITS capsule Take 2,000 Units by mouth daily.    . ferrous sulfate 325 (65 FE) MG tablet Take 1 tablet (325 mg total) by mouth 2 (two) times daily with a meal.  . lisinopril-hydrochlorothiazide (PRINZIDE,ZESTORETIC) 20-12.5 MG tablet Take 1 tablet by mouth daily.  . meloxicam (MOBIC) 15 MG tablet Take 1 tablet (15 mg total) by mouth daily.  Marland Kitchen omeprazole (PRILOSEC) 20 MG capsule Take 1 capsule (20 mg total) by mouth daily.  . rosuvastatin (CRESTOR) 20 MG tablet Take 1 tablet (20 mg total) by mouth daily.   No facility-administered encounter medications on file as of 02/06/2016.   Hypertension This is a chronic problem. The current episode started more than 1 year ago. The problem is controlled. Pertinent negatives include no chest pain, headaches, neck pain, palpitations or shortness of breath. Risk factors for coronary artery disease include sedentary lifestyle, dyslipidemia, obesity and post-menopausal state. Past treatments include ACE inhibitors and diuretics. The current treatment provides significant improvement. Compliance problems include diet and exercise.  There is no history of CAD/MI.  Hyperlipidemia This is a chronic problem. The current episode started more than 1 year ago. Recent lipid tests were reviewed and are variable. Factors aggravating her hyperlipidemia include thiazides. Pertinent negatives include no chest pain or shortness of breath. Current antihyperlipidemic treatment includes statins. The current treatment provides moderate improvement of lipids. Compliance problems include adherence to diet and adherence to exercise.  Risk factors for coronary artery disease include dyslipidemia, hypertension,  obesity and post-menopausal.  GERD Is currentty on omeprazole 20 mg daily which keeps syptoms under control anemia Is currently on ferrous sulfate which is not bothering her. Vitamin d def Taking vitamin d 2000 IU oTC daily- no side effects     Review of Systems  Constitutional: Negative.   HENT: Negative.   Respiratory: Negative.   Cardiovascular: Negative.   Gastrointestinal: Negative.   Genitourinary: Negative.   Neurological: Negative.   Psychiatric/Behavioral: Negative.   All other systems reviewed and are negative.      Objective:   Physical Exam  Constitutional: She is oriented to person, place, and time. She appears well-developed and well-nourished.  HENT:  Nose: Nose normal.  Mouth/Throat: Oropharynx is clear and moist.  Eyes: EOM are normal.  Neck: Trachea normal, normal range of motion and full passive range of motion without pain. Neck supple. No JVD present. Carotid bruit is not present. No thyromegaly present.  Cardiovascular: Normal rate, regular rhythm, normal heart sounds and intact distal pulses.  Exam reveals no gallop and no friction rub.   No murmur heard. Pulmonary/Chest: Effort normal and breath sounds normal.  Abdominal: Soft. Bowel sounds are normal. She exhibits no distension and no mass. There is no tenderness.  Musculoskeletal: Normal range of motion.  Lymphadenopathy:    She has no cervical adenopathy.  Neurological: She is alert and oriented to person, place, and time. She has normal reflexes.  Skin: Skin is warm and dry.  Psychiatric: She has a normal mood and affect. Her behavior is normal. Judgment and thought content normal.   BP 121/70 mmHg  Pulse 73  Temp(Src) 97 F (36.1 C) (Oral)  Ht '5\' 3"'  (1.6 m)  Wt  228 lb (103.42 kg)  BMI 40.40 kg/m2       Assessment & Plan:  1. Essential hypertension, benign Do not add salt to diet - lisinopril-hydrochlorothiazide (PRINZIDE,ZESTORETIC) 20-12.5 MG tablet; Take 1 tablet by mouth daily.   Dispense: 90 tablet; Refill: 1 - CMP14+EGFR  2. Gastroesophageal reflux disease without esophagitis Avoid spicy foods Do not eat 2 hours prior to bedtime - omeprazole (PRILOSEC) 20 MG capsule; Take 1 capsule (20 mg total) by mouth daily.  Dispense: 30 capsule; Refill: 5 3. Mixed hyperlipidemia Low fat diet - rosuvastatin (CRESTOR) 20 MG tablet; Take 1 tablet (20 mg total) by mouth daily.  Dispense: 90 tablet; Refill: 1 - Lipid panel  4. Morbid obesity, unspecified obesity type ([place]) Discussed diet and exercise for person with BMI >25 Will recheck weight in 3-6 months  5. Normocytic anemia Labs pending - ferrous sulfate 325 (65 FE) MG tablet; Take 1 tablet (325 mg total) by mouth 2 (two) times daily with a meal.  Dispense: 60 tablet; Refill: 5 - Anemia Profile B  6. Generalized edema Elevate legs when sitting   7. Vitamin D deficiency - VITAMIN D 25 Hydroxy (Vit-D Deficiency, Fractures)    Labs pending Health maintenance reviewed Diet and exercise encouraged Continue all meds Follow up  In 6 months  [place], FNP

## 2016-02-07 LAB — LIPID PANEL
CHOL/HDL RATIO: 3.5 ratio (ref 0.0–4.4)
CHOLESTEROL TOTAL: 198 mg/dL (ref 100–199)
HDL: 57 mg/dL (ref >39)
LDL CALC: 110 mg/dL — AB (ref 0–99)
TRIGLYCERIDES: 156 mg/dL — AB (ref 0–149)
VLDL CHOLESTEROL CAL: 31 mg/dL (ref 5–40)

## 2016-02-07 LAB — CMP14+EGFR
ALBUMIN: 4.2 g/dL (ref 3.5–4.8)
ALK PHOS: 125 IU/L — AB (ref 39–117)
ALT: 26 IU/L (ref 0–32)
AST: 21 IU/L (ref 0–40)
Albumin/Globulin Ratio: 1.8 (ref 1.2–2.2)
BUN/Creatinine Ratio: 19 (ref 12–28)
BUN: 17 mg/dL (ref 8–27)
Bilirubin Total: 0.3 mg/dL (ref 0.0–1.2)
CALCIUM: 9.5 mg/dL (ref 8.7–10.3)
CO2: 22 mmol/L (ref 18–29)
CREATININE: 0.91 mg/dL (ref 0.57–1.00)
Chloride: 102 mmol/L (ref 96–106)
GFR calc Af Amer: 73 mL/min/{1.73_m2} (ref >59)
GFR, EST NON AFRICAN AMERICAN: 63 mL/min/{1.73_m2} (ref >59)
GLUCOSE: 97 mg/dL (ref 65–99)
Globulin, Total: 2.3 g/dL (ref 1.5–4.5)
Potassium: 4.7 mmol/L (ref 3.5–5.2)
Sodium: 144 mmol/L (ref 134–144)
Total Protein: 6.5 g/dL (ref 6.0–8.5)

## 2016-02-07 LAB — ANEMIA PROFILE B
BASOS ABS: 0 10*3/uL (ref 0.0–0.2)
Basos: 1 %
EOS (ABSOLUTE): 0.1 10*3/uL (ref 0.0–0.4)
Eos: 2 %
FOLATE: 5.9 ng/mL (ref >3.0)
Ferritin: 141 ng/mL (ref 15–150)
Hematocrit: 40 % (ref 34.0–46.6)
Hemoglobin: 12.9 g/dL (ref 11.1–15.9)
IMMATURE GRANULOCYTES: 0 %
IRON SATURATION: 28 % (ref 15–55)
Immature Grans (Abs): 0 10*3/uL (ref 0.0–0.1)
Iron: 88 ug/dL (ref 27–139)
LYMPHS ABS: 1.7 10*3/uL (ref 0.7–3.1)
Lymphs: 21 %
MCH: 28 pg (ref 26.6–33.0)
MCHC: 32.3 g/dL (ref 31.5–35.7)
MCV: 87 fL (ref 79–97)
MONOS ABS: 0.5 10*3/uL (ref 0.1–0.9)
Monocytes: 6 %
NEUTROS PCT: 70 %
Neutrophils Absolute: 5.5 10*3/uL (ref 1.4–7.0)
PLATELETS: 258 10*3/uL (ref 150–379)
RBC: 4.61 x10E6/uL (ref 3.77–5.28)
RDW: 14 % (ref 12.3–15.4)
Retic Ct Pct: 1.8 % (ref 0.6–2.6)
Total Iron Binding Capacity: 319 ug/dL (ref 250–450)
UIBC: 231 ug/dL (ref 118–369)
Vitamin B-12: 279 pg/mL (ref 211–946)
WBC: 7.9 10*3/uL (ref 3.4–10.8)

## 2016-02-07 LAB — VITAMIN D 25 HYDROXY (VIT D DEFICIENCY, FRACTURES): VIT D 25 HYDROXY: 34.7 ng/mL (ref 30.0–100.0)

## 2016-02-09 ENCOUNTER — Other Ambulatory Visit: Payer: Self-pay | Admitting: Nurse Practitioner

## 2016-04-18 ENCOUNTER — Telehealth: Payer: Self-pay | Admitting: Pharmacist

## 2016-05-21 ENCOUNTER — Encounter: Payer: Self-pay | Admitting: Pharmacist

## 2016-05-21 ENCOUNTER — Ambulatory Visit (INDEPENDENT_AMBULATORY_CARE_PROVIDER_SITE_OTHER): Payer: Medicare Other | Admitting: Pharmacist

## 2016-05-21 VITALS — BP 110/70 | HR 62 | Ht 63.0 in | Wt 226.0 lb

## 2016-05-21 DIAGNOSIS — Z0000 Encounter for general adult medical examination without abnormal findings: Secondary | ICD-10-CM

## 2016-05-21 DIAGNOSIS — D649 Anemia, unspecified: Secondary | ICD-10-CM

## 2016-05-21 DIAGNOSIS — E782 Mixed hyperlipidemia: Secondary | ICD-10-CM

## 2016-05-21 MED ORDER — MELOXICAM 15 MG PO TABS: 15.0000 mg | ORAL_TABLET | Freq: Every day | ORAL | 0 refills | Status: DC

## 2016-05-21 MED ORDER — ROSUVASTATIN CALCIUM 20 MG PO TABS: 20.0000 mg | ORAL_TABLET | ORAL | 1 refills | Status: DC

## 2016-05-21 MED ORDER — FERROUS SULFATE 325 (65 FE) MG PO TABS: 325.0000 mg | ORAL_TABLET | ORAL | 0 refills | Status: DC

## 2016-05-21 NOTE — Patient Instructions (Addendum)
Sarah Benton , Thank you for taking time to come for your Medicare Wellness Visit. I appreciate your ongoing commitment to your health goals. Please review the following plan we discussed and let me know if I can assist you in the future.   These are the goals we discussed:  Increase non-starchy vegetables - carrots, green bean, squash, zucchini, tomatoes, onions, peppers, spinach and other green leafy vegetables, cabbage, lettuce, cucumbers, asparagus, okra (not fried), eggplant Limit sugar and processed foods (cakes, cookies, ice cream, crackers and chips) Increase fresh fruit but limit serving sizes 1/2 cup or about the size of tennis or baseball Limit red meat to no more than 1-2 times per week (serving size about the size of your palm) Choose whole grains / lean proteins - whole wheat bread, quinoa, whole grain rice (1/2 cup), fish, chicken, [place] Avoid sugar and calorie containing beverages - soda, sweet tea and juice.  Choose water or unsweetened tea instead.  Increase exercise - goal is 150 minutes per week but start where you can.   Look for copy of [place] (important to know where these are kept - you can also bring copy to our office to be placed in our file / electronic chart)    This is a list of the screening recommended for you and due dates:  Health Maintenance  Topic Date Due  . Flu Shot  04/23/2016  . Mammogram  09/07/2016  . DEXA scan (bone density measurement)  01/10/2020  . Tetanus Vaccine  06/06/2021  . Shingles Vaccine  Completed  .  Hepatitis C: One time screening is recommended by Center for Disease Control  (CDC) for  adults born from [birth year] through 1965.   Completed  . Pneumonia vaccines  Completed   Health Maintenance, Female Adopting a healthy lifestyle and getting preventive care can go a long way to promote health and wellness. Talk with your health care provider about what schedule of regular  examinations is right for you. This is a good chance for you to check in with your provider about disease prevention and staying healthy. In between checkups, there are plenty of things you can do on your own. Experts have done a lot of research about which lifestyle changes and preventive measures are most likely to keep you healthy. Ask your health care provider for more information. WEIGHT AND DIET  Eat a healthy diet  Be sure to include plenty of vegetables, fruits, low-fat dairy products, and lean protein.  Do not eat a lot of foods high in solid fats, added sugars, or salt.  Get regular exercise. This is one of the most important things you can do for your health.  Most adults should exercise for at least 150 minutes each week. The exercise should increase your heart rate and make you sweat (moderate-intensity exercise).  Most adults should also do strengthening exercises at least twice a week. This is in addition to the moderate-intensity exercise.  Maintain a healthy weight  Body mass index (BMI) is a measurement that can be used to identify possible weight problems. It estimates body fat based on height and weight. Your health care provider can help determine your BMI and help you achieve or maintain a healthy weight.  For females 87 years of age and older:   A BMI below 18.5 is considered underweight.  A BMI of 18.5 to 24.9 is normal.  A BMI of 25 to 29.9 is considered overweight.  A BMI of 30 and above is considered obese.  Watch levels of cholesterol and blood lipids  You should start having your blood tested for lipids and cholesterol at 72 years of age, then have this test every 5 years.  You may need to have your cholesterol levels checked more often if:  Your lipid or cholesterol levels are high.  You are older than 72 years of age.  You are at high risk for heart disease.  CANCER SCREENING   Lung Cancer  Lung cancer screening is recommended for adults  67-53 years old who are at high risk for lung cancer because of a history of smoking.  A yearly low-dose CT scan of the lungs is recommended for people who:  Currently smoke.  Have quit within the past 15 years.  Have at least a 30-pack-year history of smoking. A pack year is smoking an average of one pack of cigarettes a day for 1 year.  Yearly screening should continue until it has been 15 years since you quit.  Yearly screening should stop if you develop a health problem that would prevent you from having lung cancer treatment.  Breast Cancer  Practice breast self-awareness. This means understanding how your breasts normally appear and feel.  It also means doing regular breast self-exams. Let your health care provider know about any changes, no matter how small.  If you are in your 20s or 30s, you should have a clinical breast exam (CBE) by a health care provider every 1-3 years as part of a regular health exam.  If you are 62 or older, have a CBE every year. Also consider having a breast X-ray (mammogram) every year.  If you have a family history of breast cancer, talk to your health care provider about genetic screening.  If you are at high risk for breast cancer, talk to your health care provider about having an MRI and a mammogram every year.  Breast cancer gene (BRCA) assessment is recommended for women who have family members with BRCA-related cancers. BRCA-related cancers include:  Breast.  Ovarian.  Tubal.  Peritoneal cancers.  Results of the assessment will determine the need for genetic counseling and BRCA1 and BRCA2 testing. Cervical Cancer Your health care provider may recommend that you be screened regularly for cancer of the pelvic organs (ovaries, uterus, and vagina). This screening involves a pelvic examination, including checking for microscopic changes to the surface of your cervix (Pap test). You may be encouraged to have this screening done every 3  years, beginning at age 28.  For women ages 87-65, health care providers may recommend pelvic exams and Pap testing every 3 years, or they may recommend the Pap and pelvic exam, combined with testing for human papilloma virus (HPV), every 5 years. Some types of HPV increase your risk of cervical cancer. Testing for HPV may also be done on women of any age with unclear Pap test results.  Other health care providers may not recommend any screening for nonpregnant women who are considered low risk for pelvic cancer and who do not have symptoms. Ask your health care provider if a screening pelvic exam is right for you.  If you have had past treatment for cervical cancer or a condition that could lead to cancer, you need Pap tests and screening for cancer for at least 20 years after your treatment. If Pap tests have been discontinued, your risk factors (such as having a new sexual partner) need to be reassessed to determine  if screening should resume. Some women have medical problems that increase the chance of getting cervical cancer. In these cases, your health care provider may recommend more frequent screening and Pap tests. Colorectal Cancer  This type of cancer can be detected and often prevented.  Routine colorectal cancer screening usually begins at 72 years of age and continues through 72 years of age.  Your health care provider may recommend screening at an earlier age if you have risk factors for colon cancer.  Your health care provider may also recommend using home test kits to check for hidden blood in the stool.  A small camera at the end of a tube can be used to examine your colon directly (sigmoidoscopy or colonoscopy). This is done to check for the earliest forms of colorectal cancer.  Routine screening usually begins at age 33.  Direct examination of the colon should be repeated every 5-10 years through 72 years of age. However, you may need to be screened more often if early forms  of precancerous polyps or small growths are found. Skin Cancer  Check your skin from head to toe regularly.  Tell your health care provider about any new moles or changes in moles, especially if there is a change in a mole's shape or color.  Also tell your health care provider if you have a mole that is larger than the size of a pencil eraser.  Always use sunscreen. Apply sunscreen liberally and repeatedly throughout the day.  Protect yourself by wearing long sleeves, pants, a wide-brimmed hat, and sunglasses whenever you are outside. HEART DISEASE, DIABETES, AND HIGH BLOOD PRESSURE   High blood pressure causes heart disease and increases the risk of stroke. High blood pressure is more likely to develop in:  People who have blood pressure in the high end of the normal range (130-139/85-89 mm Hg).  People who are overweight or obese.  People who are African American.  If you are 23-98 years of age, have your blood pressure checked every 3-5 years. If you are 71 years of age or older, have your blood pressure checked every year. You should have your blood pressure measured twice--once when you are at a hospital or clinic, and once when you are not at a hospital or clinic. Record the average of the two measurements. To check your blood pressure when you are not at a hospital or clinic, you can use:  An automated blood pressure machine at a pharmacy.  A home blood pressure monitor.  If you are between 26 years and 92 years old, ask your health care provider if you should take aspirin to prevent strokes.  Have regular diabetes screenings. This involves taking a blood sample to check your fasting blood sugar level.  If you are at a normal weight and have a low risk for diabetes, have this test once every three years after 72 years of age.  If you are overweight and have a high risk for diabetes, consider being tested at a younger age or more often. PREVENTING INFECTION  Hepatitis  B  If you have a higher risk for hepatitis B, you should be screened for this virus. You are considered at high risk for hepatitis B if:  You were born in a country where hepatitis B is common. Ask your health care provider which countries are considered high risk.  Your parents were born in a high-risk country, and you have not been immunized against hepatitis B (hepatitis B vaccine).  You have  HIV or AIDS.  You use needles to inject street drugs.  You live with someone who has hepatitis B.  You have had sex with someone who has hepatitis B.  You get hemodialysis treatment.  You take certain medicines for conditions, including cancer, organ transplantation, and autoimmune conditions. Hepatitis C  Blood testing is recommended for:  Everyone born from [birth year] through 1967.  Anyone with known risk factors for hepatitis C. Sexually transmitted infections (STIs)  You should be screened for sexually transmitted infections (STIs) including gonorrhea and chlamydia if:  You are sexually active and are younger than 72 years of age.  You are older than 72 years of age and your health care provider tells you that you are at risk for this type of infection.  Your sexual activity has changed since you were last screened and you are at an increased risk for chlamydia or gonorrhea. Ask your health care provider if you are at risk.  If you do not have HIV, but are at risk, it may be recommended that you take a prescription medicine daily to prevent HIV infection. This is called pre-exposure prophylaxis (PrEP). You are considered at risk if:  You are sexually active and do not regularly use condoms or know the HIV status of your partner(s).  You take drugs by injection.  You are sexually active with a partner who has HIV. Talk with your health care provider about whether you are at high risk of being infected with HIV. If you choose to begin PrEP, you should first be tested for HIV. You should  then be tested every 3 months for as long as you are taking PrEP.  PREGNANCY   If you are premenopausal and you may become pregnant, ask your health care provider about preconception counseling.  If you may become pregnant, take 400 to 800 micrograms (mcg) of folic acid every day.  If you want to prevent pregnancy, talk to your health care provider about birth control (contraception). OSTEOPOROSIS AND MENOPAUSE   Osteoporosis is a disease in which the bones lose minerals and strength with aging. This can result in serious bone fractures. Your risk for osteoporosis can be identified using a bone density scan.  If you are 41 years of age or older, or if you are at risk for osteoporosis and fractures, ask your health care provider if you should be screened.  Ask your health care provider whether you should take a calcium or vitamin D supplement to lower your risk for osteoporosis.  Menopause may have certain physical symptoms and risks.  Hormone replacement therapy may reduce some of these symptoms and risks. Talk to your health care provider about whether hormone replacement therapy is right for you.  HOME CARE INSTRUCTIONS   Schedule regular health, dental, and eye exams.  Stay current with your immunizations.   Do not use any tobacco products including cigarettes, chewing tobacco, or electronic cigarettes.  If you are pregnant, do not drink alcohol.  If you are breastfeeding, limit how much and how often you drink alcohol.  Limit alcohol intake to no more than 1 drink per day for nonpregnant women. One drink equals 12 ounces of beer, 5 ounces of wine, or 1 ounces of hard liquor.  Do not use street drugs.  Do not share needles.  Ask your health care provider for help if you need support or information about quitting drugs.  Tell your health care provider if you often feel depressed.  Tell your health care  provider if you have ever been abused or do not feel safe at home.    This information is not intended to replace advice given to you by your health care provider. Make sure you discuss any questions you have with your health care provider.   Document Released: 03/25/2011 Document Revised: 09/30/2014 Document Reviewed: 08/11/2013 Elsevier Interactive Patient Education [employer].

## 2016-05-21 NOTE — Progress Notes (Signed)
Patient ID: Sarah Benton, female   DOB: [birth year], 70 y.o.   MRN: [id number]    Subjective:   Sarah Benton is a 70 y.o. female who presents for an Initial Medicare Annual Wellness Visit.  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  She spends time taking care of her husband who has vascular dementia and lives at [facility] for the last 19 months.  She also helps take care of and provide transportation for her 1 year old granddaughter with special need.  She asks about continuing to take iron supplement.  She states that her iron was never very low and that she is only taking iron supplement once daily instead of twice daily as Rx indicates.  She wonders if she might stop.   Review of Systems  Review of Systems  Constitutional: Negative.   HENT: Negative.   Eyes: Negative.   Respiratory: Negative.   Cardiovascular: Negative.   Gastrointestinal: Negative.   Genitourinary: Negative.   Musculoskeletal: Positive for myalgias (possible related to statin).  Skin: Negative.   Neurological: Negative.   Endo/Heme/Allergies: Negative.   Psychiatric/Behavioral: Negative.      Current Medications (verified) Outpatient Encounter Prescriptions as of 05/21/2016  Medication Sig  . Cholecalciferol (VITAMIN D3) 2000 UNITS capsule Take 2,000 Units by mouth daily.    Marland Kitchen lisinopril-hydrochlorothiazide (PRINZIDE,ZESTORETIC) 20-12.5 MG tablet Take 1 tablet by mouth daily.  . meloxicam (MOBIC) 15 MG tablet Take 1 tablet (15 mg total) by mouth daily.  Marland Kitchen omeprazole (PRILOSEC) 20 MG capsule Take 1 capsule (20 mg total) by mouth daily.  . rosuvastatin (CRESTOR) 20 MG tablet Take 1 tablet (20 mg total) by mouth daily. (Patient taking differently: Take 20 mg by mouth every Monday, Wednesday, and Friday. )  . ferrous sulfate 325 (65 FE) MG tablet Take 1 tablet (325 mg total) by mouth 2 (two) times daily with a meal. (Patient taking differently: Take  325 mg by mouth daily with breakfast. )   No facility-administered encounter medications on file as of 05/21/2016.     Allergies (verified) Amoxicillin; Septra [sulfamethoxazole-trimethoprim]; and Atorvastatin   History: Past Medical History:  Diagnosis Date  . Anemia    Iron def  . Arthritis   . Cataract   . Edema   . Esophageal stricture   . GERD (gastroesophageal reflux disease)   . Hyperlipidemia   . Hypertension   . Obesity   . Ruptured lumbar disc    Past Surgical History:  Procedure Laterality Date  . ABDOMINAL HYSTERECTOMY Bilateral 2001   complete  . BREAST BIOPSY  1993   Left/negative  . LUMBAR LAMINECTOMY  1998   ruptured disc  . TONSILLECTOMY    . TUBAL LIGATION  1979   Family History  Problem Relation Age of Onset  . Parkinsonism Mother 67  . Ovarian cancer Mother   . Hypertension Father   . Prostate cancer Father 89    met to bone / hip  . Arthritis Sister     knee  . Diabetes Son     type 2  . Colon cancer Neg Hx    Social History   Occupational History  . Retired     Conservation officer, historic buildings   Social History Main Topics  . Smoking status: Never Smoker  . Smokeless tobacco: Never Used  . Alcohol use No  . Drug use: No  . Sexual activity: No    Do you feel safe at home?  Yes Are there smokers in your home (other than you)? No  Dietary issues and exercise activities: Current Exercise Habits: The patient does not participate in regular exercise at present  Current Dietary habits:  Tried to limit CHO and sugar intake as she has a history of slightly elevated triglycerides.  She also tries to limit fried foods.  Objective:    Today's Vitals   05/21/16 1141  BP: 110/70  Pulse: 62  Weight: 226 lb (102.5 kg)  Height: _0  (1.6 m)  PainSc: 0-No pain   Body mass index is 40.03 kg/m.  Activities of Daily Living In your present state of health, do you have any difficulty performing the following activities: 05/21/2016  Hearing? N  Vision? N    Difficulty concentrating or making decisions? N  Walking or climbing stairs? N  Dressing or bathing? N  Doing errands, shopping? N  Preparing Food and eating ? N  Using the Toilet? N  In the past six months, have you accidently leaked urine? N  Do you have problems with loss of bowel control? Y  Managing your Medications? N  Managing your Finances? N  Housekeeping or managing your Housekeeping? N  Some recent data might be hidden     Cardiac Risk Factors include: advanced age (>26mn, >>35women);dyslipidemia;hypertension;obesity (BMI >30kg/m2);sedentary lifestyle  Depression Screen PHQ 2/9 Scores 05/21/2016 02/06/2016 08/31/2015 07/28/2015  PHQ - 2 Score 0 0 0 1     Fall Risk Fall Risk  05/21/2016 02/06/2016 08/31/2015 07/28/2015 03/13/2015  Falls in the past year? _1     Cognitive Function: MMSE - Mini Mental State Exam 05/21/2016 01/10/2015  Orientation to time 5 5  Orientation to Place 5 5  Registration 3 3  Attention/ Calculation 5 5  Recall 3 3  Language- name 2 objects 2 2  Language- repeat - 1  Language- follow 3 step command - 3  Language- read & follow direction - 1  Write a sentence - 1  Copy design - 1  Total score - 30    Immunizations and Health Maintenance Immunization History  Administered Date(s) Administered  . Influenza,inj,Quad PF,36+ Mos 07/25/2014, 07/28/2015  . Pneumococcal Conjugate-13 07/25/2014  . Pneumococcal Polysaccharide-23 06/07/2011  . Tdap 06/07/2011  . Zoster 01/25/2015   Health Maintenance Due  Topic Date Due  . INFLUENZA VACCINE  04/23/2016    Patient Care Team: MChevis Pretty FNP as PCP - General (Nurse Practitioner) TNewton Pigg MD as Consulting Physician (Obstetrics and Gynecology)  Indicate any recent Medical Services you may have received from other than [facility] providers in the past year (date may be approximate).    Assessment:    Annual Wellness Visit  Anemia Medication management   Screening  Tests Health Maintenance  Topic Date Due  . INFLUENZA VACCINE  04/23/2016  . MAMMOGRAM  09/07/2016  . TETANUS/TDAP  06/06/2021  . DEXA SCAN  Completed  . ZOSTAVAX  Completed  . Hepatitis C Screening  Completed  . PNA vac Low Risk Adult  Completed        Plan:   During the course of the visit BKafiwas educated and counseled about the following appropriate screening and preventive services:   Vaccines to include Pneumoccal, Influenza, Td, Zostavax - all are UTD  Colorectal cancer screening - UTD  Cardiovascular disease screening - last EKG was 2016  Diabetes screening - last FBG was 97 (01/2016)  Bone Denisty / Osteoporosis Screening - UTD  Mammogram - was done  08/2015 due at end of this year.  Nutrition counseling - discussed increasing vegetables, fruits and lean proteins.  Increase low calorie foods.   Advanced Directives - UTD; copy requested  Physical Activity - gave information on chair exercises and maybe add walking 10 minutes daily to start.   Change iron supplement to qod.  Plan to recheck CBC at next PCP visit in 8 weeks.      Patient Instructions (the written plan) were given to the patient.   Cherre Robins, PharmD   05/21/2016

## 2016-05-24 ENCOUNTER — Other Ambulatory Visit: Payer: Self-pay | Admitting: Nurse Practitioner

## 2016-08-07 ENCOUNTER — Other Ambulatory Visit: Payer: Self-pay | Admitting: Obstetrics and Gynecology

## 2016-08-07 DIAGNOSIS — Z1231 Encounter for screening mammogram for malignant neoplasm of breast: Secondary | ICD-10-CM

## 2016-08-13 ENCOUNTER — Encounter: Payer: Self-pay | Admitting: Nurse Practitioner

## 2016-08-13 ENCOUNTER — Ambulatory Visit (INDEPENDENT_AMBULATORY_CARE_PROVIDER_SITE_OTHER): Payer: Medicare Other | Admitting: Nurse Practitioner

## 2016-08-13 VITALS — BP 126/67 | HR 67 | Temp 98.1°F | Ht 63.0 in | Wt 227.0 lb

## 2016-08-13 DIAGNOSIS — Z23 Encounter for immunization: Secondary | ICD-10-CM | POA: Diagnosis not present

## 2016-08-13 DIAGNOSIS — K219 Gastro-esophageal reflux disease without esophagitis: Secondary | ICD-10-CM | POA: Diagnosis not present

## 2016-08-13 DIAGNOSIS — D649 Anemia, unspecified: Secondary | ICD-10-CM

## 2016-08-13 DIAGNOSIS — I10 Essential (primary) hypertension: Secondary | ICD-10-CM

## 2016-08-13 DIAGNOSIS — L02436 Carbuncle of left lower limb: Secondary | ICD-10-CM

## 2016-08-13 DIAGNOSIS — E782 Mixed hyperlipidemia: Secondary | ICD-10-CM | POA: Diagnosis not present

## 2016-08-13 DIAGNOSIS — E559 Vitamin D deficiency, unspecified: Secondary | ICD-10-CM | POA: Diagnosis not present

## 2016-08-13 MED ORDER — LISINOPRIL-HYDROCHLOROTHIAZIDE 20-12.5 MG PO TABS: 1.0000 | ORAL_TABLET | Freq: Every day | ORAL | 1 refills | Status: DC

## 2016-08-13 MED ORDER — ROSUVASTATIN CALCIUM 20 MG PO TABS: 20.0000 mg | ORAL_TABLET | ORAL | 1 refills | Status: DC

## 2016-08-13 MED ORDER — OMEPRAZOLE 20 MG PO CPDR: 20.0000 mg | DELAYED_RELEASE_CAPSULE | Freq: Every day | ORAL | 5 refills | Status: DC

## 2016-08-13 MED ORDER — FERROUS SULFATE 325 (65 FE) MG PO TABS: 325.0000 mg | ORAL_TABLET | ORAL | 0 refills | Status: DC

## 2016-08-13 MED ORDER — DOXYCYCLINE HYCLATE 100 MG PO TABS
100.0000 mg | ORAL_TABLET | Freq: Two times a day (BID) | ORAL | 0 refills | Status: DC
Start: 2016-08-13 — End: 2016-12-16

## 2016-08-13 NOTE — Patient Instructions (Signed)

## 2016-08-13 NOTE — Progress Notes (Signed)
Subjective:    Patient ID: Sarah Benton, female    DOB: January 08, 1946, 70 y.o.   MRN: [id number]  HPI    Patient here today for follow up of chronic medical problems.   Outpatient Encounter Prescriptions as of 08/13/2016  Medication Sig  . Cholecalciferol (VITAMIN D3) 2000 UNITS capsule Take 2,000 Units by mouth daily.    . ferrous sulfate 325 (65 FE) MG tablet Take 1 tablet (325 mg total) by mouth every other day.  . lisinopril-hydrochlorothiazide (PRINZIDE,ZESTORETIC) 20-12.5 MG tablet Take 1 tablet by mouth daily.  . meloxicam (MOBIC) 15 MG tablet Take 1 tablet (15 mg total) by mouth daily.  Marland Kitchen omeprazole (PRILOSEC) 20 MG capsule Take 1 capsule (20 mg total) by mouth daily.  . rosuvastatin (CRESTOR) 20 MG tablet Take 1 tablet (20 mg total) by mouth every Monday, Wednesday, and Friday.   No facility-administered encounter medications on file as of 08/13/2016.     * C/o of "Blood boils"- like a raised lesion with blood in it and when it ruptures it bleeds out and goes away. She has had these on and off for many years.   Hypertension This is a chronic problem. The current episode started more than 1 year ago. The problem is controlled. Pertinent negatives include no chest pain, headaches, neck pain, palpitations or shortness of breath. Risk factors for coronary artery disease include sedentary lifestyle, dyslipidemia, obesity and post-menopausal state. Past treatments include ACE inhibitors and diuretics. The current treatment provides significant improvement. Compliance problems include diet and exercise.  There is no history of CAD/MI.  Hyperlipidemia This is a chronic problem. The current episode started more than 1 year ago. Recent lipid tests were reviewed and are variable. Factors aggravating her hyperlipidemia include thiazides. Pertinent negatives include no chest pain or shortness of breath. Current antihyperlipidemic treatment includes statins. The current treatment provides  moderate improvement of lipids. Compliance problems include adherence to diet and adherence to exercise.  Risk factors for coronary artery disease include dyslipidemia, hypertension, obesity and post-menopausal.  GERD Is currentty on omeprazole 20 mg daily which keeps syptoms under control anemia Is currently on ferrous sulfate which is not bothering her. Vitamin d def Taking vitamin d 2000 IU oTC daily- no side effects     Review of Systems  Constitutional: Negative.   HENT: Negative.   Respiratory: Negative.   Cardiovascular: Negative.   Gastrointestinal: Negative.   Genitourinary: Negative.   Neurological: Negative.   Psychiatric/Behavioral: Negative.   All other systems reviewed and are negative.      Objective:   Physical Exam  Constitutional: She is oriented to person, place, and time. She appears well-developed and well-nourished.  HENT:  Nose: Nose normal.  Mouth/Throat: Oropharynx is clear and moist.  Eyes: EOM are normal.  Neck: Trachea normal, normal range of motion and full passive range of motion without pain. Neck supple. No JVD present. Carotid bruit is not present. No thyromegaly present.  Cardiovascular: Normal rate, regular rhythm, normal heart sounds and intact distal pulses.  Exam reveals no gallop and no friction rub.   No murmur heard. Pulmonary/Chest: Effort normal and breath sounds normal.  Abdominal: Soft. Bowel sounds are normal. She exhibits no distension and no mass. There is no tenderness.  Musculoskeletal: Normal range of motion.  Lymphadenopathy:    She has no cervical adenopathy.  Neurological: She is alert and oriented to person, place, and time. She has normal reflexes.  Skin: Skin is warm and dry.  Carbuncle left  upper inner thigh  Psychiatric: She has a normal mood and affect. Her behavior is normal. Judgment and thought content normal.   BP 126/67   Pulse 67   Temp 98.1 F (36.7 C) (Oral)   Ht '5\' 3"'  (1.6 m)   Wt 227 lb (103 kg)    BMI 40.21 kg/m        Assessment & Plan:  1. Essential hypertension, benign Low sodium diet - lisinopril-hydrochlorothiazide (PRINZIDE,ZESTORETIC) 20-12.5 MG tablet; Take 1 tablet by mouth daily.  Dispense: 90 tablet; Refill: 1 - CMP14+EGFR  2. Gastroesophageal reflux disease without esophagitis Avoid spicy foods Do not eat 2 hours prior to bedtime  3. Mixed hyperlipidemia Low fat diet - rosuvastatin (CRESTOR) 20 MG tablet; Take 1 tablet (20 mg total) by mouth every Monday, Wednesday, and Friday.  Dispense: 45 tablet; Refill: 1 - Lipid panel  4. Morbid obesity ([place]) Discussed diet and exercise for person with BMI >25 Will recheck weight in 3-6 months  5. Normocytic anemia Labs pending - ferrous sulfate 325 (65 FE) MG tablet; Take 1 tablet (325 mg total) by mouth every other day.  Dispense: 45 tablet; Refill: 0 - Anemia Profile B  6. Vitamin D deficiency  7. Gastroesophageal reflux disease, esophagitis presence not specified Avoid spicy foods Do not eat 2 hours prior to bedtime  - omeprazole (PRILOSEC) 20 MG capsule; Take 1 capsule (20 mg total) by mouth daily.  Dispense: 30 capsule; Refill: 5  8 carbuncle left leg Do not pick at area Warm compresses - doxycycline 124m 1 po BID    Labs pending Health maintenance reviewed Diet and exercise encouraged Continue all meds Follow up  In 3 months   [place] FNP

## 2016-08-14 LAB — ANEMIA PROFILE B
BASOS ABS: 0 10*3/uL (ref 0.0–0.2)
Basos: 0 %
EOS (ABSOLUTE): 0.2 10*3/uL (ref 0.0–0.4)
Eos: 2 %
FOLATE: 5.1 ng/mL (ref >3.0)
Ferritin: 227 ng/mL — ABNORMAL HIGH (ref 15–150)
Hematocrit: 40.6 % (ref 34.0–46.6)
Hemoglobin: 13.2 g/dL (ref 11.1–15.9)
Immature Grans (Abs): 0 10*3/uL (ref 0.0–0.1)
Immature Granulocytes: 0 %
Iron Saturation: 18 % (ref 15–55)
Iron: 56 ug/dL (ref 27–139)
Lymphocytes Absolute: 1.8 10*3/uL (ref 0.7–3.1)
Lymphs: 20 %
MCH: 28.5 pg (ref 26.6–33.0)
MCHC: 32.5 g/dL (ref 31.5–35.7)
MCV: 88 fL (ref 79–97)
MONOCYTES: 7 %
Monocytes Absolute: 0.6 10*3/uL (ref 0.1–0.9)
NEUTROS ABS: 6.6 10*3/uL (ref 1.4–7.0)
Neutrophils: 71 %
PLATELETS: 285 10*3/uL (ref 150–379)
RBC: 4.63 x10E6/uL (ref 3.77–5.28)
RDW: 13.2 % (ref 12.3–15.4)
RETIC CT PCT: 1.7 % (ref 0.6–2.6)
Total Iron Binding Capacity: 314 ug/dL (ref 250–450)
UIBC: 258 ug/dL (ref 118–369)
VITAMIN B 12: 333 pg/mL (ref 211–946)
WBC: 9.2 10*3/uL (ref 3.4–10.8)

## 2016-08-14 LAB — LIPID PANEL
CHOL/HDL RATIO: 3.8 ratio (ref 0.0–4.4)
Cholesterol, Total: 182 mg/dL (ref 100–199)
HDL: 48 mg/dL (ref >39)
LDL CALC: 104 mg/dL — AB (ref 0–99)
TRIGLYCERIDES: 152 mg/dL — AB (ref 0–149)
VLDL Cholesterol Cal: 30 mg/dL (ref 5–40)

## 2016-08-14 LAB — CMP14+EGFR
ALT: 26 IU/L (ref 0–32)
AST: 17 IU/L (ref 0–40)
Albumin/Globulin Ratio: 1.8 (ref 1.2–2.2)
Albumin: 4 g/dL (ref 3.5–4.8)
Alkaline Phosphatase: 112 IU/L (ref 39–117)
BUN/Creatinine Ratio: 18 (ref 12–28)
BUN: 17 mg/dL (ref 8–27)
Bilirubin Total: 0.4 mg/dL (ref 0.0–1.2)
CALCIUM: 9.4 mg/dL (ref 8.7–10.3)
CO2: 25 mmol/L (ref 18–29)
CREATININE: 0.93 mg/dL (ref 0.57–1.00)
Chloride: 102 mmol/L (ref 96–106)
GFR calc Af Amer: 71 mL/min/{1.73_m2} (ref >59)
GFR, EST NON AFRICAN AMERICAN: 62 mL/min/{1.73_m2} (ref >59)
GLUCOSE: 100 mg/dL — AB (ref 65–99)
Globulin, Total: 2.2 g/dL (ref 1.5–4.5)
POTASSIUM: 4.6 mmol/L (ref 3.5–5.2)
Sodium: 142 mmol/L (ref 134–144)
Total Protein: 6.2 g/dL (ref 6.0–8.5)

## 2016-09-18 ENCOUNTER — Ambulatory Visit
Admission: RE | Admit: 2016-09-18 | Discharge: 2016-09-18 | Disposition: A | Discharge status: Home / Self Care | Payer: Medicare Other | Source: Ambulatory Visit | Attending: Obstetrics and Gynecology | Admitting: Obstetrics and Gynecology

## 2016-09-18 DIAGNOSIS — Z1231 Encounter for screening mammogram for malignant neoplasm of breast: Secondary | ICD-10-CM

## 2016-10-02 ENCOUNTER — Other Ambulatory Visit: Payer: Self-pay | Admitting: Nurse Practitioner

## 2016-12-16 ENCOUNTER — Ambulatory Visit (INDEPENDENT_AMBULATORY_CARE_PROVIDER_SITE_OTHER): Payer: Medicare Other | Admitting: Nurse Practitioner

## 2016-12-16 ENCOUNTER — Encounter: Payer: Self-pay | Admitting: Nurse Practitioner

## 2016-12-16 VITALS — BP 136/78 | HR 67 | Temp 97.6°F | Ht 63.0 in | Wt 230.0 lb

## 2016-12-16 DIAGNOSIS — R2231 Localized swelling, mass and lump, right upper limb: Secondary | ICD-10-CM | POA: Diagnosis not present

## 2016-12-16 NOTE — Patient Instructions (Signed)
Breast Ultrasound Breast ultrasound, or breast ultrasonogram, is a procedure that is used to check for breast problems. A breast ultrasound uses harmless sound waves and a computer to create pictures of the inside of your breast. This procedure can help determine whether a lump in your breast is a fluid-filled sac (cyst), a solid tumor, or a growth. It can also help determine whether a tumor is cancerous (malignant) or noncancerous (benign). Sometimes a breast ultrasound is done to locate nodules in the breast that are too small to feel but need to be removed during surgery. A breast ultrasound takes about 30 minutes. Tell a health care provider about:  Any allergies you have.  All medicines you are taking, including vitamins, herbs, eye drops, creams, and over-the-counter medicines.  Any blood disorders you have.  Any surgeries you have had.  Any medical conditions you have. What are the risks? Breast ultrasound is safe and painless. Ultrasound imaging does not use X-rays. There are no known risks for this procedure. What happens before the procedure? You do not need to prepare for a breast ultrasound. You will undress from your waist up, so wear loose, comfortable clothing on the day of the procedure. What happens during the procedure?  You will lie down on an examining table.  A health care provider will apply gel to your breast area.  You may be asked to hold your arm above your head.  The health care provider will move a handheld probe, which looks like a microphone, back and forth over your breast.  The probe will send signals to a computer that will create images of your breast.  When the procedure is over, the gel will be cleaned from your breast, and you can get dressed. What happens after the procedure?  You can go home right away and do all of your usual activities.  It is up to you to get the results of your procedure. Ask your health care provider, or the department  that is doing the procedure, when your results will be ready. Summary  A breast ultrasound is a procedure that is used to check for breast problems.  This procedure uses harmless sound waves and a computer to create pictures of the inside of your breast.  This is a safe procedure. This information is not intended to replace advice given to you by your health care provider. Make sure you discuss any questions you have with your health care provider. Document Released: 10/01/2004 Document Revised: 05/16/2016 Document Reviewed: 05/13/2016 Elsevier Interactive Patient Education  2017 Elsevier Inc.  

## 2016-12-16 NOTE — Progress Notes (Signed)
   Subjective:    Patient ID: Sarah Benton, female    DOB: [birth year], 71 y.o.   MRN: [id number]  HPI: Patient presents today with right axilla swelling/tenderness beginning 5 days ago and worsening into today.  Review of Systems  Constitutional: Negative.   Respiratory: Negative.   Cardiovascular: Negative.   Musculoskeletal: Negative.   Skin:       Right axilla erythema and swelling   All other systems reviewed and are negative.      Objective:   Physical Exam  Constitutional: She is oriented to person, place, and time. She appears well-developed and well-nourished.  Cardiovascular: Normal rate and regular rhythm.   Pulmonary/Chest: Effort normal and breath sounds normal.  Musculoskeletal: Normal range of motion.  Neurological: She is oriented to person, place, and time.  Skin:  2 cm tender right posterior axillary nodule  No erythema  No drainage [place]  Psychiatric: She has a normal mood and affect. Her behavior is normal. Judgment and thought content normal.   BP 136/78   Pulse 67   Temp 97.6 F (36.4 C) (Oral)   Ht 5\' 3"  (1.6 m)   Wt 230 lb (104.3 kg)   BMI 40.74 kg/m     Assessment & Plan:  1. Axillary mass, right Will follow up with ultrasound results. Motrin/Tylenol for pain. - [place] AXILLA RIGHT; Future  Dionisio David, FNP student [employer], FNP

## 2016-12-17 ENCOUNTER — Other Ambulatory Visit: Payer: Self-pay | Admitting: Nurse Practitioner

## 2016-12-17 DIAGNOSIS — R2231 Localized swelling, mass and lump, right upper limb: Secondary | ICD-10-CM

## 2016-12-17 DIAGNOSIS — N6310 Unspecified lump in the right breast, unspecified quadrant: Secondary | ICD-10-CM

## 2016-12-24 ENCOUNTER — Other Ambulatory Visit: Payer: Self-pay | Admitting: Nurse Practitioner

## 2016-12-24 ENCOUNTER — Ambulatory Visit
Admission: RE | Admit: 2016-12-24 | Discharge: 2016-12-24 | Disposition: A | Discharge status: Home / Self Care | Payer: Medicare Other | Source: Ambulatory Visit | Attending: Nurse Practitioner | Admitting: Nurse Practitioner

## 2016-12-24 ENCOUNTER — Other Ambulatory Visit (HOSPITAL_COMMUNITY): Payer: Medicare Other

## 2016-12-24 DIAGNOSIS — N6310 Unspecified lump in the right breast, unspecified quadrant: Secondary | ICD-10-CM

## 2016-12-24 DIAGNOSIS — R2231 Localized swelling, mass and lump, right upper limb: Secondary | ICD-10-CM

## 2017-01-07 ENCOUNTER — Encounter (HOSPITAL_COMMUNITY): Payer: Medicare Other

## 2017-01-13 ENCOUNTER — Other Ambulatory Visit: Payer: Self-pay | Admitting: Nurse Practitioner

## 2017-02-11 ENCOUNTER — Ambulatory Visit (INDEPENDENT_AMBULATORY_CARE_PROVIDER_SITE_OTHER): Payer: Medicare Other | Admitting: Nurse Practitioner

## 2017-02-11 ENCOUNTER — Other Ambulatory Visit: Payer: Self-pay | Admitting: Nurse Practitioner

## 2017-02-11 ENCOUNTER — Encounter: Payer: Self-pay | Admitting: Nurse Practitioner

## 2017-02-11 VITALS — BP 117/68 | HR 65 | Temp 98.0°F | Ht 63.0 in | Wt 228.0 lb

## 2017-02-11 DIAGNOSIS — I10 Essential (primary) hypertension: Secondary | ICD-10-CM

## 2017-02-11 DIAGNOSIS — R601 Generalized edema: Secondary | ICD-10-CM | POA: Diagnosis not present

## 2017-02-11 DIAGNOSIS — K219 Gastro-esophageal reflux disease without esophagitis: Secondary | ICD-10-CM | POA: Diagnosis not present

## 2017-02-11 DIAGNOSIS — E782 Mixed hyperlipidemia: Secondary | ICD-10-CM

## 2017-02-11 DIAGNOSIS — E559 Vitamin D deficiency, unspecified: Secondary | ICD-10-CM | POA: Diagnosis not present

## 2017-02-11 LAB — CMP14+EGFR
ALT: 30 IU/L (ref 0–32)
AST: 22 IU/L (ref 0–40)
Albumin/Globulin Ratio: 1.8 (ref 1.2–2.2)
Albumin: 4 g/dL (ref 3.5–4.8)
Alkaline Phosphatase: 115 IU/L (ref 39–117)
BUN/Creatinine Ratio: 21 (ref 12–28)
BUN: 21 mg/dL (ref 8–27)
Bilirubin Total: 0.4 mg/dL (ref 0.0–1.2)
CO2: 27 mmol/L (ref 18–29)
Calcium: 9.4 mg/dL (ref 8.7–10.3)
Chloride: 104 mmol/L (ref 96–106)
Creatinine, Ser: 0.98 mg/dL (ref 0.57–1.00)
GFR calc Af Amer: 66 mL/min/1.73 (ref >59)
GFR calc non Af Amer: 57 mL/min/1.73 — ABNORMAL LOW (ref >59)
Globulin, Total: 2.2 g/dL (ref 1.5–4.5)
Glucose: 93 mg/dL (ref 65–99)
Potassium: 4.8 mmol/L (ref 3.5–5.2)
Sodium: 145 mmol/L — ABNORMAL HIGH (ref 134–144)
Total Protein: 6.2 g/dL (ref 6.0–8.5)

## 2017-02-11 LAB — LIPID PANEL
Chol/HDL Ratio: 3.6 ratio (ref 0.0–4.4)
Cholesterol, Total: 174 mg/dL (ref 100–199)
HDL: 48 mg/dL (ref >39)
LDL Calculated: 100 mg/dL — ABNORMAL HIGH (ref 0–99)
Triglycerides: 132 mg/dL (ref 0–149)
VLDL Cholesterol Cal: 26 mg/dL (ref 5–40)

## 2017-02-11 MED ORDER — LISINOPRIL-HYDROCHLOROTHIAZIDE 20-12.5 MG PO TABS: 1.0000 | ORAL_TABLET | Freq: Every day | ORAL | 1 refills | Status: DC

## 2017-02-11 MED ORDER — ROSUVASTATIN CALCIUM 20 MG PO TABS: 20.0000 mg | ORAL_TABLET | ORAL | 1 refills | Status: DC

## 2017-02-11 MED ORDER — OMEPRAZOLE 20 MG PO CPDR: 20.0000 mg | DELAYED_RELEASE_CAPSULE | Freq: Every day | ORAL | 5 refills | Status: DC

## 2017-02-11 NOTE — Progress Notes (Signed)
Subjective:    Patient ID: Sarah Benton, female    DOB: 02-05-1946, 71 y.o.   MRN: [id number]  HPI Sarah Benton is here today for follow up of chronic medical problem.  Outpatient Encounter Prescriptions as of 02/11/2017  Medication Sig  . Cholecalciferol (VITAMIN D3) 2000 UNITS capsule Take 2,000 Units by mouth daily.    Marland Kitchen lisinopril-hydrochlorothiazide (PRINZIDE,ZESTORETIC) 20-12.5 MG tablet Take 1 tablet by mouth daily.  . meloxicam (MOBIC) 15 MG tablet Take 1 tablet (15 mg total) by mouth daily.  Marland Kitchen omeprazole (PRILOSEC) 20 MG capsule Take 1 capsule (20 mg total) by mouth daily.  . rosuvastatin (CRESTOR) 20 MG tablet Take 1 tablet (20 mg total) by mouth every Monday, Wednesday, and Friday.   No facility-administered encounter medications on file as of 02/11/2017.     1. Essential hypertension, benign  Patient managed with Prinzide, but does not check blood pressure at home.  2. Gastroesophageal reflux disease without esophagitis Patient takes omeprazole for management.   3. Mixed hyperlipidemia  Patient taking rosuvastatin for management.  4. Morbid obesity ([place])  No recent change in weight.  5. Generalized edema  Patient monitoring edema at home with no recent change.  6. Vitamin D deficiency  Patient taking vitamin D supplement.    New complaints: None today.    Review of Systems  Constitutional: Negative for activity change, appetite change and fatigue.  Respiratory: Negative for cough and shortness of breath.   Cardiovascular: Negative for chest pain and palpitations.  Gastrointestinal: Negative for abdominal distention and abdominal pain.  Neurological: Negative for dizziness and headaches.  All other systems reviewed and are negative.      Objective:   Physical Exam  Constitutional: She is oriented to person, place, and time. She appears well-developed and well-nourished.  HENT:  Head: Normocephalic.  Mouth/Throat: Oropharynx is clear and moist.    Eyes: Pupils are equal, round, and reactive to light.  Neck: Normal range of motion. Neck supple.  Cardiovascular: Normal rate, regular rhythm and normal heart sounds.   No murmur heard. Pulmonary/Chest: Effort normal and breath sounds normal.  Abdominal: Soft. Bowel sounds are normal. There is no tenderness.  Musculoskeletal: Normal range of motion.  Neurological: She is alert and oriented to person, place, and time.  Skin: Skin is warm and dry.  Psychiatric: She has a normal mood and affect. Her behavior is normal. Judgment and thought content normal.   BP 117/68   Pulse 65   Temp 98 F (36.7 C) (Oral)   Ht '5\' 3"'$  (1.6 m)   Wt 228 lb (103.4 kg)   BMI 40.39 kg/m      Assessment & Plan:  1. Essential hypertension, benign Low sodium diet - CMP14+EGFR - lisinopril-hydrochlorothiazide (PRINZIDE,ZESTORETIC) 20-12.5 MG tablet; Take 1 tablet by mouth daily.  Dispense: 90 tablet; Refill: 1  2. Gastroesophageal reflux disease without esophagitis Avoid spicy foods Do not eat 2 hours prior to bedtime- omeprazole (PRILOSEC) 20 MG capsule; Take 1 capsule (20 mg total) by mouth daily.  Dispense: 30 capsule; Refill: 5  3. Mixed hyperlipidemia Low fat  diet - Lipid panel - rosuvastatin (CRESTOR) 20 MG tablet; Take 1 tablet (20 mg total) by mouth every Monday, Wednesday, and Friday.  Dispense: 45 tablet; Refill: 1  4. Morbid obesity ([place]) Discussed diet and exercise for person with BMI >25 Will recheck weight in 3-6 months  5. Generalized edema  6. Vitamin D deficiency Continue vitamin d OTC    Labs pending  Health maintenance reviewed Diet and exercise encouraged Continue all meds Follow up  In 6 months   [place], FNP

## 2017-02-11 NOTE — Patient Instructions (Signed)
Health Maintenance, Female Adopting a healthy lifestyle and getting preventive care can go a long way to promote health and wellness. Talk with your health care provider about what schedule of regular examinations is right for you. This is a good chance for you to check in with your provider about disease prevention and staying healthy. In between checkups, there are plenty of things you can do on your own. Experts have done a lot of research about which lifestyle changes and preventive measures are most likely to keep you healthy. Ask your health care provider for more information. Weight and diet Eat a healthy diet  Be sure to include plenty of vegetables, fruits, low-fat dairy products, and lean protein.  Do not eat a lot of foods high in solid fats, added sugars, or salt.  Get regular exercise. This is one of the most important things you can do for your health.  Most adults should exercise for at least 150 minutes each week. The exercise should increase your heart rate and make you sweat (moderate-intensity exercise).  Most adults should also do strengthening exercises at least twice a week. This is in addition to the moderate-intensity exercise. Maintain a healthy weight  Body mass index (BMI) is a measurement that can be used to identify possible weight problems. It estimates body fat based on height and weight. Your health care provider can help determine your BMI and help you achieve or maintain a healthy weight.  For females 73 years of age and older:  A BMI below 18.5 is considered underweight.  A BMI of 18.5 to 24.9 is normal.  A BMI of 25 to 29.9 is considered overweight.  A BMI of 30 and above is considered obese. Watch levels of cholesterol and blood lipids  You should start having your blood tested for lipids and cholesterol at 73 years of age, then have this test every 5 years.  You may need to have your cholesterol levels checked more often if:  Your lipid or  cholesterol levels are high.  You are older than 73 years of age.  You are at high risk for heart disease. Cancer screening Lung Cancer  Lung cancer screening is recommended for adults 73-42 years old who are at high risk for lung cancer because of a history of smoking.  A yearly low-dose CT scan of the lungs is recommended for people who:  Currently smoke.  Have quit within the past 15 years.  Have at least a 30-pack-year history of smoking. A pack year is smoking an average of one pack of cigarettes a day for 1 year.  Yearly screening should continue until it has been 15 years since you quit.  Yearly screening should stop if you develop a health problem that would prevent you from having lung cancer treatment. Breast Cancer  Practice breast self-awareness. This means understanding how your breasts normally appear and feel.  It also means doing regular breast self-exams. Let your health care provider know about any changes, no matter how small.  If you are in your 20s or 30s, you should have a clinical breast exam (CBE) by a health care provider every 1-3 years as part of a regular health exam.  If you are 34 or older, have a CBE every year. Also consider having a breast X-ray (mammogram) every year.  If you have a family history of breast cancer, talk to your health care provider about genetic screening.  If you are at high risk for breast cancer, talk  to your health care provider about having an MRI and a mammogram every year.  Breast cancer gene (BRCA) assessment is recommended for women who have family members with BRCA-related cancers. BRCA-related cancers include:  Breast.  Ovarian.  Tubal.  Peritoneal cancers.  Results of the assessment will determine the need for genetic counseling and BRCA1 and BRCA2 testing. Cervical Cancer  Your health care provider may recommend that you be screened regularly for cancer of the pelvic organs (ovaries, uterus, and vagina).  This screening involves a pelvic examination, including checking for microscopic changes to the surface of your cervix (Pap test). You may be encouraged to have this screening done every 3 years, beginning at age 24.  For women ages 66-65, health care providers may recommend pelvic exams and Pap testing every 3 years, or they may recommend the Pap and pelvic exam, combined with testing for human papilloma virus (HPV), every 5 years. Some types of HPV increase your risk of cervical cancer. Testing for HPV may also be done on women of any age with unclear Pap test results.  Other health care providers may not recommend any screening for nonpregnant women who are considered low risk for pelvic cancer and who do not have symptoms. Ask your health care provider if a screening pelvic exam is right for you.  If you have had past treatment for cervical cancer or a condition that could lead to cancer, you need Pap tests and screening for cancer for at least 20 years after your treatment. If Pap tests have been discontinued, your risk factors (such as having a new sexual partner) need to be reassessed to determine if screening should resume. Some women have medical problems that increase the chance of getting cervical cancer. In these cases, your health care provider may recommend more frequent screening and Pap tests. Colorectal Cancer  This type of cancer can be detected and often prevented.  Routine colorectal cancer screening usually begins at 73 years of age and continues through 73 years of age.  Your health care provider may recommend screening at an earlier age if you have risk factors for colon cancer.  Your health care provider may also recommend using home test kits to check for hidden blood in the stool.  A small camera at the end of a tube can be used to examine your colon directly (sigmoidoscopy or colonoscopy). This is done to check for the earliest forms of colorectal cancer.  Routine  screening usually begins at age 41.  Direct examination of the colon should be repeated every 5-10 years through 73 years of age. However, you may need to be screened more often if early forms of precancerous polyps or small growths are found. Skin Cancer  Check your skin from head to toe regularly.  Tell your health care provider about any new moles or changes in moles, especially if there is a change in a mole's shape or color.  Also tell your health care provider if you have a mole that is larger than the size of a pencil eraser.  Always use sunscreen. Apply sunscreen liberally and repeatedly throughout the day.  Protect yourself by wearing long sleeves, pants, a wide-brimmed hat, and sunglasses whenever you are outside. Heart disease, diabetes, and high blood pressure  High blood pressure causes heart disease and increases the risk of stroke. High blood pressure is more likely to develop in:  People who have blood pressure in the high end of the normal range (130-139/85-89 mm Hg).  People who are overweight or obese.  People who are African American.  If you are 59-24 years of age, have your blood pressure checked every 3-5 years. If you are 34 years of age or older, have your blood pressure checked every year. You should have your blood pressure measured twice-once when you are at a hospital or clinic, and once when you are not at a hospital or clinic. Record the average of the two measurements. To check your blood pressure when you are not at a hospital or clinic, you can use:  An automated blood pressure machine at a pharmacy.  A home blood pressure monitor.  If you are between 29 years and 60 years old, ask your health care provider if you should take aspirin to prevent strokes.  Have regular diabetes screenings. This involves taking a blood sample to check your fasting blood sugar level.  If you are at a normal weight and have a low risk for diabetes, have this test once  every three years after 73 years of age.  If you are overweight and have a high risk for diabetes, consider being tested at a younger age or more often. Preventing infection Hepatitis B  If you have a higher risk for hepatitis B, you should be screened for this virus. You are considered at high risk for hepatitis B if:  You were born in a country where hepatitis B is common. Ask your health care provider which countries are considered high risk.  Your parents were born in a high-risk country, and you have not been immunized against hepatitis B (hepatitis B vaccine).  You have HIV or AIDS.  You use needles to inject street drugs.  You live with someone who has hepatitis B.  You have had sex with someone who has hepatitis B.  You get hemodialysis treatment.  You take certain medicines for conditions, including cancer, organ transplantation, and autoimmune conditions. Hepatitis C  Blood testing is recommended for:  Everyone born from [birth year] through 1967.  Anyone with known risk factors for hepatitis C. Sexually transmitted infections (STIs)  You should be screened for sexually transmitted infections (STIs) including gonorrhea and chlamydia if:  You are sexually active and are younger than 73 years of age.  You are older than 73 years of age and your health care provider tells you that you are at risk for this type of infection.  Your sexual activity has changed since you were last screened and you are at an increased risk for chlamydia or gonorrhea. Ask your health care provider if you are at risk.  If you do not have HIV, but are at risk, it may be recommended that you take a prescription medicine daily to prevent HIV infection. This is called pre-exposure prophylaxis (PrEP). You are considered at risk if:  You are sexually active and do not regularly use condoms or know the HIV status of your partner(s).  You take drugs by injection.  You are sexually active with a partner  who has HIV. Talk with your health care provider about whether you are at high risk of being infected with HIV. If you choose to begin PrEP, you should first be tested for HIV. You should then be tested every 3 months for as long as you are taking PrEP. Pregnancy  If you are premenopausal and you may become pregnant, ask your health care provider about preconception counseling.  If you may become pregnant, take 400 to 800 micrograms (mcg) of folic acid  every day.  If you want to prevent pregnancy, talk to your health care provider about birth control (contraception). Osteoporosis and menopause  Osteoporosis is a disease in which the bones lose minerals and strength with aging. This can result in serious bone fractures. Your risk for osteoporosis can be identified using a bone density scan.  If you are 4 years of age or older, or if you are at risk for osteoporosis and fractures, ask your health care provider if you should be screened.  Ask your health care provider whether you should take a calcium or vitamin D supplement to lower your risk for osteoporosis.  Menopause may have certain physical symptoms and risks.  Hormone replacement therapy may reduce some of these symptoms and risks. Talk to your health care provider about whether hormone replacement therapy is right for you. Follow these instructions at home:  Schedule regular health, dental, and eye exams.  Stay current with your immunizations.  Do not use any tobacco products including cigarettes, chewing tobacco, or electronic cigarettes.  If you are pregnant, do not drink alcohol.  If you are breastfeeding, limit how much and how often you drink alcohol.  Limit alcohol intake to no more than 1 drink per day for nonpregnant women. One drink equals 12 ounces of beer, 5 ounces of wine, or 1 ounces of hard liquor.  Do not use street drugs.  Do not share needles.  Ask your health care provider for help if you need support  or information about quitting drugs.  Tell your health care provider if you often feel depressed.  Tell your health care provider if you have ever been abused or do not feel safe at home. This information is not intended to replace advice given to you by your health care provider. Make sure you discuss any questions you have with your health care provider. Document Released: 03/25/2011 Document Revised: 02/15/2016 Document Reviewed: 06/13/2015 Elsevier Interactive Patient Education  2017 [employer].

## 2017-03-07 ENCOUNTER — Ambulatory Visit (INDEPENDENT_AMBULATORY_CARE_PROVIDER_SITE_OTHER): Payer: Medicare Other | Admitting: Family Medicine

## 2017-03-07 ENCOUNTER — Encounter: Payer: Self-pay | Admitting: Family Medicine

## 2017-03-07 VITALS — BP 112/58 | HR 64 | Temp 98.0°F | Ht 63.0 in | Wt 227.0 lb

## 2017-03-07 DIAGNOSIS — R300 Dysuria: Secondary | ICD-10-CM

## 2017-03-07 DIAGNOSIS — R319 Hematuria, unspecified: Secondary | ICD-10-CM

## 2017-03-07 DIAGNOSIS — N3090 Cystitis, unspecified without hematuria: Secondary | ICD-10-CM

## 2017-03-07 LAB — URINALYSIS, COMPLETE
BILIRUBIN UA: NEGATIVE
GLUCOSE, UA: NEGATIVE
Ketones, UA: NEGATIVE
NITRITE UA: NEGATIVE
PH UA: 5 (ref 5.0–7.5)
Specific Gravity, UA: 1.015 (ref 1.005–1.030)
UUROB: 0.2 mg/dL (ref 0.2–1.0)

## 2017-03-07 LAB — MICROSCOPIC EXAMINATION
RBC, UA: >30 /hpf — AB (ref 0–?)
RENAL EPITHEL UA: NONE SEEN /HPF

## 2017-03-07 MED ORDER — CIPROFLOXACIN HCL 500 MG PO TABS: 500.0000 mg | ORAL_TABLET | Freq: Two times a day (BID) | ORAL | 0 refills | Status: DC

## 2017-03-07 NOTE — Addendum Note (Signed)
Addended by: Rolena Infante on: 03/07/2017 10:51 AM   Modules accepted: Orders

## 2017-03-07 NOTE — Progress Notes (Signed)
Chief Complaint  Patient presents with  . urinary pressure    HPI  Patient presents today for burning with urination and frequency for several days. Denies fever . No flank pain. No nausea, vomiting.   PMH: Smoking status noted ROS: Per HPI  Objective: BP (!) 112/58   Pulse 64   Temp 98 F (36.7 C) (Oral)   Ht 5\' 3"  (1.6 m)   Wt 227 lb (103 kg)   BMI 40.21 kg/m  Gen: NAD, alert, cooperative with exam HEENT: NCAT Abd: SNTND, BS present, no guarding or organomegaly Ext: No edema, warm Neuro: Alert and oriented, No gross deficits  Assessment and plan:  1. Dysuria   2. Cystitis   3. Hematuria, unspecified type     Meds ordered this encounter  Medications  . ciprofloxacin (CIPRO) 500 MG tablet    Sig: Take 1 tablet (500 mg total) by mouth 2 (two) times daily.    Dispense:  14 tablet    Refill:  0    Orders Placed This Encounter  Procedures  . Urinalysis, Complete  . Urinalysis, Complete    Standing Status:   Future    Standing Expiration Date:   03/07/2018   Come in for lab only for UA to check for resolution of hematuria in 10-14 days. If still present will need work up. Follow up as needed.  Claretta Fraise, MD

## 2017-03-10 LAB — URINE CULTURE

## 2017-03-17 ENCOUNTER — Other Ambulatory Visit: Payer: Medicare Other

## 2017-03-17 DIAGNOSIS — R319 Hematuria, unspecified: Secondary | ICD-10-CM

## 2017-03-17 LAB — URINALYSIS, COMPLETE
Bilirubin, UA: NEGATIVE
GLUCOSE, UA: NEGATIVE
Nitrite, UA: NEGATIVE
Specific Gravity, UA: 1.025 (ref 1.005–1.030)
Urobilinogen, Ur: 0.2 mg/dL (ref 0.2–1.0)
pH, UA: 5.5 (ref 5.0–7.5)

## 2017-03-17 LAB — MICROSCOPIC EXAMINATION: Renal Epithel, UA: NONE SEEN /hpf

## 2017-03-21 ENCOUNTER — Other Ambulatory Visit: Payer: Self-pay | Admitting: Family Medicine

## 2017-03-21 ENCOUNTER — Encounter: Payer: Self-pay | Admitting: Family Medicine

## 2017-03-21 DIAGNOSIS — R3129 Other microscopic hematuria: Secondary | ICD-10-CM

## 2017-03-21 MED ORDER — CIPROFLOXACIN HCL 500 MG PO TABS: 500.0000 mg | ORAL_TABLET | Freq: Two times a day (BID) | ORAL | 0 refills | Status: DC

## 2017-04-08 ENCOUNTER — Encounter: Payer: Self-pay | Admitting: Family Medicine

## 2017-04-28 ENCOUNTER — Other Ambulatory Visit: Payer: Self-pay | Admitting: Nurse Practitioner

## 2017-05-14 ENCOUNTER — Encounter: Payer: Self-pay | Admitting: *Deleted

## 2017-05-27 ENCOUNTER — Other Ambulatory Visit: Payer: Self-pay | Admitting: Family Medicine

## 2017-05-28 ENCOUNTER — Ambulatory Visit: Payer: Self-pay | Admitting: *Deleted

## 2017-06-26 ENCOUNTER — Other Ambulatory Visit: Payer: Self-pay | Admitting: Family Medicine

## 2017-08-07 ENCOUNTER — Other Ambulatory Visit: Payer: Self-pay | Admitting: Obstetrics and Gynecology

## 2017-08-07 DIAGNOSIS — Z1231 Encounter for screening mammogram for malignant neoplasm of breast: Secondary | ICD-10-CM

## 2017-08-12 ENCOUNTER — Encounter: Payer: Self-pay | Admitting: Family Medicine

## 2017-08-12 ENCOUNTER — Ambulatory Visit: Payer: Medicare Other | Admitting: Family Medicine

## 2017-08-12 VITALS — BP 124/66 | HR 74 | Temp 97.9°F | Ht 63.0 in | Wt 231.0 lb

## 2017-08-12 DIAGNOSIS — R399 Unspecified symptoms and signs involving the genitourinary system: Secondary | ICD-10-CM | POA: Diagnosis not present

## 2017-08-12 LAB — URINALYSIS
Bilirubin, UA: NEGATIVE
Glucose, UA: NEGATIVE
Ketones, UA: NEGATIVE
Nitrite, UA: NEGATIVE
PH UA: 5 (ref 5.0–7.5)
Protein, UA: NEGATIVE
SPEC GRAV UA: 1.02 (ref 1.005–1.030)
Urobilinogen, Ur: 0.2 mg/dL (ref 0.2–1.0)

## 2017-08-12 MED ORDER — CIPROFLOXACIN HCL 500 MG PO TABS: 500.0000 mg | ORAL_TABLET | Freq: Two times a day (BID) | ORAL | 0 refills | Status: DC

## 2017-08-12 NOTE — Progress Notes (Signed)
Chief Complaint  Patient presents with  . Urinary Urgency    pt here today c/o urinary urgency and "pressure" when voiding x 3 days.    HPI  Patient presents today for burning with urination and frequency for several days. Denies fever . No flank pain. No nausea, vomiting.   PMH: Smoking status noted ROS: Per HPI  Objective: BP 124/66   Pulse 74   Temp 97.9 F (36.6 C) (Oral)   Ht 5\' 3"  (1.6 m)   Wt 231 lb (104.8 kg)   BMI 40.92 kg/m  Gen: NAD, alert, cooperative with exam HEENT: NCAT, EOMI, PERRL CV: RRR, good S1/S2, no murmur Resp: CTABL, no wheezes, non-labored Abd: SNTND, BS present, no guarding or organomegaly Ext: No edema, warm Neuro: Alert and oriented, No gross deficits  Assessment and plan:  1. UTI symptoms     Meds ordered this encounter  Medications  . ciprofloxacin (CIPRO) 500 MG tablet    Sig: Take 1 tablet (500 mg total) by mouth 2 (two) times daily.    Dispense:  14 tablet    Refill:  0    Orders Placed This Encounter  Procedures  . Urine Culture  . Urinalysis    Follow up as needed.  Claretta Fraise, MD

## 2017-08-13 LAB — URINE CULTURE

## 2017-08-18 ENCOUNTER — Ambulatory Visit: Payer: Medicare Other | Admitting: Nurse Practitioner

## 2017-08-18 ENCOUNTER — Encounter: Payer: Self-pay | Admitting: Nurse Practitioner

## 2017-08-18 VITALS — BP 127/72 | HR 76 | Temp 98.1°F | Ht 63.0 in | Wt 231.0 lb

## 2017-08-18 DIAGNOSIS — I10 Essential (primary) hypertension: Secondary | ICD-10-CM

## 2017-08-18 DIAGNOSIS — E559 Vitamin D deficiency, unspecified: Secondary | ICD-10-CM

## 2017-08-18 DIAGNOSIS — D649 Anemia, unspecified: Secondary | ICD-10-CM

## 2017-08-18 DIAGNOSIS — K219 Gastro-esophageal reflux disease without esophagitis: Secondary | ICD-10-CM | POA: Diagnosis not present

## 2017-08-18 DIAGNOSIS — E782 Mixed hyperlipidemia: Secondary | ICD-10-CM

## 2017-08-18 DIAGNOSIS — R601 Generalized edema: Secondary | ICD-10-CM | POA: Diagnosis not present

## 2017-08-18 DIAGNOSIS — Z23 Encounter for immunization: Secondary | ICD-10-CM | POA: Diagnosis not present

## 2017-08-18 MED ORDER — ROSUVASTATIN CALCIUM 20 MG PO TABS: 20.0000 mg | ORAL_TABLET | ORAL | 1 refills | Status: DC

## 2017-08-18 MED ORDER — OMEPRAZOLE 20 MG PO CPDR: 20.0000 mg | DELAYED_RELEASE_CAPSULE | Freq: Every day | ORAL | 5 refills | Status: DC

## 2017-08-18 MED ORDER — LISINOPRIL-HYDROCHLOROTHIAZIDE 20-12.5 MG PO TABS: 1.0000 | ORAL_TABLET | Freq: Every day | ORAL | 1 refills | Status: DC

## 2017-08-18 NOTE — Patient Instructions (Signed)
DASH Eating Plan DASH stands for "Dietary Approaches to Stop Hypertension." The DASH eating plan is a healthy eating plan that has been shown to reduce high blood pressure (hypertension). It may also reduce your risk for type 2 diabetes, heart disease, and stroke. The DASH eating plan may also help with weight loss. What are tips for following this plan? General guidelines  Avoid eating more than 2,300 mg (milligrams) of salt (sodium) a day. If you have hypertension, you may need to reduce your sodium intake to 1,500 mg a day.  Limit alcohol intake to no more than 1 drink a day for nonpregnant women and 2 drinks a day for men. One drink equals 12 oz of beer, 5 oz of wine, or 1 oz of hard liquor.  Work with your health care provider to maintain a healthy body weight or to lose weight. Ask what an ideal weight is for you.  Get at least 30 minutes of exercise that causes your heart to beat faster (aerobic exercise) most days of the week. Activities may include walking, swimming, or biking.  Work with your health care provider or diet and nutrition specialist (dietitian) to adjust your eating plan to your individual calorie needs. Reading food labels  Check food labels for the amount of sodium per serving. Choose foods with less than 5 percent of the Daily Value of sodium. Generally, foods with less than 300 mg of sodium per serving fit into this eating plan.  To find whole grains, look for the word "whole" as the first word in the ingredient list. Shopping  Buy products labeled as "low-sodium" or "no salt added."  Buy fresh foods. Avoid canned foods and premade or frozen meals. Cooking  Avoid adding salt when cooking. Use salt-free seasonings or herbs instead of table salt or sea salt. Check with your health care provider or pharmacist before using salt substitutes.  Do not fry foods. Cook foods using healthy methods such as baking, boiling, grilling, and broiling instead.  Cook with  heart-healthy oils, such as olive, canola, soybean, or sunflower oil. Meal planning   Eat a balanced diet that includes: ? 5 or more servings of fruits and vegetables each day. At each meal, try to fill half of your plate with fruits and vegetables. ? Up to 6-8 servings of whole grains each day. ? Less than 6 oz of lean meat, poultry, or fish each day. A 3-oz serving of meat is about the same size as a deck of cards. One egg equals 1 oz. ? 2 servings of low-fat dairy each day. ? A serving of nuts, seeds, or beans 5 times each week. ? Heart-healthy fats. Healthy fats called Omega-3 fatty acids are found in foods such as flaxseeds and coldwater fish, like sardines, salmon, and mackerel.  Limit how much you eat of the following: ? Canned or prepackaged foods. ? Food that is high in trans fat, such as fried foods. ? Food that is high in saturated fat, such as fatty meat. ? Sweets, desserts, sugary drinks, and other foods with added sugar. ? Full-fat dairy products.  Do not salt foods before eating.  Try to eat at least 2 vegetarian meals each week.  Eat more home-cooked food and less restaurant, buffet, and fast food.  When eating at a restaurant, ask that your food be prepared with less salt or no salt, if possible. What foods are recommended? The items listed may not be a complete list. Talk with your dietitian about what   dietary choices are best for you. Grains Whole-grain or whole-wheat bread. Whole-grain or whole-wheat pasta. Brown rice. Oatmeal. Quinoa. Bulgur. Whole-grain and low-sodium cereals. Pita bread. Low-fat, low-sodium crackers. Whole-wheat flour tortillas. Vegetables Fresh or frozen vegetables (raw, steamed, roasted, or grilled). Low-sodium or reduced-sodium tomato and vegetable juice. Low-sodium or reduced-sodium tomato sauce and tomato paste. Low-sodium or reduced-sodium canned vegetables. Fruits All fresh, dried, or frozen fruit. Canned fruit in natural juice (without  added sugar). Meat and other protein foods Skinless chicken or turkey. Ground chicken or turkey. Pork with fat trimmed off. Fish and seafood. Egg whites. Dried beans, peas, or lentils. Unsalted nuts, nut butters, and seeds. Unsalted canned beans. Lean cuts of beef with fat trimmed off. Low-sodium, lean deli meat. Dairy Low-fat (1%) or fat-free (skim) milk. Fat-free, low-fat, or reduced-fat cheeses. Nonfat, low-sodium ricotta or cottage cheese. Low-fat or nonfat yogurt. Low-fat, low-sodium cheese. Fats and oils Soft margarine without trans fats. Vegetable oil. Low-fat, reduced-fat, or light mayonnaise and salad dressings (reduced-sodium). Canola, safflower, olive, soybean, and sunflower oils. Avocado. Seasoning and other foods Herbs. Spices. Seasoning mixes without salt. Unsalted popcorn and pretzels. Fat-free sweets. What foods are not recommended? The items listed may not be a complete list. Talk with your dietitian about what dietary choices are best for you. Grains Baked goods made with fat, such as croissants, muffins, or some breads. Dry pasta or rice meal packs. Vegetables Creamed or fried vegetables. Vegetables in a cheese sauce. Regular canned vegetables (not low-sodium or reduced-sodium). Regular canned tomato sauce and paste (not low-sodium or reduced-sodium). Regular tomato and vegetable juice (not low-sodium or reduced-sodium). Pickles. Olives. Fruits Canned fruit in a light or heavy syrup. Fried fruit. Fruit in cream or butter sauce. Meat and other protein foods Fatty cuts of meat. Ribs. Fried meat. Bacon. Sausage. Bologna and other processed lunch meats. Salami. Fatback. Hotdogs. Bratwurst. Salted nuts and seeds. Canned beans with added salt. Canned or smoked fish. Whole eggs or egg yolks. Chicken or turkey with skin. Dairy Whole or 2% milk, cream, and half-and-half. Whole or full-fat cream cheese. Whole-fat or sweetened yogurt. Full-fat cheese. Nondairy creamers. Whipped toppings.  Processed cheese and cheese spreads. Fats and oils Butter. Stick margarine. Lard. Shortening. Ghee. Bacon fat. Tropical oils, such as coconut, palm kernel, or palm oil. Seasoning and other foods Salted popcorn and pretzels. Onion salt, garlic salt, seasoned salt, table salt, and sea salt. Worcestershire sauce. Tartar sauce. Barbecue sauce. Teriyaki sauce. Soy sauce, including reduced-sodium. Steak sauce. Canned and packaged gravies. Fish sauce. Oyster sauce. Cocktail sauce. Horseradish that you find on the shelf. Ketchup. Mustard. Meat flavorings and tenderizers. Bouillon cubes. Hot sauce and Tabasco sauce. Premade or packaged marinades. Premade or packaged taco seasonings. Relishes. Regular salad dressings. Where to find more information:  National Heart, Lung, and Blood Institute: www.nhlbi.nih.gov  American Heart Association: www.heart.org Summary  The DASH eating plan is a healthy eating plan that has been shown to reduce high blood pressure (hypertension). It may also reduce your risk for type 2 diabetes, heart disease, and stroke.  With the DASH eating plan, you should limit salt (sodium) intake to 2,300 mg a day. If you have hypertension, you may need to reduce your sodium intake to 1,500 mg a day.  When on the DASH eating plan, aim to eat more fresh fruits and vegetables, whole grains, lean proteins, low-fat dairy, and heart-healthy fats.  Work with your health care provider or diet and nutrition specialist (dietitian) to adjust your eating plan to your individual   calorie needs. This information is not intended to replace advice given to you by your health care provider. Make sure you discuss any questions you have with your health care provider. Document Released: 08/29/2011 Document Revised: 09/02/2016 Document Reviewed: 09/02/2016 Elsevier Interactive Patient Education  2017 [employer]  

## 2017-08-18 NOTE — Progress Notes (Signed)
Subjective:    Patient ID: Sarah Benton, female    DOB: [birth year], 71 y.o.   MRN: [id number]  HPI   SHADEE RATHOD is here today for follow up of chronic medical problem.  Outpatient Encounter Medications as of 08/18/2017  Medication Sig  . Cholecalciferol (VITAMIN D3) 2000 UNITS capsule Take 2,000 Units by mouth daily.    . ciprofloxacin (CIPRO) 500 MG tablet Take 1 tablet (500 mg total) by mouth 2 (two) times daily.  Marland Kitchen lisinopril-hydrochlorothiazide (PRINZIDE,ZESTORETIC) 20-12.5 MG tablet Take 1 tablet by mouth daily.  . meloxicam (MOBIC) 15 MG tablet TAKE 1 TABLET DAILY  . omeprazole (PRILOSEC) 20 MG capsule Take 1 capsule (20 mg total) by mouth daily.  . rosuvastatin (CRESTOR) 20 MG tablet Take 1 tablet (20 mg total) by mouth every Monday, Wednesday, and Friday.   No facility-administered encounter medications on file as of 08/18/2017.     1. Essential hypertension, benign  No co chest pain, sob or headache. Does not check blood pressure at home. BP Readings from Last 3 Encounters:  08/18/17 127/72  08/12/17 124/66  03/07/17 (!) 112/58     2. Gastroesophageal reflux disease without esophagitis  Is on omeprazole daily- when she skips taking she gets heart burn  3. Mixed hyperlipidemia  Not watch diet at all  4. Morbid obesity ([place])  Has gained weight since her husband died in 2022/11/07. Normocytic anemia  Needs labs checked today. She says that she has no energy  6. Generalized edema  Has daily- elevating legs help  7. Vitamin D deficiency  Needs labs checked today.    New complaints: none  Social history: Lives alone since husband died   Review of Systems  Constitutional: Negative for activity change and appetite change.  HENT: Negative.   Eyes: Negative for pain.  Respiratory: Negative for shortness of breath.   Cardiovascular: Negative for chest pain, palpitations and leg swelling.  Gastrointestinal: Negative for abdominal pain.  Endocrine:  Negative for polydipsia.  Genitourinary: Negative.   Skin: Negative for rash.  Neurological: Negative for dizziness, weakness and headaches.  Hematological: Does not bruise/bleed easily.  Psychiatric/Behavioral: Negative.   All other systems reviewed and are negative.      Objective:   Physical Exam  Constitutional: She is oriented to person, place, and time. She appears well-developed and well-nourished.  HENT:  Nose: Nose normal.  Mouth/Throat: Oropharynx is clear and moist.  Eyes: EOM are normal.  Neck: Trachea normal, normal range of motion and full passive range of motion without pain. Neck supple. No JVD present. Carotid bruit is not present. No thyromegaly present.  Cardiovascular: Normal rate, regular rhythm, normal heart sounds and intact distal pulses. Exam reveals no gallop and no friction rub.  No murmur heard. Pulmonary/Chest: Effort normal and breath sounds normal.  Abdominal: Soft. Bowel sounds are normal. She exhibits no distension and no mass. There is no tenderness.  Musculoskeletal: Normal range of motion.  Lymphadenopathy:    She has no cervical adenopathy.  Neurological: She is alert and oriented to person, place, and time. She has normal reflexes.  Skin: Skin is warm and dry.  Psychiatric: She has a normal mood and affect. Her behavior is normal. Judgment and thought content normal.   BP 127/72   Pulse 76   Temp 98.1 F (36.7 C) (Oral)   Ht '5\' 3"'  (1.6 m)   Wt 231 lb (104.8 kg)   BMI 40.92 kg/m  Assessment & Plan:  1. Essential hypertension, benign Ow sodium diet - lisinopril-hydrochlorothiazide (PRINZIDE,ZESTORETIC) 20-12.5 MG tablet; Take 1 tablet by mouth daily.  Dispense: 90 tablet; Refill: 1 - CMP14+EGFR  2. Gastroesophageal reflux disease without esophagitis Avoid spicy foods Do not eat 2 hours prior to bedtime   3. Mixed hyperlipidemia Low fat diet - rosuvastatin (CRESTOR) 20 MG tablet; Take 1 tablet (20 mg total) by mouth every  Monday, Wednesday, and Friday.  Dispense: 45 tablet; Refill: 1 - Lipid panel  4. Morbid obesity ([place]) Discussed diet and exercise for person with BMI >25 Will recheck weight in 3-6 months   5. Normocytic anemia Labs pending  6. Generalized edema elevate legs when sitting  7. Vitamin D deficiency Labs pending  8. Gastroesophageal reflux disease, esophagitis presence not specified Avoid spicy foods Do not eat 2 hours prior to bedtime - omeprazole (PRILOSEC) 20 MG capsule; Take 1 capsule (20 mg total) by mouth daily.  Dispense: 30 capsule; Refill: 5    Labs pending Health maintenance reviewed Diet and exercise encouraged Continue all meds Follow up  In 6 months   [place], FNP

## 2017-08-19 ENCOUNTER — Other Ambulatory Visit: Payer: Self-pay | Admitting: Nurse Practitioner

## 2017-08-19 DIAGNOSIS — R739 Hyperglycemia, unspecified: Secondary | ICD-10-CM

## 2017-08-19 LAB — ANEMIA PROFILE B
BASOS ABS: 0 10*3/uL (ref 0.0–0.2)
Basos: 0 %
EOS (ABSOLUTE): 0.2 10*3/uL (ref 0.0–0.4)
Eos: 3 %
FOLATE: 3.1 ng/mL (ref >3.0)
Ferritin: 201 ng/mL — ABNORMAL HIGH (ref 15–150)
Hematocrit: 40.9 % (ref 34.0–46.6)
Hemoglobin: 13.6 g/dL (ref 11.1–15.9)
IRON SATURATION: 18 % (ref 15–55)
IRON: 56 ug/dL (ref 27–139)
Immature Grans (Abs): 0 10*3/uL (ref 0.0–0.1)
Immature Granulocytes: 0 %
LYMPHS ABS: 1.5 10*3/uL (ref 0.7–3.1)
Lymphs: 20 %
MCH: 28.6 pg (ref 26.6–33.0)
MCHC: 33.3 g/dL (ref 31.5–35.7)
MCV: 86 fL (ref 79–97)
MONOS ABS: 0.4 10*3/uL (ref 0.1–0.9)
Monocytes: 6 %
NEUTROS ABS: 5.2 10*3/uL (ref 1.4–7.0)
Neutrophils: 71 %
PLATELETS: 251 10*3/uL (ref 150–379)
RBC: 4.75 x10E6/uL (ref 3.77–5.28)
RDW: 13.7 % (ref 12.3–15.4)
Retic Ct Pct: 2.1 % (ref 0.6–2.6)
TIBC: 304 ug/dL (ref 250–450)
UIBC: 248 ug/dL (ref 118–369)
VITAMIN B 12: 309 pg/mL (ref 232–1245)
WBC: 7.4 10*3/uL (ref 3.4–10.8)

## 2017-08-19 LAB — CMP14+EGFR
ALK PHOS: 118 IU/L — AB (ref 39–117)
ALT: 32 IU/L (ref 0–32)
AST: 23 IU/L (ref 0–40)
Albumin/Globulin Ratio: 1.8 (ref 1.2–2.2)
Albumin: 4 g/dL (ref 3.5–4.8)
BILIRUBIN TOTAL: 0.3 mg/dL (ref 0.0–1.2)
BUN/Creatinine Ratio: 17 (ref 12–28)
BUN: 17 mg/dL (ref 8–27)
CHLORIDE: 103 mmol/L (ref 96–106)
CO2: 24 mmol/L (ref 20–29)
CREATININE: 0.98 mg/dL (ref 0.57–1.00)
Calcium: 9.5 mg/dL (ref 8.7–10.3)
GFR calc Af Amer: 66 mL/min/{1.73_m2} (ref >59)
GFR calc non Af Amer: 57 mL/min/{1.73_m2} — ABNORMAL LOW (ref >59)
GLUCOSE: 121 mg/dL — AB (ref 65–99)
Globulin, Total: 2.2 g/dL (ref 1.5–4.5)
Potassium: 4.8 mmol/L (ref 3.5–5.2)
Sodium: 142 mmol/L (ref 134–144)
Total Protein: 6.2 g/dL (ref 6.0–8.5)

## 2017-08-19 LAB — LIPID PANEL
CHOLESTEROL TOTAL: 213 mg/dL — AB (ref 100–199)
Chol/HDL Ratio: 4.5 ratio — ABNORMAL HIGH (ref 0.0–4.4)
HDL: 47 mg/dL (ref >39)
LDL Calculated: 130 mg/dL — ABNORMAL HIGH (ref 0–99)
TRIGLYCERIDES: 181 mg/dL — AB (ref 0–149)
VLDL CHOLESTEROL CAL: 36 mg/dL (ref 5–40)

## 2017-08-19 LAB — VITAMIN D 25 HYDROXY (VIT D DEFICIENCY, FRACTURES): Vit D, 25-Hydroxy: 34.7 ng/mL (ref 30.0–100.0)

## 2017-08-22 LAB — SPECIMEN STATUS REPORT

## 2017-08-22 LAB — HGB A1C W/O EAG: HEMOGLOBIN A1C: 6.6 % — AB (ref 4.8–5.6)

## 2017-09-29 ENCOUNTER — Other Ambulatory Visit: Payer: Self-pay | Admitting: Nurse Practitioner

## 2017-09-30 ENCOUNTER — Ambulatory Visit
Admission: RE | Admit: 2017-09-30 | Discharge: 2017-09-30 | Disposition: A | Discharge status: Home / Self Care | Payer: Medicare Other | Source: Ambulatory Visit | Attending: Obstetrics and Gynecology | Admitting: Obstetrics and Gynecology

## 2017-09-30 DIAGNOSIS — Z1231 Encounter for screening mammogram for malignant neoplasm of breast: Secondary | ICD-10-CM

## 2017-11-03 ENCOUNTER — Other Ambulatory Visit: Payer: Self-pay | Admitting: Nurse Practitioner

## 2017-12-01 ENCOUNTER — Other Ambulatory Visit: Payer: Self-pay | Admitting: Nurse Practitioner

## 2017-12-31 ENCOUNTER — Other Ambulatory Visit: Payer: Self-pay | Admitting: Nurse Practitioner

## 2018-02-17 ENCOUNTER — Encounter: Payer: Self-pay | Admitting: Nurse Practitioner

## 2018-02-17 ENCOUNTER — Ambulatory Visit: Payer: Medicare Other | Admitting: Nurse Practitioner

## 2018-02-17 VITALS — BP 95/65 | HR 78 | Temp 98.7°F | Ht 63.0 in | Wt 216.0 lb

## 2018-02-17 DIAGNOSIS — K219 Gastro-esophageal reflux disease without esophagitis: Secondary | ICD-10-CM

## 2018-02-17 DIAGNOSIS — E782 Mixed hyperlipidemia: Secondary | ICD-10-CM | POA: Diagnosis not present

## 2018-02-17 DIAGNOSIS — D649 Anemia, unspecified: Secondary | ICD-10-CM | POA: Diagnosis not present

## 2018-02-17 DIAGNOSIS — R739 Hyperglycemia, unspecified: Secondary | ICD-10-CM

## 2018-02-17 DIAGNOSIS — E559 Vitamin D deficiency, unspecified: Secondary | ICD-10-CM

## 2018-02-17 DIAGNOSIS — I10 Essential (primary) hypertension: Secondary | ICD-10-CM

## 2018-02-17 LAB — CBC WITH DIFFERENTIAL/PLATELET
BASOS: 0 %
Basophils Absolute: 0 10*3/uL (ref 0.0–0.2)
EOS (ABSOLUTE): 0.1 10*3/uL (ref 0.0–0.4)
EOS: 2 %
HEMATOCRIT: 40 % (ref 34.0–46.6)
HEMOGLOBIN: 13.2 g/dL (ref 11.1–15.9)
IMMATURE GRANS (ABS): 0 10*3/uL (ref 0.0–0.1)
IMMATURE GRANULOCYTES: 0 %
LYMPHS: 19 %
Lymphocytes Absolute: 1.6 10*3/uL (ref 0.7–3.1)
MCH: 28.3 pg (ref 26.6–33.0)
MCHC: 33 g/dL (ref 31.5–35.7)
MCV: 86 fL (ref 79–97)
MONOCYTES: 7 %
Monocytes Absolute: 0.6 10*3/uL (ref 0.1–0.9)
NEUTROS PCT: 72 %
Neutrophils Absolute: 6 10*3/uL (ref 1.4–7.0)
Platelets: 296 10*3/uL (ref 150–450)
RBC: 4.66 x10E6/uL (ref 3.77–5.28)
RDW: 13.5 % (ref 12.3–15.4)
WBC: 8.3 10*3/uL (ref 3.4–10.8)

## 2018-02-17 LAB — BAYER DCA HB A1C WAIVED: HB A1C: 6 % (ref <7.0)

## 2018-02-17 MED ORDER — ROSUVASTATIN CALCIUM 20 MG PO TABS: 20.0000 mg | ORAL_TABLET | ORAL | 1 refills | Status: DC

## 2018-02-17 MED ORDER — MELOXICAM 15 MG PO TABS: 15.0000 mg | ORAL_TABLET | Freq: Every day | ORAL | 1 refills | Status: DC

## 2018-02-17 MED ORDER — LISINOPRIL-HYDROCHLOROTHIAZIDE 20-12.5 MG PO TABS: 1.0000 | ORAL_TABLET | Freq: Every day | ORAL | 1 refills | Status: DC

## 2018-02-17 MED ORDER — OMEPRAZOLE 20 MG PO CPDR: 20.0000 mg | DELAYED_RELEASE_CAPSULE | Freq: Every day | ORAL | 5 refills | Status: DC

## 2018-02-17 NOTE — Patient Instructions (Signed)
Bone Health Bones protect organs, store calcium, and anchor muscles. Good health habits, such as eating nutritious foods and exercising regularly, are important for maintaining healthy bones. They can also help to prevent a condition that causes bones to lose density and become weak and brittle (osteoporosis). Why is bone mass important? Bone mass refers to the amount of bone tissue that you have. The higher your bone mass, the stronger your bones. An important step toward having healthy bones throughout life is to have strong and dense bones during childhood. A young adult who has a high bone mass is more likely to have a high bone mass later in life. Bone mass at its greatest it is called peak bone mass. A large decline in bone mass occurs in older adults. In women, it occurs about the time of menopause. During this time, it is important to practice good health habits, because if more bone is lost than what is replaced, the bones will become less healthy and more likely to break (fracture). If you find that you have a low bone mass, you may be able to prevent osteoporosis or further bone loss by changing your diet and lifestyle. How can I find out if my bone mass is low? Bone mass can be measured with an X-ray test that is called a bone mineral density (BMD) test. This test is recommended for all women who are age 65 or older. It may also be recommended for men who are age 70 or older, or for people who are more likely to develop osteoporosis due to:  Having bones that break easily.  Having a long-term disease that weakens bones, such as kidney disease or rheumatoid arthritis.  Having menopause earlier than normal.  Taking medicine that weakens bones, such as steroids, thyroid hormones, or hormone treatment for breast cancer or prostate cancer.  Smoking.  Drinking three or more alcoholic drinks each day.  What are the nutritional recommendations for healthy bones? To have healthy bones, you  need to get enough of the right minerals and vitamins. Most nutrition experts recommend getting these nutrients from the foods that you eat. Nutritional recommendations vary from person to person. Ask your health care provider what is healthy for you. Here are some general guidelines. Calcium Recommendations Calcium is the most important (essential) mineral for bone health. Most people can get enough calcium from their diet, but supplements may be recommended for people who are at risk for osteoporosis. Good sources of calcium include:  Dairy products, such as low-fat or nonfat milk, cheese, and yogurt.  Dark green leafy vegetables, such as bok choy and broccoli.  Calcium-fortified foods, such as orange juice, cereal, bread, soy beverages, and tofu products.  Nuts, such as almonds.  Follow these recommended amounts for daily calcium intake:  Children, age 1?3: 700 mg.  Children, age 4?8: 1,000 mg.  Children, age 9?13: 1,300 mg.  Teens, age 14?18: 1,300 mg.  Adults, age 19?50: 1,000 mg.  Adults, age 51?70: ? Men: 1,000 mg. ? Women: 1,200 mg.  Adults, age 71 or older: 1,200 mg.  Pregnant and breastfeeding females: ? Teens: 1,300 mg. ? Adults: 1,000 mg.  Vitamin D Recommendations Vitamin D is the most essential vitamin for bone health. It helps the body to absorb calcium. Sunlight stimulates the skin to make vitamin D, so be sure to get enough sunlight. If you live in a cold climate or you do not get outside often, your health care provider may recommend that you take vitamin   D supplements. Good sources of vitamin D in your diet include:  Egg yolks.  Saltwater fish.  Milk and cereal fortified with vitamin D.  Follow these recommended amounts for daily vitamin D intake:  Children and teens, age 1?18: 600 international units.  Adults, age 50 or younger: 400-800 international units.  Adults, age 51 or older: 800-1,000 international units.  Other Nutrients Other nutrients  for bone health include:  Phosphorus. This mineral is found in meat, poultry, dairy foods, nuts, and legumes. The recommended daily intake for adult men and adult women is 700 mg.  Magnesium. This mineral is found in seeds, nuts, dark green vegetables, and legumes. The recommended daily intake for adult men is 400?420 mg. For adult women, it is 310?320 mg.  Vitamin K. This vitamin is found in green leafy vegetables. The recommended daily intake is 120 mg for adult men and 90 mg for adult women.  What type of physical activity is best for building and maintaining healthy bones? Weight-bearing and strength-building activities are important for building and maintaining peak bone mass. Weight-bearing activities cause muscles and bones to work against gravity. Strength-building activities increases muscle strength that supports bones. Weight-bearing and muscle-building activities include:  Walking and hiking.  Jogging and running.  Dancing.  Gym exercises.  Lifting weights.  Tennis and racquetball.  Climbing stairs.  Aerobics.  Adults should get at least 30 minutes of moderate physical activity on most days. Children should get at least 60 minutes of moderate physical activity on most days. Ask your health care provide what type of exercise is best for you. Where can I find more information? For more information, check out the following websites:  National Osteoporosis Foundation: http://nof.org/learn/basics  National Institutes of Health: http://www.niams.nih.gov/Health_Info/Bone/Bone_Health/bone_health_for_life.asp  This information is not intended to replace advice given to you by your health care provider. Make sure you discuss any questions you have with your health care provider. Document Released: 11/30/2003 Document Revised: 03/29/2016 Document Reviewed: 09/14/2014 Elsevier Interactive Patient Education  2018 Elsevier Inc.  

## 2018-02-17 NOTE — Addendum Note (Signed)
Addended by: Chevis Pretty on: 02/17/2018 10:38 AM   Modules accepted: Orders

## 2018-02-17 NOTE — Progress Notes (Signed)
Subjective:    Patient ID: Sarah Benton, female    DOB: 07/19/1946, 72 y.o.   MRN: [id number]   Chief Complaint: Medical Management of Chronic Issues   HPI:  1. Essential hypertension, benign  No c/o chest pain, sob or headache. Does not check blood pressure at home. BP Readings from Last 3 Encounters:  02/17/18 95/65  08/18/17 127/72  08/12/17 124/66     2. Hyperglycemia  Doe snot check blood sugars at home. Blood sugar was elevated at last visit and hgba1c was 6.6. She is currently not on any meds for this.  3. Mixed hyperlipidemia  Does not watch diet. Very little exercise  4. Gastroesophageal reflux disease without esophagitis  Takes omerpazole daily. Keeps symptoms under control  5. Vitamin D deficiency  Takes daily supplement  6. Normocytic anemia  No c/o fatigue. It is time to check hgb today.  7. Morbid obesity ([place])  Weight down 15 lbs from last check    Outpatient Encounter Medications as of 02/17/2018  Medication Sig  . Cholecalciferol (VITAMIN D3) 2000 UNITS capsule Take 2,000 Units by mouth daily.    Marland Kitchen lisinopril-hydrochlorothiazide (PRINZIDE,ZESTORETIC) 20-12.5 MG tablet Take 1 tablet by mouth daily.  . meloxicam (MOBIC) 15 MG tablet TAKE 1 TABLET DAILY  . omeprazole (PRILOSEC) 20 MG capsule Take 1 capsule (20 mg total) by mouth daily.  . rosuvastatin (CRESTOR) 20 MG tablet Take 1 tablet (20 mg total) by mouth every Monday, Wednesday, and Friday.       New complaints: None today  Social history: Lives by herself- husband passed away last year. Has family close by that she sees often.    Review of Systems  Constitutional: Negative for activity change and appetite change.  HENT: Negative.   Eyes: Negative for pain.  Respiratory: Negative for shortness of breath.   Cardiovascular: Negative for chest pain, palpitations and leg swelling.  Gastrointestinal: Negative for abdominal pain.  Endocrine: Negative for polydipsia.  Genitourinary:  Negative.   Skin: Negative for rash.  Neurological: Negative for dizziness, weakness and headaches.  Hematological: Does not bruise/bleed easily.  Psychiatric/Behavioral: Negative.   All other systems reviewed and are negative.      Objective:   Physical Exam  Constitutional: She is oriented to person, place, and time.  HENT:  Head: Normocephalic.  Nose: Nose normal.  Mouth/Throat: Oropharynx is clear and moist.  Eyes: Pupils are equal, round, and reactive to light. EOM are normal.  Neck: Normal range of motion. Neck supple. No JVD present. Carotid bruit is not present.  Cardiovascular: Normal rate, regular rhythm, normal heart sounds and intact distal pulses.  Pulmonary/Chest: Effort normal and breath sounds normal. No respiratory distress. She has no wheezes. She has no rales. She exhibits no tenderness.  Abdominal: Soft. Normal appearance, normal aorta and bowel sounds are normal. She exhibits no distension, no abdominal bruit, no pulsatile midline mass and no mass. There is no splenomegaly or hepatomegaly. There is no tenderness.  Musculoskeletal: Normal range of motion. She exhibits edema (1+ edema bil lower ext).  Lymphadenopathy:    She has no cervical adenopathy.  Neurological: She is alert and oriented to person, place, and time. She has normal reflexes.  Skin: Skin is warm and dry.  Psychiatric: She has a normal mood and affect. Her behavior is normal. Judgment and thought content normal.  Nursing note and vitals reviewed.  BP 95/65   Pulse 78   Temp 98.7 F (37.1 C) (Oral)   Ht 5'  3" (1.6 m)   Wt 216 lb (98 kg)   BMI 38.26 kg/m   hgba1c 6.0%    Assessment & Plan:  Sarah Benton comes in today with chief complaint of Medical Management of Chronic Issues   Diagnosis and orders addressed:  1. Essential hypertension, benign Low sodium diet - CMP14+EGFR - lisinopril-hydrochlorothiazide (PRINZIDE,ZESTORETIC) 20-12.5 MG tablet; Take 1 tablet by mouth daily.   Dispense: 90 tablet; Refill: 1  2. Hyperglycemia Watch carbs in diet - Bayer DCA Hb A1c Waived  3. Mixed hyperlipidemia Low fat det - Lipid panel - rosuvastatin (CRESTOR) 20 MG tablet; Take 1 tablet (20 mg total) by mouth every Monday, Wednesday, and Friday.  Dispense: 45 tablet; Refill: 1  4. Gastroesophageal reflux disease without esophagitis Avoid spicy foods Do not eat 2 hours prior to bedtime - omeprazole (PRILOSEC) 20 MG capsule; Take 1 capsule (20 mg total) by mouth daily.  Dispense: 30 capsule; Refill: 5   5. Vitamin D deficiency Continue daily vitamin d suplements  6. Normocytic anemia Labs pending - CBC with Differential/Platelet  7. Morbid obesity ([place]) Discussed diet and exercise for person with BMI >25 Will recheck weight in 3-6 months   Labs pending Health Maintenance reviewed Diet and exercise encouraged  Follow up plan: 3 months   [name], FNP

## 2018-02-18 LAB — LIPID PANEL
CHOLESTEROL TOTAL: 179 mg/dL (ref 100–199)
Chol/HDL Ratio: 3.8 ratio (ref 0.0–4.4)
HDL: 47 mg/dL (ref >39)
LDL CALC: 102 mg/dL — AB (ref 0–99)
Triglycerides: 148 mg/dL (ref 0–149)
VLDL Cholesterol Cal: 30 mg/dL (ref 5–40)

## 2018-02-18 LAB — CMP14+EGFR
A/G RATIO: 1.7 (ref 1.2–2.2)
ALK PHOS: 123 IU/L — AB (ref 39–117)
ALT: 25 IU/L (ref 0–32)
AST: 21 IU/L (ref 0–40)
Albumin: 4 g/dL (ref 3.5–4.8)
BUN/Creatinine Ratio: 17 (ref 12–28)
BUN: 19 mg/dL (ref 8–27)
Bilirubin Total: 0.5 mg/dL (ref 0.0–1.2)
CALCIUM: 9.6 mg/dL (ref 8.7–10.3)
CO2: 24 mmol/L (ref 20–29)
CREATININE: 1.1 mg/dL — AB (ref 0.57–1.00)
Chloride: 103 mmol/L (ref 96–106)
GFR calc Af Amer: 57 mL/min/{1.73_m2} — ABNORMAL LOW (ref >59)
GFR, EST NON AFRICAN AMERICAN: 50 mL/min/{1.73_m2} — AB (ref >59)
Globulin, Total: 2.3 g/dL (ref 1.5–4.5)
Glucose: 99 mg/dL (ref 65–99)
POTASSIUM: 4.7 mmol/L (ref 3.5–5.2)
SODIUM: 141 mmol/L (ref 134–144)
Total Protein: 6.3 g/dL (ref 6.0–8.5)

## 2018-05-22 ENCOUNTER — Ambulatory Visit: Payer: Medicare Other | Admitting: Nurse Practitioner

## 2018-06-02 ENCOUNTER — Encounter: Payer: Self-pay | Admitting: *Deleted

## 2018-06-02 ENCOUNTER — Ambulatory Visit (INDEPENDENT_AMBULATORY_CARE_PROVIDER_SITE_OTHER): Payer: Medicare Other | Admitting: *Deleted

## 2018-06-02 VITALS — BP 114/61 | HR 57 | Ht 62.25 in | Wt 219.0 lb

## 2018-06-02 DIAGNOSIS — Z0000 Encounter for general adult medical examination without abnormal findings: Secondary | ICD-10-CM | POA: Diagnosis not present

## 2018-06-02 NOTE — Patient Instructions (Signed)
  Ms. Hollis , Thank you for taking time to come for your Medicare Wellness Visit. I appreciate your ongoing commitment to your health goals. Please review the following plan we discussed and let me know if I can assist you in the future.   These are the goals we discussed: Goals    . Exercise 150 min/wk Moderate Activity       This is a list of the screening recommended for you and due dates:  Health Maintenance  Topic Date Due  . Flu Shot  04/23/2018  . Mammogram  09/30/2018  . DEXA scan (bone density measurement)  01/10/2020  . Tetanus Vaccine  06/06/2021  .  Hepatitis C: One time screening is recommended by Center for Disease Control  (CDC) for  adults born from [birth year] through 1965.   Completed  . Pneumonia vaccines  Completed

## 2018-06-02 NOTE — Progress Notes (Addendum)
Subjective:   Sarah Benton is a 72 y.o. female who presents for a Medicare Annual Wellness Visit. Aamina lives at home alone since her husband passed away from vascular dementia last year. She has an adult son that lives next door and an adult daughter that lives about 15 minutes away. She talks and visits with them often. She also has a granddaughter that she drops off and picks up from school daily. She has a group of friends that were past coworkers that she meets for lunch monthly and another group that she meets for breakfast on Fridays.     Review of Systems    Patient reports that her overall health is unchanged compared to last year.  Cardiac Risk Factors include: advanced age (>26mn, >>73women);dyslipidemia;hypertension;sedentary lifestyle;obesity (BMI >30kg/m2)  Musculoskeletal: joint pains from arthritis  All other systems negative       Current Medications (verified) Outpatient Encounter Medications as of 06/02/2018  Medication Sig  . Cholecalciferol (VITAMIN D3) 2000 UNITS capsule Take 2,000 Units by mouth daily.    .Marland Kitchenlisinopril-hydrochlorothiazide (PRINZIDE,ZESTORETIC) 20-12.5 MG tablet Take 1 tablet by mouth daily.  . meloxicam (MOBIC) 15 MG tablet Take 1 tablet (15 mg total) by mouth daily.  .Marland Kitchenomeprazole (PRILOSEC) 20 MG capsule Take 1 capsule (20 mg total) by mouth daily.  . rosuvastatin (CRESTOR) 20 MG tablet Take 1 tablet (20 mg total) by mouth every Monday, Wednesday, and Friday.   No facility-administered encounter medications on file as of 06/02/2018.     Allergies (verified) Amoxicillin; Septra [sulfamethoxazole-trimethoprim]; and Atorvastatin   History: Past Medical History:  Diagnosis Date  . Anemia    Iron def  . Arthritis   . Cataract   . Edema   . Esophageal stricture   . GERD (gastroesophageal reflux disease)   . Hyperlipidemia   . Hypertension   . Obesity   . Ruptured lumbar disc    Past Surgical History:  Procedure Laterality Date   . ABDOMINAL HYSTERECTOMY Bilateral 2001   complete  . BREAST BIOPSY  1993   Left/negative  . BREAST CYST EXCISION Left   . LUMBAR LAMINECTOMY  1998   ruptured disc  . TONSILLECTOMY    . TUBAL LIGATION  1979   Family History  Problem Relation Age of Onset  . Parkinsonism Mother 741 . Ovarian cancer Mother   . Hypertension Father   . Prostate cancer Father 962      met to bone / hip  . Arthritis Sister        knee  . Diabetes Daughter   . Diabetes Son        type 2  . Diabetes Grandchild 17       type 1  . Colon cancer Neg Hx   . Breast cancer Neg Hx    Social History   Socioeconomic History  . Marital status: Widowed    Spouse name: Not on file  . Number of children: 2  . Years of education: 127 . Highest education level: 12th grade  Occupational History  . Occupation: Retired    Comment: SChief of Staff . Financial resource strain: Not hard at all  . Food insecurity:    Worry: Never true    Inability: Never true  . Transportation needs:    Medical: No    Non-medical: No  Tobacco Use  . Smoking status: Never Smoker  . Smokeless tobacco: Never Used  Substance and Sexual Activity  .  Alcohol use: No    Alcohol/week: 0.0 standard drinks  . Drug use: No  . Sexual activity: Not Currently  Lifestyle  . Physical activity:    Days per week: 0 days    Minutes per session: 0 min  . Stress: Only a little  Relationships  . Social connections:    Talks on phone: More than three times a week    Gets together: More than three times a week    Attends religious service: Never    Active member of club or organization: No    Attends meetings of clubs or organizations: Never    Relationship status: Widowed  Other Topics Concern  . Not on file  Social History Narrative  . Not on file    Tobacco Use No.  Clinical Intake:     Pain : No/denies pain     Nutritional Status: BMI > 30  Obese Diabetes: No  How often do you need to have someone  help you when you read instructions, pamphlets, or other written materials from your doctor or pharmacy?: 1 - Never What is the last grade level you completed in school?: 12     Information entered by :: Chong Sicilian, RN   Activities of Daily Living In your present state of health, do you have any difficulty performing the following activities: 06/02/2018  Hearing? N  Vision? N  Comment has yearly eye exams  Difficulty concentrating or making decisions? N  Walking or climbing stairs? N  Dressing or bathing? N  Doing errands, shopping? N  Preparing Food and eating ? N  Using the Toilet? N  In the past six months, have you accidently leaked urine? Y  Comment Has some mild urge inctoninence when she gets up at night  Do you have problems with loss of bowel control? N  Managing your Medications? N  Managing your Finances? N  Housekeeping or managing your Housekeeping? N  Some recent data might be hidden    Diet Skips breakfast. Has lunch and supper. Combination of eating out and preparing foods at home. Snacks. Trying to eat less snacks and sugar. Drinks water and 16oz  caffeine free diet pepsi once a day.   Exercise Current Exercise Habits: The patient does not participate in regular exercise at present, Exercise limited by: orthopedic condition(s)(back pain)   Depression Screen PHQ 2/9 Scores 06/02/2018 02/17/2018 08/18/2017 08/12/2017 03/07/2017 02/11/2017 12/16/2016  PHQ - 2 Score 0 0 0 0 0 0 0     Fall Risk Fall Risk  06/02/2018 02/17/2018 08/18/2017 03/07/2017 02/11/2017  Falls in the past year? _0     Safety Is the patient's home free of loose throw rugs in walkways, pet beds, electrical cords, etc?   yes      Grab bars in the bathroom? no      Walkin shower? no      Shower Seat? no      Handrails on the stairs?   yes      Adequate lighting?   yes  Patient Care Team: Chevis Pretty, FNP as PCP - General (Nurse Practitioner) Newton Pigg, MD as  Consulting Physician (Obstetrics and Gynecology) Ladene Artist, MD as Consulting Physician (Gastroenterology) Steffanie Rainwater, DPM as Consulting Physician (Podiatry)  Hospitalizations, surgeries, and ER visits in previous 12 months No hospitalizations, ER visits, or surgeries this past year.  Objective:    Today's Vitals   06/02/18 0844  BP: 114/61  Pulse: (!) 57  Weight:  219 lb (99.3 kg)  Height: 5' 2.25" (1.581 m)   Body mass index is 39.73 kg/m.  Advanced Directives 06/02/2018 05/21/2016 01/10/2015 07/25/2014 06/13/2013  Does Patient Have a Medical Advance Directive? Yes Yes Yes Yes Patient does not have advance directive  Type of Advance Directive [place];Living will [place];Living will Living will Living will -  Does patient want to make changes to medical advance directive? No - Patient declined - - - -  Copy of [place] in Chart? No - copy requested No - copy requested No - copy requested - -  Pre-existing out of facility DNR order (yellow form or pink MOST form) - - - - No    Hearing/Vision  No hearing or vision deficits noted during visit.  Cognitive Function: MMSE - Mini Mental State Exam 06/02/2018 05/21/2016 01/10/2015  Orientation to time _0 Orientation to Place _1 Registration _2 Attention/ Calculation _3 Recall _4 Language- name 2 objects _5 Language- repeat 1 - 1  Language- follow 3 step command 3 - 3  Language- read & follow direction 1 - 1  Write a sentence 1 - 1  Copy design 1 - 1  Total score 30 - 30       Normal Cognitive Function Screening: Yes    Immunizations and Health Maintenance Immunization History  Administered Date(s) Administered  . Influenza, High Dose Seasonal PF 08/13/2016, 08/18/2017  . Influenza,inj,Quad PF,6+ Mos 07/25/2014, 07/28/2015  . Pneumococcal Conjugate-13 07/25/2014  . Pneumococcal Polysaccharide-23 06/07/2011  . Tdap 06/07/2011  . Zoster  01/25/2015   Health Maintenance Due  Topic Date Due  . INFLUENZA VACCINE  04/23/2018   Health Maintenance  Topic Date Due  . INFLUENZA VACCINE  04/23/2018  . MAMMOGRAM  09/30/2018  . DEXA SCAN  01/10/2020  . TETANUS/TDAP  06/06/2021  . Hepatitis C Screening  Completed  . PNA vac Low Risk Adult  Completed        Assessment:   This is a routine wellness examination for [place].    Plan:    Goals    . Exercise 150 min/wk Moderate Activity        Health Maintenance Recommendations: Influenza vaccine    Additional Screening Recommendations: Lung: Low Dose CT Chest recommended if Age 55-80 years, 30 pack-year currently smoking OR have quit w/in 15years. Patient does not qualify. Hepatitis C Screening recommended: no  Keep f/u with Chevis Pretty, FNP and any other specialty appointments you may have Continue current medications Move carefully to avoid falls. Use assistive devices like a cane or walker if needed. Increase activity level slowly. Start with walking 10 or 15 minutes a day with a goal of 150 minutes a week. Can premedicate with extra strength Tylenol to help with back pain. Physical therapy may be an option to help strengthen back and core muscles and decrease back pain.  Read or work on puzzles daily Stay connected with friends and family  I have personally reviewed and noted the following in the patient's chart:   . Medical and social history . Use of alcohol, tobacco or illicit drugs  . Current medications and supplements . Functional ability and status . Nutritional status . Physical activity . Advanced directives . List of other physicians . Hospitalizations, surgeries, and ER visits in previous 12 months . Vitals . Screenings to include cognitive, depression, and falls .  Referrals and appointments  In addition, I have reviewed and discussed with patient certain preventive protocols, quality metrics, and best practice recommendations. A  written personalized care plan for preventive services as well as general preventive health recommendations were provided to patient.     Chong Sicilian, RN   06/02/2018   I have reviewed and agree with the above AWV documentation.   [name], FNP

## 2018-06-22 ENCOUNTER — Other Ambulatory Visit: Payer: Self-pay | Admitting: Nurse Practitioner

## 2018-07-21 ENCOUNTER — Other Ambulatory Visit: Payer: Self-pay | Admitting: Nurse Practitioner

## 2018-07-21 NOTE — Telephone Encounter (Signed)
Last seen 02/17/18  MMM

## 2018-08-11 ENCOUNTER — Ambulatory Visit: Payer: Medicare Other | Admitting: Nurse Practitioner

## 2018-08-11 ENCOUNTER — Encounter: Payer: Self-pay | Admitting: Nurse Practitioner

## 2018-08-11 VITALS — BP 114/63 | HR 72 | Temp 97.3°F | Ht 62.0 in | Wt 219.0 lb

## 2018-08-11 DIAGNOSIS — E782 Mixed hyperlipidemia: Secondary | ICD-10-CM | POA: Diagnosis not present

## 2018-08-11 DIAGNOSIS — D649 Anemia, unspecified: Secondary | ICD-10-CM

## 2018-08-11 DIAGNOSIS — R739 Hyperglycemia, unspecified: Secondary | ICD-10-CM

## 2018-08-11 DIAGNOSIS — I10 Essential (primary) hypertension: Secondary | ICD-10-CM | POA: Diagnosis not present

## 2018-08-11 DIAGNOSIS — R601 Generalized edema: Secondary | ICD-10-CM

## 2018-08-11 DIAGNOSIS — K219 Gastro-esophageal reflux disease without esophagitis: Secondary | ICD-10-CM

## 2018-08-11 DIAGNOSIS — Z23 Encounter for immunization: Secondary | ICD-10-CM | POA: Diagnosis not present

## 2018-08-11 LAB — BAYER DCA HB A1C WAIVED: HB A1C (BAYER DCA - WAIVED): 6.2 % (ref <7.0)

## 2018-08-11 MED ORDER — MELOXICAM 15 MG PO TABS: 15.0000 mg | ORAL_TABLET | Freq: Every day | ORAL | 2 refills | Status: DC

## 2018-08-11 MED ORDER — ROSUVASTATIN CALCIUM 20 MG PO TABS: 20.0000 mg | ORAL_TABLET | ORAL | 1 refills | Status: DC

## 2018-08-11 MED ORDER — OMEPRAZOLE 20 MG PO CPDR: 20.0000 mg | DELAYED_RELEASE_CAPSULE | Freq: Every day | ORAL | 1 refills | Status: DC

## 2018-08-11 MED ORDER — LISINOPRIL-HYDROCHLOROTHIAZIDE 20-12.5 MG PO TABS: 1.0000 | ORAL_TABLET | Freq: Every day | ORAL | 1 refills | Status: DC

## 2018-08-11 NOTE — Progress Notes (Signed)
Subjective:    Patient ID: Sarah Benton, female    DOB: Feb 08, 1946, 72 y.o.   MRN: [id number]   Chief Complaint: Medical Management of Chronic Issues   HPI:  1. Essential hypertension, benign  -does not check her BP at home -no c/o CP/SOB/HA -does not watch salt intake in diet BP Readings from Last 3 Encounters:  08/11/18 114/63  06/02/18 114/61  02/17/18 95/65     2. Mixed hyperlipidemia  -does not watch fat intake in diet -tolerating Crestor well 3x/week, otherwise gets leg cramps  3. Hyperglycemia  -has been watching her carbs -lost 15lbs, but gained 5lbs back  4. Gastroesophageal reflux disease without esophagitis  -symptoms controlled on Prilosec  5. Generalized edema  -does not wear compression stockings -no issues  6. Normocytic anemia  -no issues and not fatigued    Outpatient Encounter Medications as of 08/11/2018  Medication Sig  . Cholecalciferol (VITAMIN D3) 2000 UNITS capsule Take 2,000 Units by mouth daily.    Marland Kitchen lisinopril-hydrochlorothiazide (PRINZIDE,ZESTORETIC) 20-12.5 MG tablet Take 1 tablet by mouth daily.  . meloxicam (MOBIC) 15 MG tablet TAKE 1 TABLET EVERY DAY  . omeprazole (PRILOSEC) 20 MG capsule Take 1 capsule (20 mg total) by mouth daily.  . rosuvastatin (CRESTOR) 20 MG tablet Take 1 tablet (20 mg total) by mouth every Monday, Wednesday, and Friday.   No facility-administered encounter medications on file as of 08/11/2018.     New complaints: None today  Social history: Just got back from [place] in July    Review of Systems  Constitutional: Negative for activity change, appetite change, chills, fatigue, fever and unexpected weight change.  HENT: Negative for congestion, ear pain, rhinorrhea, sinus pressure, sinus pain and sore throat.   Eyes: Negative for pain, redness and visual disturbance.  Respiratory: Negative for cough, chest tightness, shortness of breath and wheezing.   Cardiovascular: Negative for chest pain,  palpitations and leg swelling.  Gastrointestinal: Negative for abdominal pain, constipation, diarrhea, nausea and vomiting.  Endocrine: Negative for cold intolerance, heat intolerance, polydipsia, polyphagia and polyuria.  Genitourinary: Negative for difficulty urinating, dysuria and urgency.  Musculoskeletal: Positive for arthralgias (generalized) and back pain (bone spurs in back). Negative for gait problem, joint swelling and myalgias.  Skin: Negative for rash and wound.  Allergic/Immunologic: Negative for environmental allergies and food allergies.  Neurological: Negative for dizziness, tremors, weakness and numbness.  Hematological: Does not bruise/bleed easily.  Psychiatric/Behavioral: Negative for behavioral problems, confusion, decreased concentration, sleep disturbance and suicidal ideas. The patient is not nervous/anxious.        Objective:   Physical Exam  Constitutional: She is oriented to person, place, and time. She appears well-developed and well-nourished.  HENT:  Head: Normocephalic and atraumatic.  Right Ear: External ear normal.  Left Ear: External ear normal.  Nose: Nose normal.  Mouth/Throat: Oropharynx is clear and moist. No oropharyngeal exudate.  Eyes: Pupils are equal, round, and reactive to light. Conjunctivae and EOM are normal.  Neck: Normal range of motion. Neck supple. No thyromegaly present.  Cardiovascular: Normal rate, regular rhythm, normal heart sounds and intact distal pulses.  Pulmonary/Chest: Effort normal and breath sounds normal.  Abdominal: Soft. Bowel sounds are normal.  rounded  Musculoskeletal: Normal range of motion.  Neurological: She is alert and oriented to person, place, and time. She displays normal reflexes. No cranial nerve deficit.  Skin: Skin is warm and dry.  Psychiatric: She has a normal mood and affect. Her behavior is normal. Judgment and  thought content normal.  Nursing note and vitals reviewed.   BP 114/63   Pulse 72    Temp (!) 97.3 F (36.3 C) (Oral)   Ht 5\' 2"  (1.575 m)   Wt 219 lb (99.3 kg)   BMI 40.06 kg/m   A1C today    Assessment & Plan:  Sarah Benton comes in today with chief complaint of Medical Management of Chronic Issues   Diagnosis and orders addressed:  1. Essential hypertension, benign -check BP at home -CMP14+GFR -low salt diet - lisinopril-hydrochlorothiazide (PRINZIDE,ZESTORETIC) 20-12.5 MG tablet; Take 1 tablet by mouth daily.  Dispense: 90 tablet; Refill: 1  2. Mixed hyperlipidemia -Lipid Panel - Low fat diet - rosuvastatin (CRESTOR) 20 MG tablet; Take 1 tablet (20 mg total) by mouth every Monday, Wednesday, and Friday.  Dispense: 45 tablet; Refill: 1  3. Hyperglycemia -watch carbs and sweets - Bayer DCA Hb A1c Waived  4. Gastroesophageal reflux disease without esophagitis Avoid spicy foods Do not eat 2 hours prior to bedtime - omeprazole (PRILOSEC) 20 MG capsule; Take 1 capsule (20 mg total) by mouth daily.  Dispense: 90 capsule; Refill: 1  5. Generalized edema -compression socks as needed -elevate legs when sitting  6. Normocytic anemia    Labs pending Health Maintenance reviewed Diet and exercise encouraged  Follow up plan: 6 months   [place], FNP

## 2018-08-11 NOTE — Patient Instructions (Signed)
Food Choices for Gastroesophageal Reflux Disease, Adult When you have gastroesophageal reflux disease (GERD), the foods you eat and your eating habits are very important. Choosing the right foods can help ease your discomfort. What guidelines do I need to follow?  Choose fruits, vegetables, whole grains, and low-fat dairy products.  Choose low-fat meat, fish, and poultry.  Limit fats such as oils, salad dressings, butter, nuts, and avocado.  Keep a food diary. This helps you identify foods that cause symptoms.  Avoid foods that cause symptoms. These may be different for everyone.  Eat small meals often instead of 3 large meals a day.  Eat your meals slowly, in a place where you are relaxed.  Limit fried foods.  Cook foods using methods other than frying.  Avoid drinking alcohol.  Avoid drinking large amounts of liquids with your meals.  Avoid bending over or lying down until 2-3 hours after eating. What foods are not recommended? These are some foods and drinks that may make your symptoms worse: Vegetables  Tomatoes. Tomato juice. Tomato and spaghetti sauce. Chili peppers. Onion and garlic. Horseradish. Fruits  Oranges, grapefruit, and lemon (fruit and juice). Meats  High-fat meats, fish, and poultry. This includes hot dogs, ribs, ham, sausage, salami, and bacon. Dairy  Whole milk and chocolate milk. Sour cream. Cream. Butter. Ice cream. Cream cheese. Drinks  Coffee and tea. Bubbly (carbonated) drinks or energy drinks. Condiments  Hot sauce. Barbecue sauce. Sweets/Desserts  Chocolate and cocoa. Donuts. Peppermint and spearmint. Fats and Oils  High-fat foods. This includes French fries and potato chips. Other  Vinegar. Strong spices. This includes black pepper, white pepper, red pepper, cayenne, curry powder, cloves, ginger, and chili powder. The items listed above may not be a complete list of foods and drinks to avoid. Contact your dietitian for more information.    This information is not intended to replace advice given to you by your health care provider. Make sure you discuss any questions you have with your health care provider. Document Released: 03/10/2012 Document Revised: 02/15/2016 Document Reviewed: 07/14/2013 Elsevier Interactive Patient Education  2017 Elsevier Inc.  

## 2018-08-12 LAB — CMP14+EGFR
ALK PHOS: 112 IU/L (ref 39–117)
ALT: 30 IU/L (ref 0–32)
AST: 23 IU/L (ref 0–40)
Albumin/Globulin Ratio: 1.9 (ref 1.2–2.2)
Albumin: 4 g/dL (ref 3.5–4.8)
BILIRUBIN TOTAL: 0.3 mg/dL (ref 0.0–1.2)
BUN/Creatinine Ratio: 20 (ref 12–28)
BUN: 21 mg/dL (ref 8–27)
CHLORIDE: 102 mmol/L (ref 96–106)
CO2: 26 mmol/L (ref 20–29)
Calcium: 9.5 mg/dL (ref 8.7–10.3)
Creatinine, Ser: 1.07 mg/dL — ABNORMAL HIGH (ref 0.57–1.00)
GFR calc Af Amer: 59 mL/min/{1.73_m2} — ABNORMAL LOW (ref >59)
GFR calc non Af Amer: 51 mL/min/{1.73_m2} — ABNORMAL LOW (ref >59)
GLUCOSE: 96 mg/dL (ref 65–99)
Globulin, Total: 2.1 g/dL (ref 1.5–4.5)
Potassium: 4.6 mmol/L (ref 3.5–5.2)
Sodium: 141 mmol/L (ref 134–144)
TOTAL PROTEIN: 6.1 g/dL (ref 6.0–8.5)

## 2018-08-12 LAB — LIPID PANEL
CHOLESTEROL TOTAL: 223 mg/dL — AB (ref 100–199)
Chol/HDL Ratio: 4.5 ratio — ABNORMAL HIGH (ref 0.0–4.4)
HDL: 50 mg/dL (ref >39)
LDL Calculated: 139 mg/dL — ABNORMAL HIGH (ref 0–99)
Triglycerides: 168 mg/dL — ABNORMAL HIGH (ref 0–149)
VLDL CHOLESTEROL CAL: 34 mg/dL (ref 5–40)

## 2018-08-31 ENCOUNTER — Other Ambulatory Visit: Payer: Self-pay | Admitting: Obstetrics and Gynecology

## 2018-08-31 DIAGNOSIS — Z1231 Encounter for screening mammogram for malignant neoplasm of breast: Secondary | ICD-10-CM

## 2018-10-13 ENCOUNTER — Ambulatory Visit
Admission: RE | Admit: 2018-10-13 | Discharge: 2018-10-13 | Disposition: A | Discharge status: Home / Self Care | Payer: Medicare Other | Source: Ambulatory Visit | Attending: Obstetrics and Gynecology | Admitting: Obstetrics and Gynecology

## 2018-10-13 DIAGNOSIS — Z1231 Encounter for screening mammogram for malignant neoplasm of breast: Secondary | ICD-10-CM

## 2018-11-14 ENCOUNTER — Other Ambulatory Visit: Payer: Self-pay | Admitting: Nurse Practitioner

## 2019-02-09 ENCOUNTER — Encounter: Payer: Self-pay | Admitting: Nurse Practitioner

## 2019-02-09 ENCOUNTER — Ambulatory Visit (INDEPENDENT_AMBULATORY_CARE_PROVIDER_SITE_OTHER): Payer: Medicare Other | Admitting: Nurse Practitioner

## 2019-02-09 ENCOUNTER — Other Ambulatory Visit: Payer: Self-pay

## 2019-02-09 DIAGNOSIS — K219 Gastro-esophageal reflux disease without esophagitis: Secondary | ICD-10-CM

## 2019-02-09 DIAGNOSIS — E559 Vitamin D deficiency, unspecified: Secondary | ICD-10-CM

## 2019-02-09 DIAGNOSIS — R601 Generalized edema: Secondary | ICD-10-CM | POA: Diagnosis not present

## 2019-02-09 DIAGNOSIS — E782 Mixed hyperlipidemia: Secondary | ICD-10-CM | POA: Diagnosis not present

## 2019-02-09 DIAGNOSIS — I10 Essential (primary) hypertension: Secondary | ICD-10-CM

## 2019-02-09 DIAGNOSIS — D649 Anemia, unspecified: Secondary | ICD-10-CM

## 2019-02-09 MED ORDER — LISINOPRIL-HYDROCHLOROTHIAZIDE 20-12.5 MG PO TABS: 1.0000 | ORAL_TABLET | Freq: Every day | ORAL | 1 refills | Status: DC

## 2019-02-09 MED ORDER — OMEPRAZOLE 20 MG PO CPDR: 20.0000 mg | DELAYED_RELEASE_CAPSULE | Freq: Every day | ORAL | 1 refills | Status: DC

## 2019-02-09 MED ORDER — ROSUVASTATIN CALCIUM 20 MG PO TABS: 20.0000 mg | ORAL_TABLET | ORAL | 1 refills | Status: DC

## 2019-02-09 MED ORDER — MELOXICAM 15 MG PO TABS: 15.0000 mg | ORAL_TABLET | Freq: Every day | ORAL | 1 refills | Status: DC

## 2019-02-09 NOTE — Progress Notes (Signed)
Virtual Visit via telephone Note  I connected with Sarah Benton on 02/09/19 at 10:05 AM by telephone and verified that I am speaking with the correct person using two identifiers. Sarah Benton is currently located at home and no one is currently with her during visit. The provider, [name], FNP is located in their office at time of visit.  I discussed the limitations, risks, security and privacy concerns of performing an evaluation and management service by telephone and the availability of in person appointments. I also discussed with the patient that there may be a patient responsible charge related to this service. The patient expressed understanding and agreed to proceed.   History and Present Illness:   Chief Complaint: Medical Management of Chronic Issues    HPI:  1. Essential hypertension, benign No c/o chest pain, sob or headache. Does not check blood pressure at home. BP Readings from Last 3 Encounters:  08/11/18 114/63  06/02/18 114/61  02/17/18 95/65     2. Mixed hyperlipidemia Does not watch diet and does very little exercise  3. Gastroesophageal reflux disease without esophagitis Is on omeprazole daily and is working well to keep symptoms under control.  4. Generalized edema Has mostly in lower ext by end of the day.  5. Normocytic anemia Last hgb was 13.2. she no longer takes iron supplement.  6. Vitamin D deficiency Takes daily vitamin d supplement  7. Morbid obesity ([place]) No recent change in weight.    Outpatient Encounter Medications as of 02/09/2019  Medication Sig  . Cholecalciferol (VITAMIN D3) 2000 UNITS capsule Take 2,000 Units by mouth daily.    Marland Kitchen lisinopril-hydrochlorothiazide (PRINZIDE,ZESTORETIC) 20-12.5 MG tablet Take 1 tablet by mouth daily.  . meloxicam (MOBIC) 15 MG tablet TAKE 1 TABLET DAILY  . omeprazole (PRILOSEC) 20 MG capsule Take 1 capsule (20 mg total) by mouth daily.  . rosuvastatin (CRESTOR) 20 MG  tablet Take 1 tablet (20 mg total) by mouth every Monday, Wednesday, and Friday.      New complaints: None today  Social history: Lives by [employer]     ROS   Observations/Objective: Alert and oriented- answers all questions appropriately No distress  Assessment and Plan: Sarah Benton comes in today with chief complaint of Medical Management of Chronic Issues   Diagnosis and orders addressed:  1. Essential hypertension, benign Low sodium diet - lisinopril-hydrochlorothiazide (ZESTORETIC) 20-12.5 MG tablet; Take 1 tablet by mouth daily.  Dispense: 90 tablet; Refill: 1  2. Mixed hyperlipidemia Low fat diet - rosuvastatin (CRESTOR) 20 MG tablet; Take 1 tablet (20 mg total) by mouth every Monday, Wednesday, and Friday.  Dispense: 45 tablet; Refill: 1  3. Gastroesophageal reflux disease without esophagitis Avoid spicy foods Do not eat 2 hours prior to bedtime - omeprazole (PRILOSEC) 20 MG capsule; Take 1 capsule (20 mg total) by mouth daily.  Dispense: 90 capsule; Refill: 1  4. Generalized edema elevate legs when sitting  5. Normocytic anemia Will repeat labs at next visit  6. Vitamin D deficiency  7. Morbid obesity ([place]) Discussed diet and exercise for person with BMI >25 Will recheck weight in 3-6 months    Previous lab result sreviewed Health Maintenance reviewed Diet and exercise encouraged  Follow up plan: 3 months     I discussed the assessment and treatment plan with the patient. The patient was provided an opportunity to ask questions and all were answered. The patient agreed with the plan and demonstrated an understanding of the instructions.  The patient was advised to call back or seek an in-person evaluation if the symptoms worsen or if the condition fails to improve as anticipated.  The above assessment and management plan was discussed with the patient. The patient verbalized understanding of and has agreed to the management plan.  Patient is aware to call the clinic if symptoms persist or worsen. Patient is aware when to return to the clinic for a follow-up visit. Patient educated on when it is appropriate to go to the emergency department.   Time call ended:  10:25  I provided 20 minutes of non-face-to-face time during this encounter.    [name], FNP

## 2019-02-26 ENCOUNTER — Other Ambulatory Visit: Payer: Self-pay

## 2019-02-26 ENCOUNTER — Encounter: Payer: Self-pay | Admitting: Nurse Practitioner

## 2019-02-26 ENCOUNTER — Ambulatory Visit (INDEPENDENT_AMBULATORY_CARE_PROVIDER_SITE_OTHER): Payer: Medicare Other | Admitting: Nurse Practitioner

## 2019-02-26 DIAGNOSIS — H9201 Otalgia, right ear: Secondary | ICD-10-CM | POA: Diagnosis not present

## 2019-02-26 NOTE — Progress Notes (Signed)
   Virtual Visit via telephone Note  I connected with@ on 02/26/19 at 1:55 PM by video and verified that I am speaking with the correct person using two identifiers. DJENEBA BARSCH is currently located at home and her husband is currently with her during visit. The provider, [name], FNP is located in their office at time of visit.  I discussed the limitations, risks, security and privacy concerns of performing an evaluation and management service by telephone and the availability of in person appointments. I also discussed with the patient that there may be a patient responsible charge related to this service. The patient expressed understanding and agreed to proceed.   History and Present Illness:    Chief Complaint: Ear Pain   HPI Patient in calls today c/o of right ear pain. Pain is constant and seems to be worse at night. No pain with touching or wiggling ear. No drainage. It has been hurting for 2 weeks. Is unchanged. Has used ear drops, and took some left over amoxicillin TID for 3 days.   Review of Systems  Constitutional: Negative.   HENT: Positive for ear pain (right).   Respiratory: Negative.   Cardiovascular: Negative.   Genitourinary: Negative.   Musculoskeletal: Negative.   Skin: Negative.   Neurological: Negative.   Psychiatric/Behavioral: Negative.   All other systems reviewed and are negative.      Observations/Objective: Alert and oriented No distress  Assessment and Plan: *MAKYNZEE TIGGES in today with chief complaint of Ear Pain   1. Right ear pain flonase nasal spray clarinex OTC Force fluids If no better by Monday , let me know    Follow Up Instructions: prn    I discussed the assessment and treatment plan with the patient. The patient was provided an opportunity to ask questions and all were answered. The patient agreed with the plan and demonstrated an understanding of the instructions.   The patient was advised to call  back or seek an in-person evaluation if the symptoms worsen or if the condition fails to improve as anticipated.  The above assessment and management plan was discussed with the patient. The patient verbalized understanding of and has agreed to the management plan. Patient is aware to call the clinic if symptoms persist or worsen. Patient is aware when to return to the clinic for a follow-up visit. Patient educated on when it is appropriate to go to the emergency department.   Time call ended: 2:10 PM  I provided 15 minutes of face-to-face time during this encounter.    [name], FNP

## 2019-03-08 ENCOUNTER — Encounter: Payer: Self-pay | Admitting: Family Medicine

## 2019-03-08 ENCOUNTER — Ambulatory Visit (INDEPENDENT_AMBULATORY_CARE_PROVIDER_SITE_OTHER): Payer: Medicare Other | Admitting: Family Medicine

## 2019-03-08 ENCOUNTER — Other Ambulatory Visit: Payer: Self-pay

## 2019-03-08 VITALS — BP 122/74 | HR 71 | Temp 99.4°F | Ht 62.0 in | Wt 216.0 lb

## 2019-03-08 DIAGNOSIS — H60333 Swimmer's ear, bilateral: Secondary | ICD-10-CM | POA: Diagnosis not present

## 2019-03-08 MED ORDER — CIPRODEX 0.3-0.1 % OT SUSP: 4.0000 [drp] | Freq: Two times a day (BID) | OTIC | 0 refills | Status: DC

## 2019-03-08 NOTE — Progress Notes (Signed)
Subjective: CC: ear itching PCP: Chevis Pretty, FNP AJG:OTLXBWI Sarah Benton is a 73 y.o. female presenting to clinic today for:  1. Ear concern Patient reports several week history of ear itching right greater than left.  She thought she had some drainage from the right ear recently.  She had had some ear irritation and pain earlier in the course.  She was seen by her PCP via telephone visit a few weeks ago who prescribed her Flonase nasal spray and recommended Clarinex.  She notes she purchased Claritin-D but was only able to take 1 tablet before feel like her heart was racing out of her chest.  She also notes that interfered with sleep.  Though it did seem to help some with her symptoms.  No fevers.  No dizziness.   ROS: Per HPI  Allergies  Allergen Reactions  . Amoxicillin Diarrhea  . Septra [Sulfamethoxazole-Trimethoprim]     Nausea   . Atorvastatin Other (See Comments)    Myalgias / muscle aches    Past Medical History:  Diagnosis Date  . Anemia    Iron def  . Arthritis   . Cataract   . Edema   . Esophageal stricture   . GERD (gastroesophageal reflux disease)   . Hyperlipidemia   . Hypertension   . Obesity   . Ruptured lumbar disc     Current Outpatient Medications:  .  Cholecalciferol (VITAMIN D3) 2000 UNITS capsule, Take 2,000 Units by mouth daily.  , Disp: , Rfl:  .  lisinopril-hydrochlorothiazide (ZESTORETIC) 20-12.5 MG tablet, Take 1 tablet by mouth daily., Disp: 90 tablet, Rfl: 1 .  meloxicam (MOBIC) 15 MG tablet, Take 1 tablet (15 mg total) by mouth daily., Disp: 90 tablet, Rfl: 1 .  omeprazole (PRILOSEC) 20 MG capsule, Take 1 capsule (20 mg total) by mouth daily., Disp: 90 capsule, Rfl: 1 .  rosuvastatin (CRESTOR) 20 MG tablet, Take 1 tablet (20 mg total) by mouth every Monday, Wednesday, and Friday., Disp: 45 tablet, Rfl: 1 Social History   Socioeconomic History  . Marital status: Widowed    Spouse name: Not on file  . Number of children: 2  .  Years of education: 68  . Highest education level: 12th grade  Occupational History  . Occupation: Retired    Comment: Chief of Staff  . Financial resource strain: Not hard at all  . Food insecurity    Worry: Never true    Inability: Never true  . Transportation needs    Medical: No    Non-medical: No  Tobacco Use  . Smoking status: Never Smoker  . Smokeless tobacco: Never Used  Substance and Sexual Activity  . Alcohol use: No    Alcohol/week: 0.0 standard drinks  . Drug use: No  . Sexual activity: Not Currently  Lifestyle  . Physical activity    Days per week: 0 days    Minutes per session: 0 min  . Stress: Only a little  Relationships  . Social connections    Talks on phone: More than three times a week    Gets together: More than three times a week    Attends religious service: Never    Active member of club or organization: No    Attends meetings of clubs or organizations: Never    Relationship status: Widowed  . Intimate partner violence    Fear of current or ex partner: Not on file    Emotionally abused: Not on file    Physically  abused: Not on file    Forced sexual activity: Not on file  Other Topics Concern  . Not on file  Social History Narrative  . Not on file   Family History  Problem Relation Age of Onset  . Parkinsonism Mother 80  . Ovarian cancer Mother   . Hypertension Father   . Prostate cancer Father 76       met to bone / hip  . Arthritis Sister        knee  . Diabetes Daughter   . Diabetes Son        type 2  . Diabetes Grandchild 17       type 1  . Colon cancer Neg Hx   . Breast cancer Neg Hx     Objective: Office vital signs reviewed. BP 122/74   Pulse 71   Temp 99.4 F (37.4 C) (Oral)   Ht '5\' 2"'  (1.575 m)   Wt 216 lb (98 kg)   BMI 39.51 kg/m   Physical Examination:  General: Awake, alert, well nourished, No acute distress HEENT: Normal    Neck: No masses palpated. No lymphadenopathy    Ears: Tympanic  membranes intact, normal light reflex, no erythema, no bulging; external auditory canal on the right with quite a bit of inflammation.  No gross exudates or bleeding appreciated.  Left external auditory canal with some inflammation but lesser than on the right.  She has a bit of cerumen in this canal as well.    Eyes: extraocular membranes intact, sclera white  Assessment/ Plan: 73 y.o. female   1. Acute swimmer'Sarah ear of both sides Given inflammation I think that she would benefit from a corticosteroid.  I prescribed her Ciprodex to place in bilateral ears twice daily for 7 days.  I have recommended that she use plain Claritin without pseudoephedrine.  Okay to continue Flonase.  We discussed reasons for reevaluation.  She was good understanding will follow-up PRN - ciprofloxacin-dexamethasone (CIPRODEX) OTIC suspension; Place 4 drops into both ears 2 (two) times daily for 7 days.  Dispense: 7.5 mL; Refill: 0   No orders of the defined types were placed in this encounter.  No orders of the defined types were placed in this encounter.    Sarah Norlander, DO [place] [phone]

## 2019-03-08 NOTE — Patient Instructions (Signed)
There is inflammation in the ear canal.  No overt evidence of infection.  I agree that this is likely related to allergies but in efforts to improve your symptoms, I have prescribed an ear drop that has steroid and antibiotic in it.  I'd like you to use PLAIN Claritin for allergies.  Ok to continue flonase if you find that helpful.   Otitis Externa  Otitis externa is an infection of the outer ear canal. The outer ear canal is the area between the outside of the ear and the eardrum. Otitis externa is sometimes called swimmer's ear. What are the causes? Common causes of this condition include:  Swimming in dirty water.  Moisture in the ear.  An injury to the inside of the ear.  An object stuck in the ear.  A cut or scrape on the outside of the ear. What increases the risk? You are more likely to get this condition if you go swimming often. What are the signs or symptoms?  Itching in the ear. This is often the first symptom.  Swelling of the ear.  Redness in the ear.  Ear pain. The pain may get worse when you pull on your ear.  Pus coming from the ear. How is this treated? This condition may be treated with:  Antibiotic ear drops. These are often given for 10-14 days.  Medicines to reduce itching and swelling. Follow these instructions at home:  If you were given antibiotic ear drops, use them as told by your doctor. Do not stop using them even if your condition gets better.  Take over-the-counter and prescription medicines only as told by your doctor.  Avoid getting water in your ears as told by your doctor. You may be told to avoid swimming or water sports for a few days.  Keep all follow-up visits as told by your doctor. This is important. How is this prevented?  Keep your ears dry. Use the corner of a towel to dry your ears after you swim or bathe.  Try not to scratch or put things in your ear. Doing these things makes it easier for germs to grow in your ear.   Avoid swimming in lakes, dirty water, or pools that may not have the right amount of a chemical called chlorine. Contact a doctor if:  You have a fever.  Your ear is still red, swollen, or painful after 3 days.  You still have pus coming from your ear after 3 days.  Your redness, swelling, or pain gets worse.  You have a really bad headache.  You have redness, swelling, pain, or tenderness behind your ear. Summary  Otitis externa is an infection of the outer ear canal.  Symptoms include pain, redness, and swelling of the ear.  If you were given antibiotic ear drops, use them as told by your doctor. Do not stop using them even if your condition gets better.  Try not to scratch or put things in your ear. This information is not intended to replace advice given to you by your health care provider. Make sure you discuss any questions you have with your health care provider. Document Released: 02/26/2008 Document Revised: 02/13/2018 Document Reviewed: 02/13/2018 Elsevier Interactive Patient Education  2019 [employer].

## 2019-03-14 ENCOUNTER — Encounter: Payer: Self-pay | Admitting: Family Medicine

## 2019-03-15 ENCOUNTER — Other Ambulatory Visit: Payer: Self-pay | Admitting: Physician Assistant

## 2019-03-15 DIAGNOSIS — H60333 Swimmer's ear, bilateral: Secondary | ICD-10-CM

## 2019-03-15 MED ORDER — CIPRODEX 0.3-0.1 % OT SUSP: 4.0000 [drp] | Freq: Two times a day (BID) | OTIC | 0 refills | Status: AC

## 2019-05-14 ENCOUNTER — Ambulatory Visit: Payer: Self-pay | Admitting: Nurse Practitioner

## 2019-06-09 ENCOUNTER — Other Ambulatory Visit: Payer: Self-pay

## 2019-06-10 ENCOUNTER — Ambulatory Visit (INDEPENDENT_AMBULATORY_CARE_PROVIDER_SITE_OTHER): Payer: Medicare Other

## 2019-06-10 ENCOUNTER — Encounter: Payer: Self-pay | Admitting: Nurse Practitioner

## 2019-06-10 ENCOUNTER — Ambulatory Visit (INDEPENDENT_AMBULATORY_CARE_PROVIDER_SITE_OTHER): Payer: Medicare Other | Admitting: Nurse Practitioner

## 2019-06-10 ENCOUNTER — Other Ambulatory Visit: Payer: Self-pay

## 2019-06-10 VITALS — BP 109/58 | HR 50 | Temp 98.6°F | Resp 20 | Ht 62.0 in | Wt 213.0 lb

## 2019-06-10 DIAGNOSIS — Z23 Encounter for immunization: Secondary | ICD-10-CM

## 2019-06-10 DIAGNOSIS — E559 Vitamin D deficiency, unspecified: Secondary | ICD-10-CM

## 2019-06-10 DIAGNOSIS — K219 Gastro-esophageal reflux disease without esophagitis: Secondary | ICD-10-CM

## 2019-06-10 DIAGNOSIS — E782 Mixed hyperlipidemia: Secondary | ICD-10-CM

## 2019-06-10 DIAGNOSIS — R601 Generalized edema: Secondary | ICD-10-CM

## 2019-06-10 DIAGNOSIS — I10 Essential (primary) hypertension: Secondary | ICD-10-CM | POA: Diagnosis not present

## 2019-06-10 DIAGNOSIS — D649 Anemia, unspecified: Secondary | ICD-10-CM

## 2019-06-10 MED ORDER — ROSUVASTATIN CALCIUM 20 MG PO TABS: 20.0000 mg | ORAL_TABLET | ORAL | 1 refills | Status: DC

## 2019-06-10 MED ORDER — LISINOPRIL-HYDROCHLOROTHIAZIDE 20-12.5 MG PO TABS: 1.0000 | ORAL_TABLET | Freq: Every day | ORAL | 1 refills | Status: DC

## 2019-06-10 MED ORDER — OMEPRAZOLE 20 MG PO CPDR: 20.0000 mg | DELAYED_RELEASE_CAPSULE | Freq: Every day | ORAL | 1 refills | Status: DC

## 2019-06-10 NOTE — Addendum Note (Signed)
Addended by: Chevis Pretty on: 06/10/2019 12:44 PM   Modules accepted: Orders

## 2019-06-10 NOTE — Patient Instructions (Signed)
Exercising to Stay Healthy To become healthy and stay healthy, it is recommended that you do moderate-intensity and vigorous-intensity exercise. You can tell that you are exercising at a moderate intensity if your heart starts beating faster and you start breathing faster but can still hold a conversation. You can tell that you are exercising at a vigorous intensity if you are breathing much harder and faster and cannot hold a conversation while exercising. Exercising regularly is important. It has many health benefits, such as:  Improving overall fitness, flexibility, and endurance.  Increasing bone density.  Helping with weight control.  Decreasing body fat.  Increasing muscle strength.  Reducing stress and tension.  Improving overall health. How often should I exercise? Choose an activity that you enjoy, and set realistic goals. Your health care provider can help you make an activity plan that works for you. Exercise regularly as told by your health care provider. This may include:  Doing strength training two times a week, such as: ? Lifting weights. ? Using resistance bands. ? Push-ups. ? Sit-ups. ? Yoga.  Doing a certain intensity of exercise for a given amount of time. Choose from these options: ? A total of 150 minutes of moderate-intensity exercise every week. ? A total of 75 minutes of vigorous-intensity exercise every week. ? A mix of moderate-intensity and vigorous-intensity exercise every week. Children, pregnant women, people who have not exercised regularly, people who are overweight, and older adults may need to talk with a health care provider about what activities are safe to do. If you have a medical condition, be sure to talk with your health care provider before you start a new exercise program. What are some exercise ideas? Moderate-intensity exercise ideas include:  Walking 1 mile (1.6 km) in about 15 minutes.  Biking.  Hiking.  Golfing.  Dancing.   Water aerobics. Vigorous-intensity exercise ideas include:  Walking 4.5 miles (7.2 km) or more in about 1 hour.  Jogging or running 5 miles (8 km) in about 1 hour.  Biking 10 miles (16.1 km) or more in about 1 hour.  Lap swimming.  Roller-skating or in-line skating.  Cross-country skiing.  Vigorous competitive sports, such as football, basketball, and soccer.  Jumping rope.  Aerobic dancing. What are some everyday activities that can help me to get exercise?  Yard work, such as: ? Pushing a lawn mower. ? Raking and bagging leaves.  Washing your car.  Pushing a stroller.  Shoveling snow.  Gardening.  Washing windows or floors. How can I be more active in my day-to-day activities?  Use stairs instead of an elevator.  Take a walk during your lunch break.  If you drive, park your car farther away from your work or school.  If you take public transportation, get off one stop early and walk the rest of the way.  Stand up or walk around during all of your indoor phone calls.  Get up, stretch, and walk around every 30 minutes throughout the day.  Enjoy exercise with a friend. Support to continue exercising will help you keep a regular routine of activity. What guidelines can I follow while exercising?  Before you start a new exercise program, talk with your health care provider.  Do not exercise so much that you hurt yourself, feel dizzy, or get very short of breath.  Wear comfortable clothes and wear shoes with good support.  Drink plenty of water while you exercise to prevent dehydration or heat stroke.  Work out until your breathing   and your heartbeat get faster. Where to find more information  [place].S[place] Department of Health and Human Services: www.hhs.gov  Centers for Disease Control and Prevention (CDC): www.cdc.gov Summary  Exercising regularly is important. It will improve your overall fitness, flexibility, and endurance.  Regular exercise also will  improve your overall health. It can help you control your weight, reduce stress, and improve your bone density.  Do not exercise so much that you hurt yourself, feel dizzy, or get very short of breath.  Before you start a new exercise program, talk with your health care provider. This information is not intended to replace advice given to you by your health care provider. Make sure you discuss any questions you have with your health care provider. Document Released: 10/12/2010 Document Revised: 08/22/2017 Document Reviewed: 07/31/2017 Elsevier Patient Education  2020 Elsevier Inc.  

## 2019-06-10 NOTE — Progress Notes (Signed)
Subjective:    Patient ID: Sarah Benton, female    DOB: 1946/09/11, 73 y.o.   MRN: [id number]   Chief Complaint: Medical Management of Chronic Issues    HPI:  1. Essential hypertension, benign No c/o chest pain, sob or headache. Does not check blood pressure at home. BP Readings from Last 3 Encounters:  06/10/19 (!) 109/58  03/08/19 122/74  08/11/18 114/63     2. Mixed hyperlipidemia Does not watch diet and does no exercise. Stopped her crestor due to leg cramps Lab Results  Component Value Date   CHOL 223 (H) 08/11/2018   HDL 50 08/11/2018   LDLCALC 139 (H) 08/11/2018   TRIG 168 (H) 08/11/2018   CHOLHDL 4.5 (H) 08/11/2018     3. Gastroesophageal reflux disease without esophagitis Is on omeprazole daily and says that works well to keep symptoms under control.  4. Generalized edema Has not had any lower ext edema lately  5. Normocytic anemia Denies fatigue Lab Results  Component Value Date   HGB 13.2 02/17/2018     6. Vitamin D deficiency Takes a daily vitamin d supplement  7. Morbid obesity ([place]) No recent weight changes Wt Readings from Last 3 Encounters:  06/10/19 213 lb (96.6 kg)  03/08/19 216 lb (98 kg)  08/11/18 219 lb (99.3 kg)   BMI Readings from Last 3 Encounters:  06/10/19 38.96 kg/m  03/08/19 39.51 kg/m  08/11/18 40.06 kg/m       Outpatient Encounter Medications as of 06/10/2019  Medication Sig  . Cholecalciferol (VITAMIN D3) 2000 UNITS capsule Take 2,000 Units by mouth daily.    Marland Kitchen lisinopril-hydrochlorothiazide (ZESTORETIC) 20-12.5 MG tablet Take 1 tablet by mouth daily.  . meloxicam (MOBIC) 15 MG tablet Take 1 tablet (15 mg total) by mouth daily.  Marland Kitchen omeprazole (PRILOSEC) 20 MG capsule Take 1 capsule (20 mg total) by mouth daily.     Past Surgical History:  Procedure Laterality Date  . ABDOMINAL HYSTERECTOMY Bilateral December 21, 1999   complete  . BREAST BIOPSY  1993   Left/negative  . BREAST CYST EXCISION Left   . LUMBAR  LAMINECTOMY  1998   ruptured disc  . TONSILLECTOMY    . TUBAL LIGATION  1979    Family History  Problem Relation Age of Onset  . Parkinsonism Mother 12  . Ovarian cancer Mother   . Hypertension Father   . Prostate cancer Father 48       met to bone / hip  . Arthritis Sister        knee  . Diabetes Daughter   . Diabetes Son        type 2  . Diabetes Grandchild 17       type 1  . Colon cancer Neg Hx   . Breast cancer Neg Hx     New complaints: None today  Social history: Lives by herself- husband passed away in 12-20-2016.  Controlled substance contract: n/a    Review of Systems  Constitutional: Negative for activity change and appetite change.  HENT: Negative.   Eyes: Negative for pain.  Respiratory: Negative for shortness of breath.   Cardiovascular: Negative for chest pain, palpitations and leg swelling.  Gastrointestinal: Negative for abdominal pain.  Endocrine: Negative for polydipsia.  Genitourinary: Negative.   Skin: Negative for rash.  Neurological: Negative for dizziness, weakness and headaches.  Hematological: Does not bruise/bleed easily.  Psychiatric/Behavioral: Negative.   All other systems reviewed and are negative.      Objective:  Physical Exam Vitals signs and nursing note reviewed.  Constitutional:      General: She is not in acute distress.    Appearance: Normal appearance. She is well-developed.  HENT:     Head: Normocephalic.     Nose: Nose normal.  Eyes:     Pupils: Pupils are equal, round, and reactive to light.  Neck:     Musculoskeletal: Normal range of motion and neck supple.     Vascular: No carotid bruit or JVD.  Cardiovascular:     Rate and Rhythm: Normal rate and regular rhythm.     Heart sounds: Normal heart sounds.  Pulmonary:     Effort: Pulmonary effort is normal. No respiratory distress.     Breath sounds: Normal breath sounds. No wheezing or rales.  Chest:     Chest wall: No tenderness.  Abdominal:     General:  Bowel sounds are normal. There is no distension or abdominal bruit.     Palpations: Abdomen is soft. There is no hepatomegaly, splenomegaly, mass or pulsatile mass.     Tenderness: There is no abdominal tenderness.  Musculoskeletal: Normal range of motion.  Lymphadenopathy:     Cervical: No cervical adenopathy.  Skin:    General: Skin is warm and dry.  Neurological:     Mental Status: She is alert and oriented to person, place, and time.     Deep Tendon Reflexes: Reflexes are normal and symmetric.  Psychiatric:        Behavior: Behavior normal.        Thought Content: Thought content normal.        Judgment: Judgment normal.     BP (!) 109/58   Pulse (!) 50   Temp 98.6 F (37 C) (Oral)   Resp 20   Ht '5\' 2"'  (1.575 m)   Wt 213 lb (96.6 kg)   SpO2 98%   BMI 38.96 kg/m   EKG- NSR-Sarah Hassell Done, FNP  Chest xray- no acute or chronic findings-Preliminary reading by Sarah Collum, FNP  [facility]       Assessment & Plan:  Sarah Benton comes in today with chief complaint of Medical Management of Chronic Issues   Diagnosis and orders addressed:  1. Essential hypertension, benign Low sodium diet - lisinopril-hydrochlorothiazide (ZESTORETIC) 20-12.5 MG tablet; Take 1 tablet by mouth daily.  Dispense: 90 tablet; Refill: 1 - CMP14+EGFR - DG Chest 2 View; Future - EKG 12-Lead  2. Mixed hyperlipidemia Low fat diet - rosuvastatin (CRESTOR) 20 MG tablet; Take 1 tablet (20 mg total) by mouth every Monday, Wednesday, and Friday.  Dispense: 45 tablet; Refill: 1 - Lipid panel  3. Gastroesophageal reflux disease without esophagitis Avoid spicy foods Do not eat 2 hours prior to bedtime - omeprazole (PRILOSEC) 20 MG capsule; Take 1 capsule (20 mg total) by mouth daily.  Dispense: 90 capsule; Refill: 1  4. Generalized edema Elevate legs when occurs  5. Normocytic anemia - CBC with Differential/Platelet  6. Vitamin D deficiency  7. Morbid obesity ([place]) Discussed diet and  exercise for person with BMI >25 Will recheck weight in 3-6 months    Labs pending Health Maintenance reviewed Diet and exercise encouraged  Follow up plan: 6 months   Sarah Hassell Done, FNP

## 2019-06-11 LAB — CMP14+EGFR
ALT: 36 IU/L — ABNORMAL HIGH (ref 0–32)
AST: 36 IU/L (ref 0–40)
Albumin/Globulin Ratio: 1.8 (ref 1.2–2.2)
Albumin: 4 g/dL (ref 3.7–4.7)
Alkaline Phosphatase: 115 IU/L (ref 39–117)
BUN/Creatinine Ratio: 18 (ref 12–28)
BUN: 16 mg/dL (ref 8–27)
Bilirubin Total: 0.3 mg/dL (ref 0.0–1.2)
CO2: 26 mmol/L (ref 20–29)
Calcium: 9.3 mg/dL (ref 8.7–10.3)
Chloride: 103 mmol/L (ref 96–106)
Creatinine, Ser: 0.9 mg/dL (ref 0.57–1.00)
GFR calc Af Amer: 72 mL/min/{1.73_m2} (ref >59)
GFR calc non Af Amer: 63 mL/min/{1.73_m2} (ref >59)
Globulin, Total: 2.2 g/dL (ref 1.5–4.5)
Glucose: 75 mg/dL (ref 65–99)
Potassium: 4.6 mmol/L (ref 3.5–5.2)
Sodium: 144 mmol/L (ref 134–144)
Total Protein: 6.2 g/dL (ref 6.0–8.5)

## 2019-06-11 LAB — CBC WITH DIFFERENTIAL/PLATELET
Basophils Absolute: 0.1 10*3/uL (ref 0.0–0.2)
Basos: 1 %
EOS (ABSOLUTE): 0.2 10*3/uL (ref 0.0–0.4)
Eos: 2 %
Hematocrit: 40.8 % (ref 34.0–46.6)
Hemoglobin: 13.7 g/dL (ref 11.1–15.9)
Immature Grans (Abs): 0 10*3/uL (ref 0.0–0.1)
Immature Granulocytes: 0 %
Lymphocytes Absolute: 2.3 10*3/uL (ref 0.7–3.1)
Lymphs: 20 %
MCH: 28.1 pg (ref 26.6–33.0)
MCHC: 33.6 g/dL (ref 31.5–35.7)
MCV: 84 fL (ref 79–97)
Monocytes Absolute: 0.7 10*3/uL (ref 0.1–0.9)
Monocytes: 6 %
Neutrophils Absolute: 8.3 10*3/uL — ABNORMAL HIGH (ref 1.4–7.0)
Neutrophils: 71 %
Platelets: 342 10*3/uL (ref 150–450)
RBC: 4.87 x10E6/uL (ref 3.77–5.28)
RDW: 12.6 % (ref 11.7–15.4)
WBC: 11.7 10*3/uL — ABNORMAL HIGH (ref 3.4–10.8)

## 2019-06-11 LAB — THYROID PANEL WITH TSH
Free Thyroxine Index: 1.6 (ref 1.2–4.9)
T3 Uptake Ratio: 21 % — ABNORMAL LOW (ref 24–39)
T4, Total: 7.4 ug/dL (ref 4.5–12.0)
TSH: 1.63 u[IU]/mL (ref 0.450–4.500)

## 2019-06-11 LAB — LIPID PANEL
Chol/HDL Ratio: 4.8 ratio — ABNORMAL HIGH (ref 0.0–4.4)
Cholesterol, Total: 226 mg/dL — ABNORMAL HIGH (ref 100–199)
HDL: 47 mg/dL (ref >39)
LDL Chol Calc (NIH): 145 mg/dL — ABNORMAL HIGH (ref 0–99)
Triglycerides: 186 mg/dL — ABNORMAL HIGH (ref 0–149)
VLDL Cholesterol Cal: 34 mg/dL (ref 5–40)

## 2019-08-10 ENCOUNTER — Other Ambulatory Visit: Payer: Self-pay | Admitting: Nurse Practitioner

## 2019-08-24 ENCOUNTER — Other Ambulatory Visit: Payer: Self-pay | Admitting: Obstetrics and Gynecology

## 2019-08-24 DIAGNOSIS — Z1231 Encounter for screening mammogram for malignant neoplasm of breast: Secondary | ICD-10-CM

## 2019-10-16 ENCOUNTER — Ambulatory Visit: Payer: Medicare PPO

## 2019-10-19 ENCOUNTER — Ambulatory Visit
Admission: RE | Admit: 2019-10-19 | Discharge: 2019-10-19 | Disposition: A | Discharge status: Home / Self Care | Payer: Medicare PPO | Source: Ambulatory Visit | Attending: Obstetrics and Gynecology | Admitting: Obstetrics and Gynecology

## 2019-10-19 ENCOUNTER — Other Ambulatory Visit: Payer: Self-pay

## 2019-10-19 DIAGNOSIS — Z1231 Encounter for screening mammogram for malignant neoplasm of breast: Secondary | ICD-10-CM

## 2019-11-02 ENCOUNTER — Other Ambulatory Visit: Payer: Self-pay | Admitting: Nurse Practitioner

## 2019-11-24 ENCOUNTER — Other Ambulatory Visit: Payer: Self-pay | Admitting: Nurse Practitioner

## 2019-11-24 ENCOUNTER — Ambulatory Visit (INDEPENDENT_AMBULATORY_CARE_PROVIDER_SITE_OTHER): Payer: Medicare PPO | Admitting: Family Medicine

## 2019-11-24 ENCOUNTER — Encounter: Payer: Self-pay | Admitting: Family Medicine

## 2019-11-24 DIAGNOSIS — R3989 Other symptoms and signs involving the genitourinary system: Secondary | ICD-10-CM

## 2019-11-24 DIAGNOSIS — E782 Mixed hyperlipidemia: Secondary | ICD-10-CM

## 2019-11-24 MED ORDER — CIPROFLOXACIN HCL 500 MG PO TABS: 500.0000 mg | ORAL_TABLET | Freq: Two times a day (BID) | ORAL | 0 refills | Status: AC

## 2019-11-24 NOTE — Patient Instructions (Signed)
Urinary Tract Infection, Adult A urinary tract infection (UTI) is an infection of any part of the urinary tract. The urinary tract includes:  The kidneys.  The ureters.  The bladder.  The urethra. These organs make, store, and get rid of pee (urine) in the body. What are the causes? This is caused by germs (bacteria) in your genital area. These germs grow and cause swelling (inflammation) of your urinary tract. What increases the risk? You are more likely to develop this condition if:  You have a small, thin tube (catheter) to drain pee.  You cannot control when you pee or poop (incontinence).  You are female, and: ? You use these methods to prevent pregnancy:  A medicine that kills sperm (spermicide).  A device that blocks sperm (diaphragm). ? You have low levels of a female hormone (estrogen). ? You are pregnant.  You have genes that add to your risk.  You are sexually active.  You take antibiotic medicines.  You have trouble peeing because of: ? A prostate that is bigger than normal, if you are female. ? A blockage in the part of your body that drains pee from the bladder (urethra). ? A kidney stone. ? A nerve condition that affects your bladder (neurogenic bladder). ? Not getting enough to drink. ? Not peeing often enough.  You have other conditions, such as: ? Diabetes. ? A weak disease-fighting system (immune system). ? Sickle cell disease. ? Gout. ? Injury of the spine. What are the signs or symptoms? Symptoms of this condition include:  Needing to pee right away (urgently).  Peeing often.  Peeing small amounts often.  Pain or burning when peeing.  Blood in the pee.  Pee that smells bad or not like normal.  Trouble peeing.  Pee that is cloudy.  Fluid coming from the vagina, if you are female.  Pain in the belly or lower back. Other symptoms include:  Throwing up (vomiting).  No urge to eat.  Feeling mixed up (confused).  Being tired  and grouchy (irritable).  A fever.  Watery poop (diarrhea). How is this treated? This condition may be treated with:  Antibiotic medicine.  Other medicines.  Drinking enough water. Follow these instructions at home:  Medicines  Take over-the-counter and prescription medicines only as told by your doctor.  If you were prescribed an antibiotic medicine, take it as told by your doctor. Do not stop taking it even if you start to feel better. General instructions  Make sure you: ? Pee until your bladder is empty. ? Do not hold pee for a long time. ? Empty your bladder after sex. ? Wipe from front to back after pooping if you are a female. Use each tissue one time when you wipe.  Drink enough fluid to keep your pee pale yellow.  Keep all follow-up visits as told by your doctor. This is important. Contact a doctor if:  You do not get better after 1-2 days.  Your symptoms go away and then come back. Get help right away if:  You have very bad back pain.  You have very bad pain in your lower belly.  You have a fever.  You are sick to your stomach (nauseous).  You are throwing up. Summary  A urinary tract infection (UTI) is an infection of any part of the urinary tract.  This condition is caused by germs in your genital area.  There are many risk factors for a UTI. These include having a small, thin   tube to drain pee and not being able to control when you pee or poop.  Treatment includes antibiotic medicines for germs.  Drink enough fluid to keep your pee pale yellow. This information is not intended to replace advice given to you by your health care provider. Make sure you discuss any questions you have with your health care provider. Document Revised: 08/27/2018 Document Reviewed: 03/19/2018 Elsevier Patient Education  2020 [employer]  

## 2019-11-24 NOTE — Progress Notes (Signed)
Virtual Visit via Telephone Note  I connected with Sarah Benton on 11/24/19 at 10:20 AM by telephone and verified that I am speaking with the correct person using two identifiers. Sarah Benton is currently located at home and nobody is currently with her during this visit. The provider, Loman Brooklyn, FNP is located in their office at time of visit.  I discussed the limitations, risks, security and privacy concerns of performing an evaluation and management service by telephone and the availability of in person appointments. I also discussed with the patient that there may be a patient responsible charge related to this service. The patient expressed understanding and agreed to proceed.  Subjective: PCP: Sarah Pretty, FNP  Chief Complaint  Patient presents with  . Urinary Tract Infection   Urinary Tract Infection: Patient complains of dysuria, frequency and suprapubic pressure. She has had symptoms for 2 days. Patient denies fever and stomach ache. Patient does have a history of recurrent UTI.  Patient does not have a history of pyelonephritis. She has been taking Azo which has not been helpful. She is currently taking clindamycin from a recent tooth extraction.  She has 2 more days of medication.   ROS: Per HPI  Current Outpatient Medications:  .  Cholecalciferol (VITAMIN D3) 2000 UNITS capsule, Take 2,000 Units by mouth daily.  , Disp: , Rfl:  .  lisinopril-hydrochlorothiazide (ZESTORETIC) 20-12.5 MG tablet, Take 1 tablet by mouth daily., Disp: 90 tablet, Rfl: 1 .  meloxicam (MOBIC) 15 MG tablet, TAKE 1 TABLET DAILY, Disp: 90 tablet, Rfl: 0 .  omeprazole (PRILOSEC) 20 MG capsule, Take 1 capsule (20 mg total) by mouth daily., Disp: 90 capsule, Rfl: 1 .  rosuvastatin (CRESTOR) 20 MG tablet, Take 1 tablet (20 mg total) by mouth every Monday, Wednesday, and Friday., Disp: 45 tablet, Rfl: 1  Allergies  Allergen Reactions  . Amoxicillin Diarrhea  . Crestor  [Rosuvastatin Calcium]     Leg cramps   . Septra [Sulfamethoxazole-Trimethoprim]     Nausea   . Atorvastatin Other (See Comments)    Myalgias / muscle aches    Past Medical History:  Diagnosis Date  . Anemia    Iron def  . Arthritis   . Cataract   . Edema   . Esophageal stricture   . GERD (gastroesophageal reflux disease)   . Hyperlipidemia   . Hypertension   . Obesity   . Ruptured lumbar disc     Observations/Objective: A&O  No respiratory distress or wheezing audible over the phone Mood, judgement, and thought processes all WNL  Assessment and Plan: 1. Suspected UTI - Education provided on UTIs. Encouraged adequate hydration.  - ciprofloxacin (CIPRO) 500 MG tablet; Take 1 tablet (500 mg total) by mouth 2 (two) times daily for 7 days.  Dispense: 14 tablet; Refill: 0   Follow Up Instructions:  I discussed the assessment and treatment plan with the patient. The patient was provided an opportunity to ask questions and all were answered. The patient agreed with the plan and demonstrated an understanding of the instructions.   The patient was advised to call back or seek an in-person evaluation if the symptoms worsen or if the condition fails to improve as anticipated.  The above assessment and management plan was discussed with the patient. The patient verbalized understanding of and has agreed to the management plan. Patient is aware to call the clinic if symptoms persist or worsen. Patient is aware when to return to the clinic for  a follow-up visit. Patient educated on when it is appropriate to go to the emergency department.   Time call ended: 10:28 AM  I provided 10 minutes of non-face-to-face time during this encounter.  Hendricks Limes, MSN, APRN, FNP-C [facility] 11/24/19

## 2019-12-07 DIAGNOSIS — H524 Presbyopia: Secondary | ICD-10-CM | POA: Diagnosis not present

## 2019-12-07 DIAGNOSIS — H5203 Hypermetropia, bilateral: Secondary | ICD-10-CM | POA: Diagnosis not present

## 2019-12-07 DIAGNOSIS — H25813 Combined forms of age-related cataract, bilateral: Secondary | ICD-10-CM | POA: Diagnosis not present

## 2019-12-09 ENCOUNTER — Ambulatory Visit: Payer: Self-pay | Admitting: Nurse Practitioner

## 2019-12-14 ENCOUNTER — Encounter: Payer: Self-pay | Admitting: Nurse Practitioner

## 2019-12-14 ENCOUNTER — Ambulatory Visit: Payer: Medicare PPO | Admitting: Nurse Practitioner

## 2019-12-14 ENCOUNTER — Other Ambulatory Visit: Payer: Self-pay

## 2019-12-14 VITALS — BP 115/73 | HR 69 | Temp 98.6°F | Resp 20 | Ht 62.0 in | Wt 210.0 lb

## 2019-12-14 DIAGNOSIS — E782 Mixed hyperlipidemia: Secondary | ICD-10-CM

## 2019-12-14 DIAGNOSIS — D649 Anemia, unspecified: Secondary | ICD-10-CM | POA: Diagnosis not present

## 2019-12-14 DIAGNOSIS — E559 Vitamin D deficiency, unspecified: Secondary | ICD-10-CM

## 2019-12-14 DIAGNOSIS — R601 Generalized edema: Secondary | ICD-10-CM | POA: Diagnosis not present

## 2019-12-14 DIAGNOSIS — K219 Gastro-esophageal reflux disease without esophagitis: Secondary | ICD-10-CM

## 2019-12-14 DIAGNOSIS — I10 Essential (primary) hypertension: Secondary | ICD-10-CM

## 2019-12-14 MED ORDER — ROSUVASTATIN CALCIUM 20 MG PO TABS: ORAL_TABLET | ORAL | 5 refills | Status: DC

## 2019-12-14 MED ORDER — LISINOPRIL-HYDROCHLOROTHIAZIDE 20-12.5 MG PO TABS: 1.0000 | ORAL_TABLET | Freq: Every day | ORAL | 1 refills | Status: DC

## 2019-12-14 MED ORDER — OMEPRAZOLE 20 MG PO CPDR: 20.0000 mg | DELAYED_RELEASE_CAPSULE | Freq: Every day | ORAL | 1 refills | Status: DC

## 2019-12-14 NOTE — Progress Notes (Signed)
Subjective:    Patient ID: Sarah Benton, female    DOB: [birth year], 74 y.o.   MRN: [id number]   Chief Complaint: Medical Management of Chronic Issues    HPI:  1. Essential hypertension, benign Does not check BP at home. Does not watch sodium intake. Denies chest pain, sob, headaches. BP Readings from Last 3 Encounters:  06/10/19 (!) 109/58  03/08/19 122/74  08/11/18 114/63     2. Gastroesophageal reflux disease without esophagitis Takes prilosec and is working well.  3. Mixed hyperlipidemia Taking crestor 3 times a week. Is trying to watch fat and cholesterol in diet. Is not exercising. Lab Results  Component Value Date   CHOL 226 (H) 06/10/2019   HDL 47 06/10/2019   LDLCALC 145 (H) 06/10/2019   TRIG 186 (H) 06/10/2019   CHOLHDL 4.8 (H) 06/10/2019     4. Morbid obesity ([place]) Has lost 3 pounds since last visit. BMI Readings from Last 3 Encounters:  12/14/19 38.41 kg/m  06/10/19 38.96 kg/m  03/08/19 39.51 kg/m   Wt Readings from Last 3 Encounters:  12/14/19 210 lb (95.3 kg)  06/10/19 213 lb (96.6 kg)  03/08/19 216 lb (98 kg)     5. Normocytic anemia Denies fatigue. Lab Results  Component Value Date   HGB 13.7 06/10/2019    6. Generalized edema No problems at this time.  7. Vitamin D deficiency Takes vitamin D supplement daily.    Outpatient Encounter Medications as of 12/14/2019  Medication Sig  . chlorhexidine (PERIDEX) 0.12 % solution SMARTSIG:15 Milliliter(s) By Mouth Every Morning  . Cholecalciferol (VITAMIN D3) 2000 UNITS capsule Take 2,000 Units by mouth daily.    Marland Kitchen lisinopril-hydrochlorothiazide (ZESTORETIC) 20-12.5 MG tablet Take 1 tablet by mouth daily.  . meloxicam (MOBIC) 15 MG tablet TAKE 1 TABLET DAILY  . nystatin ointment (MYCOSTATIN) APPLY TO THE AFFECTEDoAREA(S) BY TOPICAL ROUTE 2 TIMES PER DAY  . omeprazole (PRILOSEC) 20 MG capsule Take 1 capsule (20 mg total) by mouth daily.  . rosuvastatin (CRESTOR) 20 MG tablet TAKE (1)  TABLET ON MONDAY, WEDNESDAY AND FRIDAY  . [DISCONTINUED] clindamycin (CLEOCIN) 300 MG capsule Take 300 mg by mouth every 8 (eight) hours.   No facility-administered encounter medications on file as of 12/14/2019.    Past Surgical History:  Procedure Laterality Date  . ABDOMINAL HYSTERECTOMY Bilateral December 19, 1999   complete  . BREAST BIOPSY  1993   Left/negative  . BREAST CYST EXCISION Left   . LUMBAR LAMINECTOMY  1998   ruptured disc  . TONSILLECTOMY    . TUBAL LIGATION  1979    Family History  Problem Relation Age of Onset  . Parkinsonism Mother 63  . Ovarian cancer Mother   . Hypertension Father   . Prostate cancer Father 68       met to bone / hip  . Arthritis Sister        knee  . Diabetes Daughter   . Diabetes Son        type 2  . Diabetes Grandchild 17       type 1  . Colon cancer Neg Hx   . Breast cancer Neg Hx     New complaints: None  Social history: Lives by herself-husband passed away in 12/18/2016.  Controlled substance contract: N/A     Review of Systems  Constitutional: Negative.   HENT: Negative.   Eyes: Negative.   Respiratory: Negative.   Cardiovascular: Negative.   Gastrointestinal: Negative.   Endocrine: Negative.  Genitourinary: Negative.   Musculoskeletal: Negative.   Skin: Negative.   Neurological: Negative.   Hematological: Negative.   Psychiatric/Behavioral: Negative.        Objective:   Physical Exam Vitals and nursing note reviewed.  HENT:     Head: Normocephalic.     Right Ear: Tympanic membrane normal.     Left Ear: Tympanic membrane normal.     Nose: Nose normal.     Mouth/Throat:     Mouth: Mucous membranes are moist.     Pharynx: Oropharynx is clear.  Eyes:     Conjunctiva/sclera: Conjunctivae normal.     Pupils: Pupils are equal, round, and reactive to light.  Cardiovascular:     Rate and Rhythm: Normal rate and regular rhythm.     Pulses: Normal pulses.     Heart sounds: Normal heart sounds.  Pulmonary:      Effort: Pulmonary effort is normal.     Breath sounds: Normal breath sounds.  Abdominal:     General: Bowel sounds are normal.     Palpations: Abdomen is soft.  Musculoskeletal:        General: Normal range of motion.     Cervical back: Normal range of motion and neck supple.  Skin:    General: Skin is warm and dry.     Capillary Refill: Capillary refill takes less than 2 seconds.  Neurological:     Mental Status: She is alert and oriented to person, place, and time.    BP 115/73   Pulse 69   Temp 98.6 F (37 C) (Temporal)   Resp 20   Ht '5\' 2"'  (1.575 m)   Wt 210 lb (95.3 kg)   SpO2 100%   BMI 38.41 kg/m       Assessment & Plan:  Sarah Benton comes in today with chief complaint of Medical Management of Chronic Issues   Diagnosis and orders addressed:  1. Essential hypertension, benign Low sodium diet. - lisinopril-hydrochlorothiazide (ZESTORETIC) 20-12.5 MG tablet; Take 1 tablet by mouth daily.  Dispense: 90 tablet; Refill: 1 - CBC with Differential/Platelet - CMP14+EGFR  2. Gastroesophageal reflux disease without esophagitis Avoid spicy foods Do not eat 2 hours prior to bedtime - omeprazole (PRILOSEC) 20 MG capsule; Take 1 capsule (20 mg total) by mouth daily.  Dispense: 90 capsule; Refill: 1  3. Mixed hyperlipidemia Low fat diet. Exercise as tolerated. - rosuvastatin (CRESTOR) 20 MG tablet; 1 po on m on,wed and fri  Dispense: 40 tablet; Refill: 5 - Lipid panel  4. Morbid obesity ([place]) Discussed diet and exercise for person with BMI >25 Will recheck weight in 3-6 months  5. Normocytic anemia   6. Generalized edema Elevate legs as needed  7. Vitamin D deficiency    Labs pending Health Maintenance reviewed Diet and exercise encouraged  Follow up plan: 6 months   [name], FNP

## 2019-12-15 LAB — CMP14+EGFR
ALT: 21 IU/L (ref 0–32)
AST: 21 IU/L (ref 0–40)
Albumin/Globulin Ratio: 2.2 (ref 1.2–2.2)
Albumin: 4.1 g/dL (ref 3.7–4.7)
Alkaline Phosphatase: 119 IU/L — ABNORMAL HIGH (ref 39–117)
BUN/Creatinine Ratio: 17 (ref 12–28)
BUN: 17 mg/dL (ref 8–27)
Bilirubin Total: 0.4 mg/dL (ref 0.0–1.2)
CO2: 26 mmol/L (ref 20–29)
Calcium: 9.3 mg/dL (ref 8.7–10.3)
Chloride: 104 mmol/L (ref 96–106)
Creatinine, Ser: 1.03 mg/dL — ABNORMAL HIGH (ref 0.57–1.00)
GFR calc Af Amer: 61 mL/min/{1.73_m2} (ref >59)
GFR calc non Af Amer: 53 mL/min/{1.73_m2} — ABNORMAL LOW (ref >59)
Globulin, Total: 1.9 g/dL (ref 1.5–4.5)
Glucose: 80 mg/dL (ref 65–99)
Potassium: 4.3 mmol/L (ref 3.5–5.2)
Sodium: 144 mmol/L (ref 134–144)
Total Protein: 6 g/dL (ref 6.0–8.5)

## 2019-12-15 LAB — CBC WITH DIFFERENTIAL/PLATELET
Basophils Absolute: 0.1 10*3/uL (ref 0.0–0.2)
Basos: 1 %
EOS (ABSOLUTE): 0.2 10*3/uL (ref 0.0–0.4)
Eos: 2 %
Hematocrit: 38.8 % (ref 34.0–46.6)
Hemoglobin: 13.2 g/dL (ref 11.1–15.9)
Immature Grans (Abs): 0 10*3/uL (ref 0.0–0.1)
Immature Granulocytes: 0 %
Lymphocytes Absolute: 2 10*3/uL (ref 0.7–3.1)
Lymphs: 24 %
MCH: 28.6 pg (ref 26.6–33.0)
MCHC: 34 g/dL (ref 31.5–35.7)
MCV: 84 fL (ref 79–97)
Monocytes Absolute: 0.6 10*3/uL (ref 0.1–0.9)
Monocytes: 7 %
Neutrophils Absolute: 5.4 10*3/uL (ref 1.4–7.0)
Neutrophils: 66 %
Platelets: 273 10*3/uL (ref 150–450)
RBC: 4.61 x10E6/uL (ref 3.77–5.28)
RDW: 13 % (ref 11.7–15.4)
WBC: 8.2 10*3/uL (ref 3.4–10.8)

## 2019-12-15 LAB — LIPID PANEL
Chol/HDL Ratio: 3.2 ratio (ref 0.0–4.4)
Cholesterol, Total: 171 mg/dL (ref 100–199)
HDL: 53 mg/dL (ref >39)
LDL Chol Calc (NIH): 96 mg/dL (ref 0–99)
Triglycerides: 125 mg/dL (ref 0–149)
VLDL Cholesterol Cal: 22 mg/dL (ref 5–40)

## 2020-01-28 ENCOUNTER — Other Ambulatory Visit: Payer: Self-pay | Admitting: Nurse Practitioner

## 2020-02-23 ENCOUNTER — Encounter: Payer: Self-pay | Admitting: Nurse Practitioner

## 2020-02-29 ENCOUNTER — Encounter: Payer: Self-pay | Admitting: Nurse Practitioner

## 2020-02-29 ENCOUNTER — Other Ambulatory Visit: Payer: Self-pay

## 2020-02-29 ENCOUNTER — Ambulatory Visit: Payer: Medicare PPO | Admitting: Nurse Practitioner

## 2020-02-29 VITALS — BP 116/64 | HR 65 | Temp 98.3°F | Resp 20 | Ht 62.0 in | Wt 211.0 lb

## 2020-02-29 DIAGNOSIS — M5430 Sciatica, unspecified side: Secondary | ICD-10-CM | POA: Diagnosis not present

## 2020-02-29 MED ORDER — PREDNISONE 10 MG (21) PO TBPK: ORAL_TABLET | ORAL | 0 refills | Status: DC

## 2020-02-29 NOTE — Patient Instructions (Signed)
Sciatica  Sciatica is pain, weakness, tingling, or loss of feeling (numbness) along the sciatic nerve. The sciatic nerve starts in the lower back and goes down the back of each leg. Sciatica usually goes away on its own or with treatment. Sometimes, sciatica may come back (recur). What are the causes? This condition happens when the sciatic nerve is pinched or has pressure put on it. This may be the result of:  A disk in between the bones of the spine bulging out too far (herniated disk).  Changes in the spinal disks that occur with aging.  A condition that affects a muscle in the butt.  Extra bone growth near the sciatic nerve.  A break (fracture) of the area between your hip bones (pelvis).  Pregnancy.  Tumor. This is rare. What increases the risk? You are more likely to develop this condition if you:  Play sports that put pressure or stress on the spine.  Have poor strength and ease of movement (flexibility).  Have had a back injury in the past.  Have had back surgery.  Sit for long periods of time.  Do activities that involve bending or lifting over and over again.  Are very overweight (obese). What are the signs or symptoms? Symptoms can vary from mild to very bad. They may include:  Any of these problems in the lower back, leg, hip, or butt: ? Mild tingling, loss of feeling, or dull aches. ? Burning sensations. ? Sharp pains.  Loss of feeling in the back of the calf or the sole of the foot.  Leg weakness.  Very bad back pain that makes it hard to move. These symptoms may get worse when you cough, sneeze, or laugh. They may also get worse when you sit or stand for long periods of time. How is this treated? This condition often gets better without any treatment. However, treatment may include:  Changing or cutting back on physical activity when you have pain.  Doing exercises and stretching.  Putting ice or heat on the affected area.  Medicines that  help: ? To relieve pain and swelling. ? To relax your muscles.  Shots (injections) of medicines that help to relieve pain, irritation, and swelling.  Surgery. Follow these instructions at home: Medicines  Take over-the-counter and prescription medicines only as told by your doctor.  Ask your doctor if the medicine prescribed to you: ? Requires you to avoid driving or using heavy machinery. ? Can cause trouble pooping (constipation). You may need to take these steps to prevent or treat trouble pooping:  Drink enough fluids to keep your pee (urine) pale yellow.  Take over-the-counter or prescription medicines.  Eat foods that are high in fiber. These include beans, whole grains, and fresh fruits and vegetables.  Limit foods that are high in fat and sugar. These include fried or sweet foods. Managing pain      If told, put ice on the affected area. ? Put ice in a plastic bag. ? Place a towel between your skin and the bag. ? Leave the ice on for 20 minutes, 2-3 times a day.  If told, put heat on the affected area. Use the heat source that your doctor tells you to use, such as a moist heat pack or a heating pad. ? Place a towel between your skin and the heat source. ? Leave the heat on for 20-30 minutes. ? Remove the heat if your skin turns bright red. This is very important if you are   unable to feel pain, heat, or cold. You may have a greater risk of getting burned. Activity   Return to your normal activities as told by your doctor. Ask your doctor what activities are safe for you.  Avoid activities that make your symptoms worse.  Take short rests during the day. ? When you rest for a long time, do some physical activity or stretching between periods of rest. ? Avoid sitting for a long time without moving. Get up and move around at least one time each hour.  Exercise and stretch regularly, as told by your doctor.  Do not lift anything that is heavier than 10 lb (4.5 kg)  while you have symptoms of sciatica. ? Avoid lifting heavy things even when you do not have symptoms. ? Avoid lifting heavy things over and over.  When you lift objects, always lift in a way that is safe for your body. To do this, you should: ? Bend your knees. ? Keep the object close to your body. ? Avoid twisting. General instructions  Stay at a healthy weight.  Wear comfortable shoes that support your feet. Avoid wearing high heels.  Avoid sleeping on a mattress that is too soft or too hard. You might have less pain if you sleep on a mattress that is firm enough to support your back.  Keep all follow-up visits as told by your doctor. This is important. Contact a doctor if:  You have pain that: ? Wakes you up when you are sleeping. ? Gets worse when you lie down. ? Is worse than the pain you have had in the past. ? Lasts longer than 4 weeks.  You lose weight without trying. Get help right away if:  You cannot control when you pee (urinate) or poop (have a bowel movement).  You have weakness in any of these areas and it gets worse: ? Lower back. ? The area between your hip bones. ? Butt. ? Legs.  You have redness or swelling of your back.  You have a burning feeling when you pee. Summary  Sciatica is pain, weakness, tingling, or loss of feeling (numbness) along the sciatic nerve.  This condition happens when the sciatic nerve is pinched or has pressure put on it.  Sciatica can cause pain, tingling, or loss of feeling (numbness) in the lower back, legs, hips, and butt.  Treatment often includes rest, exercise, medicines, and putting ice or heat on the affected area. This information is not intended to replace advice given to you by your health care provider. Make sure you discuss any questions you have with your health care provider. Document Revised: 09/28/2018 Document Reviewed: 09/28/2018 Elsevier Patient Education  2020 [employer]  

## 2020-02-29 NOTE — Progress Notes (Signed)
Subjective:    Patient ID: Sarah Benton, female    DOB: 05/09/1946, 74 y.o.   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 Laying down makes it worse & she has not found anything that gives her relief. NSAIDs without relief.   Review of Systems  Constitutional: Negative for diaphoresis.  Eyes: Negative for pain.  Respiratory: Negative for shortness of breath.   Cardiovascular: Negative for chest pain, palpitations and leg swelling.  Gastrointestinal: Negative for abdominal pain.  Endocrine: Negative for polydipsia.  Skin: Negative for rash.  Neurological: Negative for dizziness, weakness and headaches.  Hematological: Does not bruise/bleed easily.  All other systems reviewed and are negative.      Objective:   Physical Exam Constitutional:      Appearance: Normal appearance.  HENT:     Head: Normocephalic.     Right Ear: Tympanic membrane normal.     Left Ear: Tympanic membrane normal.     Mouth/Throat:     Mouth: Mucous membranes are moist.     Pharynx: Oropharynx is clear.  Eyes:     Extraocular Movements: Extraocular movements intact.     Pupils: Pupils are equal, round, and reactive to light.  Cardiovascular:     Rate and Rhythm: Normal rate and regular rhythm.     Pulses:          Popliteal pulses are 2+ on the right side and 1+ on the left side.  Abdominal:     General: Bowel sounds are normal.     Palpations: Abdomen is soft.  Musculoskeletal:        General: Normal range of motion.     Cervical back: Normal range of motion and neck supple.     Right hip: Normal.     Left hip: Normal.     Right upper leg: Normal.     Left upper leg: Normal.     Right lower leg: Normal.     Left lower leg: Normal.     Right ankle: Normal.     Left ankle: Normal.   Right foot: Normal.     Left foot: Normal.     Comments: FROM of lumbar spine without pain (-) LR bil Motor strength and sensation distally intact   Skin:    General: Skin is warm and dry.     Capillary Refill: Capillary refill takes less than 2 seconds.  Neurological:     General: No focal deficit present.     Mental Status: She is alert and oriented to person, place, and time. Mental status is at baseline.  Psychiatric:        Mood and Affect: Mood normal.        Behavior: Behavior normal.        Thought Content: Thought content normal.        Judgment: Judgment normal.   BP 116/64   Pulse 65   Temp 98.3 F (36.8 C) (Temporal)   Resp 20   Ht 5\' 2"  (1.575 m)   Wt 211 lb (95.7 kg)   SpO2 99%   BMI 38.59 kg/m          Assessment & Plan:  Sarah Benton in today with chief complaint of Sciatic nerve pain and Check right ear  1. Sciatic leg pain Take full prednisone pack to help with inflammation and pain for your sciatic pain. Do lower back and hip-flexor stretching to help keep the muscles and ligaments in these areas loose.   Meds ordered this encounter  Medications  . predniSONE (STERAPRED UNI-PAK 21 TAB) 10 MG (21) TBPK tablet    Sig: As directed x 6 days    Dispense:  21 tablet    Refill:  0    Order Specific Question:   Supervising Provider    Answer:   Caryl Pina A [[id number]]      The above assessment and management plan was discussed with the patient. The patient verbalized understanding of and has agreed to the management plan. Patient is aware to call the clinic if symptoms persist or worsen. Patient is aware when to return to the clinic for a follow-up visit. Patient educated on when it is appropriate to go to the emergency department.   [place], FNP '

## 2020-03-31 DIAGNOSIS — B372 Candidiasis of skin and nail: Secondary | ICD-10-CM | POA: Diagnosis not present

## 2020-03-31 DIAGNOSIS — Z6837 Body mass index (BMI) 37.0-37.9, adult: Secondary | ICD-10-CM | POA: Diagnosis not present

## 2020-03-31 DIAGNOSIS — Z01419 Encounter for gynecological examination (general) (routine) without abnormal findings: Secondary | ICD-10-CM | POA: Diagnosis not present

## 2020-05-01 ENCOUNTER — Other Ambulatory Visit: Payer: Self-pay | Admitting: Nurse Practitioner

## 2020-06-20 ENCOUNTER — Ambulatory Visit (INDEPENDENT_AMBULATORY_CARE_PROVIDER_SITE_OTHER): Payer: Medicare PPO

## 2020-06-20 ENCOUNTER — Ambulatory Visit: Payer: Medicare PPO | Admitting: Nurse Practitioner

## 2020-06-20 ENCOUNTER — Encounter: Payer: Self-pay | Admitting: Nurse Practitioner

## 2020-06-20 ENCOUNTER — Other Ambulatory Visit: Payer: Self-pay

## 2020-06-20 VITALS — BP 133/79 | HR 51 | Temp 98.2°F | Resp 20 | Ht 62.0 in | Wt 213.0 lb

## 2020-06-20 DIAGNOSIS — K219 Gastro-esophageal reflux disease without esophagitis: Secondary | ICD-10-CM

## 2020-06-20 DIAGNOSIS — Z13820 Encounter for screening for osteoporosis: Secondary | ICD-10-CM

## 2020-06-20 DIAGNOSIS — Z23 Encounter for immunization: Secondary | ICD-10-CM

## 2020-06-20 DIAGNOSIS — B372 Candidiasis of skin and nail: Secondary | ICD-10-CM

## 2020-06-20 DIAGNOSIS — E782 Mixed hyperlipidemia: Secondary | ICD-10-CM | POA: Diagnosis not present

## 2020-06-20 DIAGNOSIS — I10 Essential (primary) hypertension: Secondary | ICD-10-CM | POA: Diagnosis not present

## 2020-06-20 DIAGNOSIS — Z780 Asymptomatic menopausal state: Secondary | ICD-10-CM

## 2020-06-20 LAB — CBC WITH DIFFERENTIAL/PLATELET
Basophils Absolute: 0.1 10*3/uL (ref 0.0–0.2)
Basos: 1 %
EOS (ABSOLUTE): 0.2 10*3/uL (ref 0.0–0.4)
Eos: 3 %
Hematocrit: 39.9 % (ref 34.0–46.6)
Hemoglobin: 13.1 g/dL (ref 11.1–15.9)
Immature Grans (Abs): 0 10*3/uL (ref 0.0–0.1)
Immature Granulocytes: 0 %
Lymphocytes Absolute: 2 10*3/uL (ref 0.7–3.1)
Lymphs: 26 %
MCH: 27.8 pg (ref 26.6–33.0)
MCHC: 32.8 g/dL (ref 31.5–35.7)
MCV: 85 fL (ref 79–97)
Monocytes Absolute: 0.5 10*3/uL (ref 0.1–0.9)
Monocytes: 6 %
Neutrophils Absolute: 4.9 10*3/uL (ref 1.4–7.0)
Neutrophils: 64 %
Platelets: 266 10*3/uL (ref 150–450)
RBC: 4.71 x10E6/uL (ref 3.77–5.28)
RDW: 12.5 % (ref 11.7–15.4)
WBC: 7.5 10*3/uL (ref 3.4–10.8)

## 2020-06-20 LAB — CMP14+EGFR
ALT: 23 IU/L (ref 0–32)
AST: 21 IU/L (ref 0–40)
Albumin/Globulin Ratio: 2.1 (ref 1.2–2.2)
Albumin: 4.2 g/dL (ref 3.7–4.7)
Alkaline Phosphatase: 123 IU/L — ABNORMAL HIGH (ref 44–121)
BUN/Creatinine Ratio: 18 (ref 12–28)
BUN: 21 mg/dL (ref 8–27)
Bilirubin Total: 0.4 mg/dL (ref 0.0–1.2)
CO2: 28 mmol/L (ref 20–29)
Calcium: 9.5 mg/dL (ref 8.7–10.3)
Chloride: 102 mmol/L (ref 96–106)
Creatinine, Ser: 1.18 mg/dL — ABNORMAL HIGH (ref 0.57–1.00)
GFR calc Af Amer: 52 mL/min/{1.73_m2} — ABNORMAL LOW (ref >59)
GFR calc non Af Amer: 45 mL/min/{1.73_m2} — ABNORMAL LOW (ref >59)
Globulin, Total: 2 g/dL (ref 1.5–4.5)
Glucose: 115 mg/dL — ABNORMAL HIGH (ref 65–99)
Potassium: 4.6 mmol/L (ref 3.5–5.2)
Sodium: 142 mmol/L (ref 134–144)
Total Protein: 6.2 g/dL (ref 6.0–8.5)

## 2020-06-20 LAB — LIPID PANEL
Chol/HDL Ratio: 3.9 ratio (ref 0.0–4.4)
Cholesterol, Total: 197 mg/dL (ref 100–199)
HDL: 50 mg/dL (ref >39)
LDL Chol Calc (NIH): 120 mg/dL — ABNORMAL HIGH (ref 0–99)
Triglycerides: 154 mg/dL — ABNORMAL HIGH (ref 0–149)
VLDL Cholesterol Cal: 27 mg/dL (ref 5–40)

## 2020-06-20 MED ORDER — ROSUVASTATIN CALCIUM 20 MG PO TABS: ORAL_TABLET | ORAL | 5 refills | Status: DC

## 2020-06-20 MED ORDER — NYSTATIN 100000 UNIT/GM EX OINT: TOPICAL_OINTMENT | CUTANEOUS | 2 refills | Status: DC

## 2020-06-20 MED ORDER — LISINOPRIL-HYDROCHLOROTHIAZIDE 20-12.5 MG PO TABS: 1.0000 | ORAL_TABLET | Freq: Every day | ORAL | 1 refills | Status: DC

## 2020-06-20 MED ORDER — FLUCONAZOLE 150 MG PO TABS: ORAL_TABLET | ORAL | 0 refills | Status: DC

## 2020-06-20 MED ORDER — OMEPRAZOLE 20 MG PO CPDR: 20.0000 mg | DELAYED_RELEASE_CAPSULE | Freq: Every day | ORAL | 1 refills | Status: DC

## 2020-06-20 NOTE — Patient Instructions (Signed)
DASH Eating Plan DASH stands for "Dietary Approaches to Stop Hypertension." The DASH eating plan is a healthy eating plan that has been shown to reduce high blood pressure (hypertension). It may also reduce your risk for type 2 diabetes, heart disease, and stroke. The DASH eating plan may also help with weight loss. What are tips for following this plan?  General guidelines  Avoid eating more than 2,300 mg (milligrams) of salt (sodium) a day. If you have hypertension, you may need to reduce your sodium intake to 1,500 mg a day.  Limit alcohol intake to no more than 1 drink a day for nonpregnant women and 2 drinks a day for men. One drink equals 12 oz of beer, 5 oz of wine, or 1 oz of hard liquor.  Work with your health care provider to maintain a healthy body weight or to lose weight. Ask what an ideal weight is for you.  Get at least 30 minutes of exercise that causes your heart to beat faster (aerobic exercise) most days of the week. Activities may include walking, swimming, or biking.  Work with your health care provider or diet and nutrition specialist (dietitian) to adjust your eating plan to your individual calorie needs. Reading food labels   Check food labels for the amount of sodium per serving. Choose foods with less than 5 percent of the Daily Value of sodium. Generally, foods with less than 300 mg of sodium per serving fit into this eating plan.  To find whole grains, look for the word "whole" as the first word in the ingredient list. Shopping  Buy products labeled as "low-sodium" or "no salt added."  Buy fresh foods. Avoid canned foods and premade or frozen meals. Cooking  Avoid adding salt when cooking. Use salt-free seasonings or herbs instead of table salt or sea salt. Check with your health care provider or pharmacist before using salt substitutes.  Do not fry foods. Cook foods using healthy methods such as baking, boiling, grilling, and broiling instead.  Cook with  heart-healthy oils, such as olive, canola, soybean, or sunflower oil. Meal planning  Eat a balanced diet that includes: ? 5 or more servings of fruits and vegetables each day. At each meal, try to fill half of your plate with fruits and vegetables. ? Up to 6-8 servings of whole grains each day. ? Less than 6 oz of lean meat, poultry, or fish each day. A 3-oz serving of meat is about the same size as a deck of cards. One egg equals 1 oz. ? 2 servings of low-fat dairy each day. ? A serving of nuts, seeds, or beans 5 times each week. ? Heart-healthy fats. Healthy fats called Omega-3 fatty acids are found in foods such as flaxseeds and coldwater fish, like sardines, salmon, and mackerel.  Limit how much you eat of the following: ? Canned or prepackaged foods. ? Food that is high in trans fat, such as fried foods. ? Food that is high in saturated fat, such as fatty meat. ? Sweets, desserts, sugary drinks, and other foods with added sugar. ? Full-fat dairy products.  Do not salt foods before eating.  Try to eat at least 2 vegetarian meals each week.  Eat more home-cooked food and less restaurant, buffet, and fast food.  When eating at a restaurant, ask that your food be prepared with less salt or no salt, if possible. What foods are recommended? The items listed may not be a complete list. Talk with your dietitian about   what dietary choices are best for you. Grains Whole-grain or whole-wheat bread. Whole-grain or whole-wheat pasta. Brown rice. Oatmeal. Quinoa. Bulgur. Whole-grain and low-sodium cereals. Pita bread. Low-fat, low-sodium crackers. Whole-wheat flour tortillas. Vegetables Fresh or frozen vegetables (raw, steamed, roasted, or grilled). Low-sodium or reduced-sodium tomato and vegetable juice. Low-sodium or reduced-sodium tomato sauce and tomato paste. Low-sodium or reduced-sodium canned vegetables. Fruits All fresh, dried, or frozen fruit. Canned fruit in natural juice (without  added sugar). Meat and other protein foods Skinless chicken or turkey. Ground chicken or turkey. Pork with fat trimmed off. Fish and seafood. Egg whites. Dried beans, peas, or lentils. Unsalted nuts, nut butters, and seeds. Unsalted canned beans. Lean cuts of beef with fat trimmed off. Low-sodium, lean deli meat. Dairy Low-fat (1%) or fat-free (skim) milk. Fat-free, low-fat, or reduced-fat cheeses. Nonfat, low-sodium ricotta or cottage cheese. Low-fat or nonfat yogurt. Low-fat, low-sodium cheese. Fats and oils Soft margarine without trans fats. Vegetable oil. Low-fat, reduced-fat, or light mayonnaise and salad dressings (reduced-sodium). Canola, safflower, olive, soybean, and sunflower oils. Avocado. Seasoning and other foods Herbs. Spices. Seasoning mixes without salt. Unsalted popcorn and pretzels. Fat-free sweets. What foods are not recommended? The items listed may not be a complete list. Talk with your dietitian about what dietary choices are best for you. Grains Baked goods made with fat, such as croissants, muffins, or some breads. Dry pasta or rice meal packs. Vegetables Creamed or fried vegetables. Vegetables in a cheese sauce. Regular canned vegetables (not low-sodium or reduced-sodium). Regular canned tomato sauce and paste (not low-sodium or reduced-sodium). Regular tomato and vegetable juice (not low-sodium or reduced-sodium). Pickles. Olives. Fruits Canned fruit in a light or heavy syrup. Fried fruit. Fruit in cream or butter sauce. Meat and other protein foods Fatty cuts of meat. Ribs. Fried meat. Bacon. Sausage. Bologna and other processed lunch meats. Salami. Fatback. Hotdogs. Bratwurst. Salted nuts and seeds. Canned beans with added salt. Canned or smoked fish. Whole eggs or egg yolks. Chicken or turkey with skin. Dairy Whole or 2% milk, cream, and half-and-half. Whole or full-fat cream cheese. Whole-fat or sweetened yogurt. Full-fat cheese. Nondairy creamers. Whipped toppings.  Processed cheese and cheese spreads. Fats and oils Butter. Stick margarine. Lard. Shortening. Ghee. Bacon fat. Tropical oils, such as coconut, palm kernel, or palm oil. Seasoning and other foods Salted popcorn and pretzels. Onion salt, garlic salt, seasoned salt, table salt, and sea salt. Worcestershire sauce. Tartar sauce. Barbecue sauce. Teriyaki sauce. Soy sauce, including reduced-sodium. Steak sauce. Canned and packaged gravies. Fish sauce. Oyster sauce. Cocktail sauce. Horseradish that you find on the shelf. Ketchup. Mustard. Meat flavorings and tenderizers. Bouillon cubes. Hot sauce and Tabasco sauce. Premade or packaged marinades. Premade or packaged taco seasonings. Relishes. Regular salad dressings. Where to find more information:  National Heart, Lung, and Blood Institute: www.nhlbi.nih.gov  American Heart Association: www.heart.org Summary  The DASH eating plan is a healthy eating plan that has been shown to reduce high blood pressure (hypertension). It may also reduce your risk for type 2 diabetes, heart disease, and stroke.  With the DASH eating plan, you should limit salt (sodium) intake to 2,300 mg a day. If you have hypertension, you may need to reduce your sodium intake to 1,500 mg a day.  When on the DASH eating plan, aim to eat more fresh fruits and vegetables, whole grains, lean proteins, low-fat dairy, and heart-healthy fats.  Work with your health care provider or diet and nutrition specialist (dietitian) to adjust your eating plan to your   individual calorie needs. This information is not intended to replace advice given to you by your health care provider. Make sure you discuss any questions you have with your health care provider. Document Revised: 08/22/2017 Document Reviewed: 09/02/2016 Elsevier Patient Education  2020 Elsevier Inc.  

## 2020-06-20 NOTE — Progress Notes (Signed)
Subjective:    Patient ID: Sarah Benton, female    DOB: 24-Jun-1946, 74 y.o.   MRN: [id number]  Chief Complaint: Medical Management of Chronic Issues    HPI:  1. Essential hypertension, benign BP Readings from Last 3 Encounters:  06/20/20 133/79  02/29/20 116/64  12/14/19 115/73    Does not check blood pressure at home regularly. Takes medication as prescribed. No chest pain, SOB, or headaches.   2. Mixed hyperlipidemia Lab Results  Component Value Date   CHOL 171 12/14/2019   HDL 53 12/14/2019   LDLCALC 96 12/14/2019   TRIG 125 12/14/2019   CHOLHDL 3.2 12/14/2019  Avoids fried and fatty foods. Takes medication as prescribed.    3. Gastroesophageal reflux disease without esophagitis Takes medication as prescribed, denies any reflux issues.   4. Morbid obesity No recent weight changes Wt Readings from Last 3 Encounters:  06/20/20 213 lb (96.6 kg)  02/29/20 211 lb (95.7 kg)  12/14/19 210 lb (95.3 kg)   BMI Readings from Last 3 Encounters:  06/20/20 38.96 kg/m  02/29/20 38.59 kg/m  12/14/19 38.41 kg/m     Outpatient Encounter Medications as of 06/20/2020  Medication Sig  . Cholecalciferol (VITAMIN D3) 2000 UNITS capsule Take 2,000 Units by mouth daily.    Marland Kitchen lisinopril-hydrochlorothiazide (ZESTORETIC) 20-12.5 MG tablet Take 1 tablet by mouth daily.  . meloxicam (MOBIC) 15 MG tablet TAKE 1 TABLET DAILY  . nystatin ointment (MYCOSTATIN) APPLY TO THE AFFECTEDoAREA(S) BY TOPICAL ROUTE 2 TIMES PER DAY  . omeprazole (PRILOSEC) 20 MG capsule Take 1 capsule (20 mg total) by mouth daily.  . rosuvastatin (CRESTOR) 20 MG tablet 1 po on m on,wed and fri     Past Surgical History:  Procedure Laterality Date  . ABDOMINAL HYSTERECTOMY Bilateral 2001   complete  . BREAST BIOPSY  1993   Left/negative  . BREAST CYST EXCISION Left   . LUMBAR LAMINECTOMY  1998   ruptured disc  . TONSILLECTOMY    . TUBAL LIGATION  1979    Family History  Problem Relation Age of  Onset  . Parkinsonism Mother 17  . Ovarian cancer Mother   . Hypertension Father   . Prostate cancer Father 41       met to bone / hip  . Arthritis Sister        knee  . Diabetes Daughter   . Diabetes Son        type 2  . Diabetes Grandchild 17       type 1  . Colon cancer Neg Hx   . Breast cancer Neg Hx     New complaints: Rash around lower abd band x 1 year, was given Nystatin cream that provides some relief. Can itch sometimes and burns.   Social history: Lives at home alone, is close with children, keeps special needs granddaughter 3 x week.   Controlled substance contract: n/a     Review of Systems  Constitutional: Negative.   HENT: Negative.   Eyes: Negative.   Respiratory: Negative.   Cardiovascular: Negative.   Gastrointestinal: Negative.   Endocrine: Negative.   Genitourinary: Negative.   Musculoskeletal: Negative.   Skin: Negative.   Allergic/Immunologic: Negative.   Neurological: Negative.   Hematological: Negative.   Psychiatric/Behavioral: Negative.   All other systems reviewed and are negative.      Objective:   Physical Exam Vitals and nursing note reviewed.  Constitutional:      Appearance: Normal appearance.  HENT:  Head: Normocephalic and atraumatic.     Right Ear: Tympanic membrane, ear canal and external ear normal.     Left Ear: Tympanic membrane and external ear normal. There is impacted cerumen.     Nose: Nose normal.     Mouth/Throat:     Mouth: Mucous membranes are moist.     Pharynx: Oropharynx is clear.  Eyes:     Extraocular Movements: Extraocular movements intact.     Conjunctiva/sclera: Conjunctivae normal.     Pupils: Pupils are equal, round, and reactive to light.  Cardiovascular:     Rate and Rhythm: Normal rate and regular rhythm.     Pulses: Normal pulses.     Heart sounds: Normal heart sounds.  Pulmonary:     Effort: Pulmonary effort is normal.     Breath sounds: Normal breath sounds.  Abdominal:      General: Bowel sounds are normal.  Musculoskeletal:        General: Normal range of motion.     Cervical back: Normal range of motion.  Skin:    General: Skin is warm and dry.     Capillary Refill: Capillary refill takes less than 2 seconds.     Findings: Rash present. Rash is urticarial.       Neurological:     General: No focal deficit present.     Mental Status: She is alert and oriented to person, place, and time. Mental status is at baseline.  Psychiatric:        Mood and Affect: Mood normal.        Behavior: Behavior normal.        Thought Content: Thought content normal.        Judgment: Judgment normal.    BP 133/79   Pulse (!) 51   Temp 98.2 F (36.8 C) (Temporal)   Resp 20   Ht _0  (1.575 m)   Wt 213 lb (96.6 kg)   BMI 38.96 kg/m       Assessment & Plan:  Sarah Benton comes in today with chief complaint of Medical Management of Chronic Issues (Rash under belly)   Diagnosis and orders addressed:  1. Essential hypertension, benign Low sodium diet - CBC with Differential/Platelet - CMP14+EGFR - lisinopril-hydrochlorothiazide (ZESTORETIC) 20-12.5 MG tablet; Take 1 tablet by mouth daily.  Dispense: 90 tablet; Refill: 1  2. Mixed hyperlipidemia Low fat diet - Lipid panel - rosuvastatin (CRESTOR) 20 MG tablet; 1 po on m on,wed and fri  Dispense: 40 tablet; Refill: 5  3. Gastroesophageal reflux disease without esophagitis Avoid spicy foods Do not eat 2 hours prior to bedtime - omeprazole (PRILOSEC) 20 MG capsule; Take 1 capsule (20 mg total) by mouth daily.  Dispense: 90 capsule; Refill: 1  4. Cutaneous candidiasis Keep area dry - nystatin ointment (MYCOSTATIN); APPLY TO THE AFFECTEDoAREA(S) BY TOPICAL ROUTE 2 TIMES PER DAY  Dispense: 30 g; Refill: 2 - fluconazole (DIFLUCAN) 150 MG tablet; 1 po q week x 4 weeks  Dispense: 4 tablet; Refill: 0  5. Morbid obesity ([place]) Discussed diet and exercise for person with BMI >25 Will recheck weight in 3-6  months    Labs pending Health Maintenance reviewed Diet and exercise encouraged  Follow up plan: 6 months   [name], FNP

## 2020-07-05 ENCOUNTER — Ambulatory Visit: Payer: Self-pay

## 2020-07-16 ENCOUNTER — Encounter: Payer: Self-pay | Admitting: Nurse Practitioner

## 2020-07-17 ENCOUNTER — Other Ambulatory Visit: Payer: Self-pay | Admitting: Nurse Practitioner

## 2020-07-17 MED ORDER — FLUCONAZOLE 150 MG PO TABS: ORAL_TABLET | ORAL | 0 refills | Status: DC

## 2020-07-29 ENCOUNTER — Other Ambulatory Visit: Payer: Self-pay | Admitting: Nurse Practitioner

## 2020-08-29 DIAGNOSIS — L304 Erythema intertrigo: Secondary | ICD-10-CM | POA: Diagnosis not present

## 2020-08-29 DIAGNOSIS — Z1283 Encounter for screening for malignant neoplasm of skin: Secondary | ICD-10-CM | POA: Diagnosis not present

## 2020-08-29 DIAGNOSIS — L821 Other seborrheic keratosis: Secondary | ICD-10-CM | POA: Diagnosis not present

## 2020-08-29 DIAGNOSIS — L820 Inflamed seborrheic keratosis: Secondary | ICD-10-CM | POA: Diagnosis not present

## 2020-08-29 DIAGNOSIS — B078 Other viral warts: Secondary | ICD-10-CM | POA: Diagnosis not present

## 2020-09-05 ENCOUNTER — Other Ambulatory Visit: Payer: Self-pay | Admitting: Obstetrics and Gynecology

## 2020-09-05 DIAGNOSIS — Z1231 Encounter for screening mammogram for malignant neoplasm of breast: Secondary | ICD-10-CM

## 2020-10-24 ENCOUNTER — Other Ambulatory Visit: Payer: Self-pay

## 2020-10-24 ENCOUNTER — Ambulatory Visit
Admission: RE | Admit: 2020-10-24 | Discharge: 2020-10-24 | Disposition: A | Discharge status: Home / Self Care | Payer: Medicare PPO | Source: Ambulatory Visit | Attending: Obstetrics and Gynecology | Admitting: Obstetrics and Gynecology

## 2020-10-24 DIAGNOSIS — Z1231 Encounter for screening mammogram for malignant neoplasm of breast: Secondary | ICD-10-CM | POA: Diagnosis not present

## 2020-10-24 DIAGNOSIS — L304 Erythema intertrigo: Secondary | ICD-10-CM | POA: Diagnosis not present

## 2020-10-24 DIAGNOSIS — B078 Other viral warts: Secondary | ICD-10-CM | POA: Diagnosis not present

## 2020-10-28 ENCOUNTER — Other Ambulatory Visit: Payer: Self-pay | Admitting: Nurse Practitioner

## 2020-11-20 ENCOUNTER — Encounter: Payer: Self-pay | Admitting: Family Medicine

## 2020-11-20 ENCOUNTER — Ambulatory Visit: Payer: Medicare PPO | Admitting: Family Medicine

## 2020-11-20 DIAGNOSIS — R112 Nausea with vomiting, unspecified: Secondary | ICD-10-CM

## 2020-11-20 DIAGNOSIS — R42 Dizziness and giddiness: Secondary | ICD-10-CM

## 2020-11-20 DIAGNOSIS — H9201 Otalgia, right ear: Secondary | ICD-10-CM

## 2020-11-20 MED ORDER — MECLIZINE HCL 12.5 MG PO TABS: 12.5000 mg | ORAL_TABLET | Freq: Three times a day (TID) | ORAL | 0 refills | Status: DC | PRN

## 2020-11-20 MED ORDER — AZITHROMYCIN 250 MG PO TABS: ORAL_TABLET | ORAL | 0 refills | Status: DC

## 2020-11-20 NOTE — Progress Notes (Signed)
Virtual Visit via telephone Note Due to COVID-19 pandemic this visit was conducted virtually. This visit type was conducted due to national recommendations for restrictions regarding the COVID-19 Pandemic (e.g. social distancing, sheltering in place) in an effort to limit this patient's exposure and mitigate transmission in our community. All issues noted in this document were discussed and addressed.  A physical exam was not performed with this format.  I connected with Sarah Benton on 11/20/20 at 0830 by telephone and verified that I am speaking with the correct person using two identifiers. Sarah Benton is currently located at home and no one is currently with her during the visit. The provider, Gwenlyn Perking, FNP is located in their office at time of visit.  I discussed the limitations, risks, security and privacy concerns of performing an evaluation and management service by telephone and the availability of in person appointments. I also discussed with the patient that there may be a patient responsible charge related to this service. The patient expressed understanding and agreed to proceed.  CC: right ear pain  History and Present Illness:  HPI  Sarah Benton reports right ear pain x 1 day. The pain is constant and is a 6/10. She denies fever or drainage from her ear. Denies swelling. She did have a root canal done on the right side 6 days ago and the oral surgeon told her she did have an infection in that tooth. She was not given an antibiotic. She does have some discomfort in her mouth but no significant pain. She also reports chills, nausea, and 2 episodes of vomiting that started 2 days ago. She has had dizziness that is described as the room spinning for 2 days as well. She denies congestion, cough, shortness of breath, or chest pain. Denies abdominal pain.    ROS As per HPI.   Observations/Objective: Alert and oriented x 3. Able to speak in full sentences without  difficulty.    Assessment and Plan: Jamirra was seen today for otalgia.  Diagnoses and all orders for this visit:  Right ear pain ? Referred pain. No fever, drainage, or congestion per patient. Recent root canal with reported infection that was not treated with antibiotics. Will treat for possible dental infection. Return to office for new or worsening symptoms, or if symptoms persist.  -     azithromycin (ZITHROMAX Z-PAK) 250 MG tablet; As directed  Vertigo Meclizine ordered as below.  -     meclizine (ANTIVERT) 12.5 MG tablet; Take 1 tablet (12.5 mg total) by mouth 3 (three) times daily as needed for dizziness.  Nausea with vomiting Patient will take a home Covid test and notify provider if it is positive. Discussed nausea and vomiting could be cause by vertigo or infection. Meclizine for vertigo and antibiotics for possible dental infection. Return to office for new or worsening symptoms, or if symptoms persist.   Follow Up Instructions: Return to office for new or worsening symptoms, or if symptoms persist.     I discussed the assessment and treatment plan with the patient. The patient was provided an opportunity to ask questions and all were answered. The patient agreed with the plan and demonstrated an understanding of the instructions.   The patient was advised to call back or seek an in-person evaluation if the symptoms worsen or if the condition fails to improve as anticipated.  The above assessment and management plan was discussed with the patient. The patient verbalized understanding of and has agreed to  the management plan. Patient is aware to call the clinic if symptoms persist or worsen. Patient is aware when to return to the clinic for a follow-up visit. Patient educated on when it is appropriate to go to the emergency department.   Time call ended:  0850  I provided 13 minutes of phone time during this encounter.    Gwenlyn Perking, FNP

## 2020-12-12 DIAGNOSIS — H5203 Hypermetropia, bilateral: Secondary | ICD-10-CM | POA: Diagnosis not present

## 2020-12-12 DIAGNOSIS — H25813 Combined forms of age-related cataract, bilateral: Secondary | ICD-10-CM | POA: Diagnosis not present

## 2020-12-12 DIAGNOSIS — H524 Presbyopia: Secondary | ICD-10-CM | POA: Diagnosis not present

## 2020-12-18 ENCOUNTER — Ambulatory Visit: Payer: Self-pay | Admitting: Nurse Practitioner

## 2020-12-19 ENCOUNTER — Ambulatory Visit (INDEPENDENT_AMBULATORY_CARE_PROVIDER_SITE_OTHER): Payer: Medicare PPO

## 2020-12-19 ENCOUNTER — Other Ambulatory Visit: Payer: Self-pay

## 2020-12-19 ENCOUNTER — Encounter: Payer: Self-pay | Admitting: Nurse Practitioner

## 2020-12-19 ENCOUNTER — Ambulatory Visit: Payer: Medicare PPO | Admitting: Nurse Practitioner

## 2020-12-19 VITALS — BP 124/68 | HR 64 | Temp 98.1°F | Resp 20 | Ht 62.0 in | Wt 212.0 lb

## 2020-12-19 DIAGNOSIS — E782 Mixed hyperlipidemia: Secondary | ICD-10-CM | POA: Diagnosis not present

## 2020-12-19 DIAGNOSIS — I10 Essential (primary) hypertension: Secondary | ICD-10-CM

## 2020-12-19 DIAGNOSIS — E559 Vitamin D deficiency, unspecified: Secondary | ICD-10-CM | POA: Diagnosis not present

## 2020-12-19 DIAGNOSIS — M5430 Sciatica, unspecified side: Secondary | ICD-10-CM

## 2020-12-19 DIAGNOSIS — D649 Anemia, unspecified: Secondary | ICD-10-CM

## 2020-12-19 DIAGNOSIS — Z136 Encounter for screening for cardiovascular disorders: Secondary | ICD-10-CM | POA: Diagnosis not present

## 2020-12-19 DIAGNOSIS — K219 Gastro-esophageal reflux disease without esophagitis: Secondary | ICD-10-CM | POA: Diagnosis not present

## 2020-12-19 DIAGNOSIS — R601 Generalized edema: Secondary | ICD-10-CM

## 2020-12-19 MED ORDER — OMEPRAZOLE 20 MG PO CPDR: 20.0000 mg | DELAYED_RELEASE_CAPSULE | Freq: Every day | ORAL | 1 refills | Status: DC

## 2020-12-19 MED ORDER — KETOROLAC TROMETHAMINE 60 MG/2ML IM SOLN
60.0000 mg | Freq: Once | INTRAMUSCULAR | Status: AC
  Administered 2020-12-19: 60 mg via INTRAMUSCULAR

## 2020-12-19 MED ORDER — ROSUVASTATIN CALCIUM 20 MG PO TABS: ORAL_TABLET | ORAL | 5 refills | Status: DC

## 2020-12-19 MED ORDER — LISINOPRIL-HYDROCHLOROTHIAZIDE 20-12.5 MG PO TABS
1.0000 | ORAL_TABLET | Freq: Every day | ORAL | 1 refills | Status: DC
Start: 2020-12-19 — End: 2021-03-05

## 2020-12-19 NOTE — Progress Notes (Signed)
Subjective:    Patient ID: Sarah Benton, female    DOB: Oct 20, 1945, 75 y.o.   MRN: [id number]   Chief Complaint: Medical Management of Chronic Issues    HPI:  1. Essential hypertension, benign No c/o chest pain, sob or headache. Does mot check blood pressure at home. BP Readings from Last 3 Encounters:  12/19/20 124/68  06/20/20 133/79  02/29/20 116/64     2. Mixed hyperlipidemia Does not watch diet and does very little exercise. Takes crestor 3x a week along with CQ10 which has helped with eg cramps. Still has cramps in toes sometimes. Lab Results  Component Value Date   CHOL 197 06/20/2020   HDL 50 06/20/2020   LDLCALC 120 (H) 06/20/2020   TRIG 154 (H) 06/20/2020   CHOLHDL 3.9 06/20/2020   The 10-year ASCVD risk score Mikey Bussing DC Jr., et al., 2013) is: 21.9%   3. Gastroesophageal reflux disease without esophagitis Is on omeprazole daily and works well when she takes it.  4. Generalized edema Has swelling in bil ankles daily. Resolves mostly at night.  5. Normocytic anemia No c/o fatigue Lab Results  Component Value Date   HGB 13.1 06/20/2020     6. Vitamin D deficiency Takes daily vitamin d supplement  7. sciatioc leg pain started bothering her again last week. Pain goes all the way doen her left leg. Heat has helped some.    8.  Morbid obesity ([place]) No recent weight changes Wt Readings from Last 3 Encounters:  12/19/20 212 lb (96.2 kg)  06/20/20 213 lb (96.6 kg)  02/29/20 211 lb (95.7 kg)   BMI Readings from Last 3 Encounters:  12/19/20 38.78 kg/m  06/20/20 38.96 kg/m  02/29/20 38.59 kg/m       Outpatient Encounter Medications as of 12/19/2020  Medication Sig  . Cholecalciferol (VITAMIN D3) 2000 UNITS capsule Take 2,000 Units by mouth daily.  Marland Kitchen lisinopril-hydrochlorothiazide (ZESTORETIC) 20-12.5 MG tablet Take 1 tablet by mouth daily.  . meloxicam (MOBIC) 15 MG tablet TAKE 1 TABLET DAILY  . nystatin ointment (MYCOSTATIN) APPLY TO THE  AFFECTEDoAREA(S) BY TOPICAL ROUTE 2 TIMES PER DAY  . omeprazole (PRILOSEC) 20 MG capsule Take 1 capsule (20 mg total) by mouth daily.  . rosuvastatin (CRESTOR) 20 MG tablet 1 po on m on,wed and fri     Past Surgical History:  Procedure Laterality Date  . ABDOMINAL HYSTERECTOMY Bilateral 2001   complete  . BREAST BIOPSY  1993   Left/negative  . BREAST CYST EXCISION Left   . LUMBAR LAMINECTOMY  1998   ruptured disc  . TONSILLECTOMY    . TUBAL LIGATION  1979    Family History  Problem Relation Age of Onset  . Parkinsonism Mother 55  . Ovarian cancer Mother   . Hypertension Father   . Prostate cancer Father 61       met to bone / hip  . Arthritis Sister        knee  . Diabetes Daughter   . Diabetes Son        type 2  . Diabetes Grandchild 17       type 1  . Colon cancer Neg Hx   . Breast cancer Neg Hx     New complaints: No ne other then stated above  Social history: Lives by herself  Controlled substance contract: n/a    Review of Systems  Constitutional: Negative for diaphoresis.  Eyes: Negative for pain.  Respiratory: Negative for shortness of breath.  Cardiovascular: Negative for chest pain, palpitations and leg swelling.  Gastrointestinal: Negative for abdominal pain.  Endocrine: Negative for polydipsia.  Skin: Negative for rash.  Neurological: Negative for dizziness, weakness and headaches.  Hematological: Does not bruise/bleed easily.  All other systems reviewed and are negative.      Objective:   Physical Exam Vitals and nursing note reviewed.  Constitutional:      General: She is not in acute distress.    Appearance: Normal appearance. She is well-developed.  HENT:     Head: Normocephalic.     Nose: Nose normal.  Eyes:     Pupils: Pupils are equal, round, and reactive to light.  Neck:     Vascular: No carotid bruit or JVD.  Cardiovascular:     Rate and Rhythm: Normal rate and regular rhythm.     Heart sounds: Normal heart sounds.   Pulmonary:     Effort: Pulmonary effort is normal. No respiratory distress.     Breath sounds: Normal breath sounds. No wheezing or rales.  Chest:     Chest wall: No tenderness.  Abdominal:     General: Bowel sounds are normal. There is no distension or abdominal bruit.     Palpations: Abdomen is soft. There is no hepatomegaly, splenomegaly, mass or pulsatile mass.     Tenderness: There is no abdominal tenderness.  Musculoskeletal:        General: Normal range of motion.     Cervical back: Normal range of motion and neck supple.  Lymphadenopathy:     Cervical: No cervical adenopathy.  Skin:    General: Skin is warm and dry.  Neurological:     Mental Status: She is alert and oriented to person, place, and time.     Deep Tendon Reflexes: Reflexes are normal and symmetric.  Psychiatric:        Behavior: Behavior normal.        Thought Content: Thought content normal.        Judgment: Judgment normal.     BP 124/68   Pulse 64   Temp 98.1 F (36.7 C) (Temporal)   Resp 20   Ht 5\' 2"  (1.575 m)   Wt 212 lb (96.2 kg)   SpO2 96%   BMI 38.78 kg/m   EKG- NSR-[name], FNP       Assessment & Plan:  Sarah Benton comes in today with chief complaint of Medical Management of Chronic Issues   Diagnosis and orders addressed:  1. Essential hypertension, benign Low sodium diet - lisinopril-hydrochlorothiazide (ZESTORETIC) 20-12.5 MG tablet; Take 1 tablet by mouth daily.  Dispense: 90 tablet; Refill: 1 - CBC with Differential/Platelet - CMP14+EGFR - DG Chest 2 View - EKG 12-Lead  2. Mixed hyperlipidemia Low fta diet - rosuvastatin (CRESTOR) 20 MG tablet; 1 po on m on,wed and fri  Dispense: 40 tablet; Refill: 5 - Lipid panel  3. Gastroesophageal reflux disease without esophagitis Avoid spicy foods Do not eat 2 hours prior to bedtime - omeprazole (PRILOSEC) 20 MG capsule; Take 1 capsule (20 mg total) by mouth daily.  Dispense: 90 capsule; Refill: 1  4.  Generalized edema Elevate legs when sitting  5. Normocytic anemia Labs pending  6. Vitamin D deficiency Continue daly vitamin d supplement  7. Morbid obesity ([place]) Discussed diet and exercise for person with BMI >25 Will recheck weight in 3-6 months   8. Sciatic leg pain Continue moist heat - ketorolac (TORADOL) injection 60 mg   Labs pending Health  Maintenance reviewed Diet and exercise encouraged  Follow up plan: 6 months   [name], FNP

## 2020-12-20 LAB — CMP14+EGFR
ALT: 34 IU/L — ABNORMAL HIGH (ref 0–32)
AST: 27 IU/L (ref 0–40)
Albumin/Globulin Ratio: 1.9 (ref 1.2–2.2)
Albumin: 4.4 g/dL (ref 3.7–4.7)
Alkaline Phosphatase: 134 IU/L — ABNORMAL HIGH (ref 44–121)
BUN/Creatinine Ratio: 20 (ref 12–28)
BUN: 22 mg/dL (ref 8–27)
Bilirubin Total: 0.4 mg/dL (ref 0.0–1.2)
CO2: 25 mmol/L (ref 20–29)
Calcium: 9.4 mg/dL (ref 8.7–10.3)
Chloride: 101 mmol/L (ref 96–106)
Creatinine, Ser: 1.08 mg/dL — ABNORMAL HIGH (ref 0.57–1.00)
Globulin, Total: 2.3 g/dL (ref 1.5–4.5)
Glucose: 99 mg/dL (ref 65–99)
Potassium: 4.7 mmol/L (ref 3.5–5.2)
Sodium: 142 mmol/L (ref 134–144)
Total Protein: 6.7 g/dL (ref 6.0–8.5)
eGFR: 53 mL/min/{1.73_m2} — ABNORMAL LOW (ref >59)

## 2020-12-20 LAB — CBC WITH DIFFERENTIAL/PLATELET
Basophils Absolute: 0.1 10*3/uL (ref 0.0–0.2)
Basos: 1 %
EOS (ABSOLUTE): 0.2 10*3/uL (ref 0.0–0.4)
Eos: 2 %
Hematocrit: 45.5 % (ref 34.0–46.6)
Hemoglobin: 14.6 g/dL (ref 11.1–15.9)
Immature Grans (Abs): 0 10*3/uL (ref 0.0–0.1)
Immature Granulocytes: 0 %
Lymphocytes Absolute: 2.3 10*3/uL (ref 0.7–3.1)
Lymphs: 27 %
MCH: 27.3 pg (ref 26.6–33.0)
MCHC: 32.1 g/dL (ref 31.5–35.7)
MCV: 85 fL (ref 79–97)
Monocytes Absolute: 0.7 10*3/uL (ref 0.1–0.9)
Monocytes: 8 %
Neutrophils Absolute: 5.5 10*3/uL (ref 1.4–7.0)
Neutrophils: 62 %
Platelets: 296 10*3/uL (ref 150–450)
RBC: 5.34 x10E6/uL — ABNORMAL HIGH (ref 3.77–5.28)
RDW: 12.5 % (ref 11.7–15.4)
WBC: 8.7 10*3/uL (ref 3.4–10.8)

## 2020-12-20 LAB — LIPID PANEL
Chol/HDL Ratio: 3.6 ratio (ref 0.0–4.4)
Cholesterol, Total: 204 mg/dL — ABNORMAL HIGH (ref 100–199)
HDL: 56 mg/dL (ref >39)
LDL Chol Calc (NIH): 121 mg/dL — ABNORMAL HIGH (ref 0–99)
Triglycerides: 154 mg/dL — ABNORMAL HIGH (ref 0–149)
VLDL Cholesterol Cal: 27 mg/dL (ref 5–40)

## 2021-01-16 DIAGNOSIS — H60391 Other infective otitis externa, right ear: Secondary | ICD-10-CM | POA: Diagnosis not present

## 2021-01-16 DIAGNOSIS — J0100 Acute maxillary sinusitis, unspecified: Secondary | ICD-10-CM | POA: Diagnosis not present

## 2021-03-05 ENCOUNTER — Other Ambulatory Visit: Payer: Self-pay | Admitting: Nurse Practitioner

## 2021-03-05 DIAGNOSIS — K219 Gastro-esophageal reflux disease without esophagitis: Secondary | ICD-10-CM

## 2021-03-05 DIAGNOSIS — I10 Essential (primary) hypertension: Secondary | ICD-10-CM

## 2021-04-17 DIAGNOSIS — H10013 Acute follicular conjunctivitis, bilateral: Secondary | ICD-10-CM | POA: Diagnosis not present

## 2021-04-18 DIAGNOSIS — Z6837 Body mass index (BMI) 37.0-37.9, adult: Secondary | ICD-10-CM | POA: Diagnosis not present

## 2021-04-18 DIAGNOSIS — Z01419 Encounter for gynecological examination (general) (routine) without abnormal findings: Secondary | ICD-10-CM | POA: Diagnosis not present

## 2021-05-29 ENCOUNTER — Other Ambulatory Visit: Payer: Self-pay | Admitting: Nurse Practitioner

## 2021-05-29 DIAGNOSIS — K219 Gastro-esophageal reflux disease without esophagitis: Secondary | ICD-10-CM

## 2021-05-29 DIAGNOSIS — I10 Essential (primary) hypertension: Secondary | ICD-10-CM

## 2021-06-19 ENCOUNTER — Encounter: Payer: Self-pay | Admitting: Nurse Practitioner

## 2021-06-19 ENCOUNTER — Other Ambulatory Visit: Payer: Self-pay

## 2021-06-19 ENCOUNTER — Ambulatory Visit: Payer: Medicare PPO | Admitting: Nurse Practitioner

## 2021-06-19 VITALS — BP 118/62 | HR 65 | Temp 98.3°F | Resp 20 | Ht 62.0 in | Wt 215.0 lb

## 2021-06-19 DIAGNOSIS — Z23 Encounter for immunization: Secondary | ICD-10-CM | POA: Diagnosis not present

## 2021-06-19 DIAGNOSIS — D649 Anemia, unspecified: Secondary | ICD-10-CM | POA: Diagnosis not present

## 2021-06-19 DIAGNOSIS — I10 Essential (primary) hypertension: Secondary | ICD-10-CM | POA: Diagnosis not present

## 2021-06-19 DIAGNOSIS — E782 Mixed hyperlipidemia: Secondary | ICD-10-CM | POA: Diagnosis not present

## 2021-06-19 DIAGNOSIS — K219 Gastro-esophageal reflux disease without esophagitis: Secondary | ICD-10-CM | POA: Diagnosis not present

## 2021-06-19 DIAGNOSIS — R601 Generalized edema: Secondary | ICD-10-CM

## 2021-06-19 DIAGNOSIS — M5441 Lumbago with sciatica, right side: Secondary | ICD-10-CM | POA: Diagnosis not present

## 2021-06-19 DIAGNOSIS — E559 Vitamin D deficiency, unspecified: Secondary | ICD-10-CM

## 2021-06-19 LAB — CMP14+EGFR
ALT: 24 IU/L (ref 0–32)
AST: 21 IU/L (ref 0–40)
Albumin/Globulin Ratio: 2 (ref 1.2–2.2)
Albumin: 4.3 g/dL (ref 3.7–4.7)
Alkaline Phosphatase: 135 IU/L — ABNORMAL HIGH (ref 44–121)
BUN/Creatinine Ratio: 22 (ref 12–28)
BUN: 24 mg/dL (ref 8–27)
Bilirubin Total: 0.3 mg/dL (ref 0.0–1.2)
CO2: 24 mmol/L (ref 20–29)
Calcium: 9.8 mg/dL (ref 8.7–10.3)
Chloride: 102 mmol/L (ref 96–106)
Creatinine, Ser: 1.07 mg/dL — ABNORMAL HIGH (ref 0.57–1.00)
Globulin, Total: 2.2 g/dL (ref 1.5–4.5)
Glucose: 115 mg/dL — ABNORMAL HIGH (ref 70–99)
Potassium: 5 mmol/L (ref 3.5–5.2)
Sodium: 140 mmol/L (ref 134–144)
Total Protein: 6.5 g/dL (ref 6.0–8.5)
eGFR: 53 mL/min/{1.73_m2} — ABNORMAL LOW (ref >59)

## 2021-06-19 LAB — CBC WITH DIFFERENTIAL/PLATELET
Basophils Absolute: 0.1 10*3/uL (ref 0.0–0.2)
Basos: 1 %
EOS (ABSOLUTE): 0.2 10*3/uL (ref 0.0–0.4)
Eos: 2 %
Hematocrit: 40.3 % (ref 34.0–46.6)
Hemoglobin: 13.6 g/dL (ref 11.1–15.9)
Immature Grans (Abs): 0 10*3/uL (ref 0.0–0.1)
Immature Granulocytes: 0 %
Lymphocytes Absolute: 2 10*3/uL (ref 0.7–3.1)
Lymphs: 21 %
MCH: 28.3 pg (ref 26.6–33.0)
MCHC: 33.7 g/dL (ref 31.5–35.7)
MCV: 84 fL (ref 79–97)
Monocytes Absolute: 0.6 10*3/uL (ref 0.1–0.9)
Monocytes: 6 %
Neutrophils Absolute: 6.8 10*3/uL (ref 1.4–7.0)
Neutrophils: 70 %
Platelets: 262 10*3/uL (ref 150–450)
RBC: 4.81 x10E6/uL (ref 3.77–5.28)
RDW: 12.3 % (ref 11.7–15.4)
WBC: 9.7 10*3/uL (ref 3.4–10.8)

## 2021-06-19 LAB — LIPID PANEL
Chol/HDL Ratio: 3.2 ratio (ref 0.0–4.4)
Cholesterol, Total: 171 mg/dL (ref 100–199)
HDL: 54 mg/dL (ref >39)
LDL Chol Calc (NIH): 91 mg/dL (ref 0–99)
Triglycerides: 147 mg/dL (ref 0–149)
VLDL Cholesterol Cal: 26 mg/dL (ref 5–40)

## 2021-06-19 MED ORDER — OMEPRAZOLE 20 MG PO CPDR: 20.0000 mg | DELAYED_RELEASE_CAPSULE | Freq: Every day | ORAL | 1 refills | Status: DC

## 2021-06-19 MED ORDER — PREDNISONE 20 MG PO TABS: ORAL_TABLET | ORAL | 0 refills | Status: DC

## 2021-06-19 MED ORDER — METHYLPREDNISOLONE ACETATE 80 MG/ML IJ SUSP
80.0000 mg | Freq: Once | INTRAMUSCULAR | Status: AC
Start: 2021-06-19 — End: 2021-06-19
  Administered 2021-06-19: 80 mg via INTRAMUSCULAR

## 2021-06-19 MED ORDER — LISINOPRIL-HYDROCHLOROTHIAZIDE 20-12.5 MG PO TABS: 1.0000 | ORAL_TABLET | Freq: Every day | ORAL | 1 refills | Status: DC

## 2021-06-19 MED ORDER — ROSUVASTATIN CALCIUM 20 MG PO TABS: 20.0000 mg | ORAL_TABLET | Freq: Every day | ORAL | 1 refills | Status: DC

## 2021-06-19 NOTE — Progress Notes (Signed)
Subjective:    Patient ID: Sarah Benton, female    DOB: 09/15/1946, 75 y.o.   MRN: [id number]   Chief Complaint: Medical Management of Chronic Issues    HPI:  1. Essential hypertension, benign No c/o chest pain, sob or headache. Does not check blood pressure at home. BP Readings from Last 3 Encounters:  06/19/21 118/62  12/19/20 124/68  06/20/20 133/79      2. Mixed hyperlipidemia Does try to watch diet but does little to no dedicated exercise. Statins cause pan. She is on Cq10. She is still on her crestor but causes leg cramps. Lab Results  Component Value Date   CHOL 204 (H) 12/19/2020   HDL 56 12/19/2020   LDLCALC 121 (H) 12/19/2020   TRIG 154 (H) 12/19/2020   CHOLHDL 3.6 12/19/2020     3. Gastroesophageal reflux disease without esophagitis Is on omprazole daily and that works well to keep symptoms under control  4. Normocytic anemia No c/o fatigue Lab Results  Component Value Date   HGB 14.6 12/19/2020     5. Generalized edema Has edema daily by the end os the day. Resolves some at night.  6. Vitamin D deficiency Is on daily vitamin d supplement.  7. Morbid obesity ([place]) No recent weight changes Wt Readings from Last 3 Encounters:  06/19/21 215 lb (97.5 kg)  12/19/20 212 lb (96.2 kg)  06/20/20 213 lb (96.6 kg)   BMI Readings from Last 3 Encounters:  06/19/21 39.32 kg/m  12/19/20 38.78 kg/m  06/20/20 38.96 kg/m       Outpatient Encounter Medications as of 06/19/2021  Medication Sig   Cholecalciferol (VITAMIN D3) 2000 UNITS capsule Take 2,000 Units by mouth daily.   Coenzyme Q10 (CO Q-10 PO) Take by mouth.   lisinopril-hydrochlorothiazide (ZESTORETIC) 20-12.5 MG tablet TAKE 1 TABLET DAILY   meloxicam (MOBIC) 15 MG tablet TAKE 1 TABLET DAILY   nystatin ointment (MYCOSTATIN) APPLY TO THE AFFECTEDoAREA(S) BY TOPICAL ROUTE 2 TIMES PER DAY   omeprazole (PRILOSEC) 20 MG capsule TAKE (1) CAPSULE DAILY   [DISCONTINUED] rosuvastatin (CRESTOR)  20 MG tablet 1 po on m on,wed and fri (Patient taking differently: Take 20 mg by mouth daily.)   No facility-administered encounter medications on file as of 06/19/2021.    Past Surgical History:  Procedure Laterality Date   ABDOMINAL HYSTERECTOMY Bilateral 2001   complete   BREAST BIOPSY  1993   Left/negative   BREAST CYST EXCISION Left    LUMBAR LAMINECTOMY  1998   ruptured disc   TONSILLECTOMY     TUBAL LIGATION  1979    Family History  Problem Relation Age of Onset   Parkinsonism Mother 63   Ovarian cancer Mother    Hypertension Father    Prostate cancer Father 54       met to bone / hip   Arthritis Sister        knee   Diabetes Daughter    Diabetes Son        type 2   Diabetes Grandchild 17       type 1   Colon cancer Neg Hx    Breast cancer Neg Hx     New complaints: Having low back pain on right. Has DDD and it has been bothering. Aleve usually helps but has not been helping. Rates pain 7/10 today. Standing increases pain. Pain with radiate to right hip and down leg at times.  Social history: Lives with her husband  Controlled substance  contract: n/a     Review of Systems  Constitutional:  Negative for diaphoresis.  Eyes:  Negative for pain.  Respiratory:  Negative for shortness of breath.   Cardiovascular:  Negative for chest pain, palpitations and leg swelling.  Gastrointestinal:  Negative for abdominal pain.  Endocrine: Negative for polydipsia.  Skin:  Negative for rash.  Neurological:  Negative for dizziness, weakness and headaches.  Hematological:  Does not bruise/bleed easily.  All other systems reviewed and are negative.     Objective:   Physical Exam Vitals and nursing note reviewed.  Constitutional:      General: She is not in acute distress.    Appearance: Normal appearance. She is well-developed.  HENT:     Head: Normocephalic.     Right Ear: Tympanic membrane normal.     Left Ear: Tympanic membrane normal.     Nose: Nose normal.      Mouth/Throat:     Mouth: Mucous membranes are moist.  Eyes:     Pupils: Pupils are equal, round, and reactive to light.  Neck:     Vascular: No carotid bruit or JVD.  Cardiovascular:     Rate and Rhythm: Normal rate and regular rhythm.     Heart sounds: Normal heart sounds.  Pulmonary:     Effort: Pulmonary effort is normal. No respiratory distress.     Breath sounds: Normal breath sounds. No wheezing or rales.  Chest:     Chest wall: No tenderness.  Abdominal:     General: Bowel sounds are normal. There is no distension or abdominal bruit.     Palpations: Abdomen is soft. There is no hepatomegaly, splenomegaly, mass or pulsatile mass.     Tenderness: There is no abdominal tenderness.  Musculoskeletal:        General: Normal range of motion.     Cervical back: Normal range of motion and neck supple.     Comments: Rises slowly from sitting to standing FROMof lower back without pain. (-) SLR bil Motor strength and sensation distally intact  Lymphadenopathy:     Cervical: No cervical adenopathy.  Skin:    General: Skin is warm and dry.  Neurological:     Mental Status: She is alert and oriented to person, place, and time.     Deep Tendon Reflexes: Reflexes are normal and symmetric.  Psychiatric:        Behavior: Behavior normal.        Thought Content: Thought content normal.        Judgment: Judgment normal.    BP 118/62   Pulse 65   Temp 98.3 F (36.8 C) (Temporal)   Resp 20   Ht '5\' 2"'  (1.575 m)   Wt 215 lb (97.5 kg)   SpO2 98%   BMI 39.32 kg/m        Assessment & Plan:   Sarah Benton comes in today with chief complaint of Medical Management of Chronic Issues   Diagnosis and orders addressed:  1. Essential hypertension, benign Low salt det - lisinopril-hydrochlorothiazide (ZESTORETIC) 20-12.5 MG tablet; Take 1 tablet by mouth daily.  Dispense: 90 tablet; Refill: 1 - CBC with Differential/Platelet - CMP14+EGFR  2. Mixed hyperlipidemia Low fta  diet - rosuvastatin (CRESTOR) 20 MG tablet; Take 1 tablet (20 mg total) by mouth daily.  Dispense: 90 tablet; Refill: 1 - Lipid panel  3. Gastroesophageal reflux disease without esophagitis Avoid spicy foods Do not eat 2 hours prior to bedtime - omeprazole (PRILOSEC) 20  MG capsule; Take 1 capsule (20 mg total) by mouth daily.  Dispense: 90 capsule; Refill: 1  4. Normocytic anemia Labs oending  5. Generalized edema Elevate legs when sitting  6. Vitamin D deficiency Continue daily vitamin d su[pplement  7. Morbid obesity ([place]) Discussed diet and exercise for person with BMI >25 Will recheck weight in 3-6 months   8. Acute right-sided low back pain with right-sided sciatica Moist heat rest - methylPREDNISolone acetate (DEPO-MEDROL) injection 80 mg - predniSONE (DELTASONE) 20 MG tablet; 2 po at sametime daily for 5 days-  Dispense: 10 tablet; Refill: 0   Labs pending Health Maintenance reviewed Diet and exercise encouraged  Follow up plan: 6 months   [name], FNP

## 2021-06-19 NOTE — Patient Instructions (Signed)

## 2021-06-21 ENCOUNTER — Ambulatory Visit: Payer: Self-pay | Admitting: Nurse Practitioner

## 2021-07-09 ENCOUNTER — Telehealth: Payer: Self-pay | Admitting: Nurse Practitioner

## 2021-07-09 NOTE — Telephone Encounter (Signed)
Left message for patient to call back and schedule Medicare Annual Wellness Visit    Last AWVs 06/02/18 please schedule at anytime with health coach  This should be a 45 minute visit.

## 2021-07-16 ENCOUNTER — Ambulatory Visit (INDEPENDENT_AMBULATORY_CARE_PROVIDER_SITE_OTHER): Payer: Medicare PPO

## 2021-07-16 VITALS — Ht 62.0 in | Wt 212.0 lb

## 2021-07-16 DIAGNOSIS — Z0000 Encounter for general adult medical examination without abnormal findings: Secondary | ICD-10-CM

## 2021-07-16 NOTE — Patient Instructions (Signed)
Sarah Benton , Thank you for taking time to come for your Medicare Wellness Visit. I appreciate your ongoing commitment to your health goals. Please review the following plan we discussed and let me know if I can assist you in the future.   Screening recommendations/referrals: Colonoscopy: Done 10/25/2015 - Repeat in 5 years Mammogram: Done 10/24/2020 - Repeat annually Bone Density: Done 06/20/2020 - Repeat in 5 years Recommended yearly ophthalmology/optometry visit for glaucoma screening and checkup Recommended yearly dental visit for hygiene and checkup  Vaccinations: Influenza vaccine: Done 06/19/2021 - Repeat annually Pneumococcal vaccine: Done 06/07/2011 & 07/25/2014 Tdap vaccine: Done 06/07/2011 - Repeat in 10 years *due  Shingles vaccine: Zostavax done 2016 - due for Shingrix 1 & 2   Covid-19: Done 10/13/19, 11/10/19, 07/17/20, 01/30/21, & 07/10/21  Advanced directives: Please bring a copy of your health care power of attorney and living will to the office to be added to your chart at your convenience.   Conditions/risks identified: Aim for 30 minutes of exercise or brisk walking each day, drink 6-8 glasses of water and eat lots of fruits and vegetables.   Next appointment: Follow up in one year for your annual wellness visit    Preventive Care 65 Years and Older, Female Preventive care refers to lifestyle choices and visits with your health care provider that can promote health and wellness. What does preventive care include? A yearly physical exam. This is also called an annual well check. Dental exams once or twice a year. Routine eye exams. Ask your health care provider how often you should have your eyes checked. Personal lifestyle choices, including: Daily care of your teeth and gums. Regular physical activity. Eating a healthy diet. Avoiding tobacco and drug use. Limiting alcohol use. Practicing safe sex. Taking low-dose aspirin every day. Taking vitamin and mineral supplements  as recommended by your health care provider. What happens during an annual well check? The services and screenings done by your health care provider during your annual well check will depend on your age, overall health, lifestyle risk factors, and family history of disease. Counseling  Your health care provider may ask you questions about your: Alcohol use. Tobacco use. Drug use. Emotional well-being. Home and relationship well-being. Sexual activity. Eating habits. History of falls. Memory and ability to understand (cognition). Work and work Statistician. Reproductive health. Screening  You may have the following tests or measurements: Height, weight, and BMI. Blood pressure. Lipid and cholesterol levels. These may be checked every 5 years, or more frequently if you are over 39 years old. Skin check. Lung cancer screening. You may have this screening every year starting at age 77 if you have a 30-pack-year history of smoking and currently smoke or have quit within the past 15 years. Fecal occult blood test (FOBT) of the stool. You may have this test every year starting at age 77. Flexible sigmoidoscopy or colonoscopy. You may have a sigmoidoscopy every 5 years or a colonoscopy every 10 years starting at age 77. Hepatitis C blood test. Hepatitis B blood test. Sexually transmitted disease (STD) testing. Diabetes screening. This is done by checking your blood sugar (glucose) after you have not eaten for a while (fasting). You may have this done every 1-3 years. Bone density scan. This is done to screen for osteoporosis. You may have this done starting at age 77. Mammogram. This may be done every 1-2 years. Talk to your health care provider about how often you should have regular mammograms. Talk with your health  care provider about your test results, treatment options, and if necessary, the need for more tests. Vaccines  Your health care provider may recommend certain vaccines, such  as: Influenza vaccine. This is recommended every year. Tetanus, diphtheria, and acellular pertussis (Tdap, Td) vaccine. You may need a Td booster every 10 years. Zoster vaccine. You may need this after age 77. Pneumococcal 13-valent conjugate (PCV13) vaccine. One dose is recommended after age 77. Pneumococcal polysaccharide (PPSV23) vaccine. One dose is recommended after age 77. Talk to your health care provider about which screenings and vaccines you need and how often you need them. This information is not intended to replace advice given to you by your health care provider. Make sure you discuss any questions you have with your health care provider. Document Released: 10/06/2015 Document Revised: 05/29/2016 Document Reviewed: 07/11/2015 Elsevier Interactive Patient Education  2017 [place] Prevention in the Home Falls can cause injuries. They can happen to people of all ages. There are many things you can do to make your home safe and to help prevent falls. What can I do on the outside of my home? Regularly fix the edges of walkways and driveways and fix any cracks. Remove anything that might make you trip as you walk through a door, such as a raised step or threshold. Trim any bushes or trees on the path to your home. Use bright outdoor lighting. Clear any walking paths of anything that might make someone trip, such as rocks or tools. Regularly check to see if handrails are loose or broken. Make sure that both sides of any steps have handrails. Any raised decks and porches should have guardrails on the edges. Have any leaves, snow, or ice cleared regularly. Use sand or salt on walking paths during winter. Clean up any spills in your garage right away. This includes oil or grease spills. What can I do in the bathroom? Use night lights. Install grab bars by the toilet and in the tub and shower. Do not use towel bars as grab bars. Use non-skid mats or decals in the tub or  shower. If you need to sit down in the shower, use a plastic, non-slip stool. Keep the floor dry. Clean up any water that spills on the floor as soon as it happens. Remove soap buildup in the tub or shower regularly. Attach bath mats securely with double-sided non-slip rug tape. Do not have throw rugs and other things on the floor that can make you trip. What can I do in the bedroom? Use night lights. Make sure that you have a light by your bed that is easy to reach. Do not use any sheets or blankets that are too big for your bed. They should not hang down onto the floor. Have a firm chair that has side arms. You can use this for support while you get dressed. Do not have throw rugs and other things on the floor that can make you trip. What can I do in the kitchen? Clean up any spills right away. Avoid walking on wet floors. Keep items that you use a lot in easy-to-reach places. If you need to reach something above you, use a strong step stool that has a grab bar. Keep electrical cords out of the way. Do not use floor polish or wax that makes floors slippery. If you must use wax, use non-skid floor wax. Do not have throw rugs and other things on the floor that can make you trip. What can  I do with my stairs? Do not leave any items on the stairs. Make sure that there are handrails on both sides of the stairs and use them. Fix handrails that are broken or loose. Make sure that handrails are as long as the stairways. Check any carpeting to make sure that it is firmly attached to the stairs. Fix any carpet that is loose or worn. Avoid having throw rugs at the top or bottom of the stairs. If you do have throw rugs, attach them to the floor with carpet tape. Make sure that you have a light switch at the top of the stairs and the bottom of the stairs. If you do not have them, ask someone to add them for you. What else can I do to help prevent falls? Wear shoes that: Do not have high heels. Have  rubber bottoms. Are comfortable and fit you well. Are closed at the toe. Do not wear sandals. If you use a stepladder: Make sure that it is fully opened. Do not climb a closed stepladder. Make sure that both sides of the stepladder are locked into place. Ask someone to hold it for you, if possible. Clearly mark and make sure that you can see: Any grab bars or handrails. First and last steps. Where the edge of each step is. Use tools that help you move around (mobility aids) if they are needed. These include: Canes. Walkers. Scooters. Crutches. Turn on the lights when you go into a dark area. Replace any light bulbs as soon as they burn out. Set up your furniture so you have a clear path. Avoid moving your furniture around. If any of your floors are uneven, fix them. If there are any pets around you, be aware of where they are. Review your medicines with your doctor. Some medicines can make you feel dizzy. This can increase your chance of falling. Ask your doctor what other things that you can do to help prevent falls. This information is not intended to replace advice given to you by your health care provider. Make sure you discuss any questions you have with your health care provider. Document Released: 07/06/2009 Document Revised: 02/15/2016 Document Reviewed: 10/14/2014 Elsevier Interactive Patient Education  2017 [employer].

## 2021-07-16 NOTE — Progress Notes (Signed)
 Subjective:   Sarah Benton is a 75 y.o. female who presents for Medicare Annual (Subsequent) preventive examination.  Virtual Visit via Telephone Note  I connected with  Maliaka S Kaminsky on 07/16/21 at 11:15 AM EDT by telephone and verified that I am speaking with the correct person using two identifiers.  Location: Patient: Home Provider: [facility] Persons participating in the virtual visit: patient/Nurse Health Advisor   I discussed the limitations, risks, security and privacy concerns of performing an evaluation and management service by telephone and the availability of in person appointments. The patient expressed understanding and agreed to proceed.  Interactive audio and video telecommunications were attempted between this nurse and patient, however failed, due to patient having technical difficulties OR patient did not have access to video capability.  We continued and completed visit with audio only.  Some vital signs may be absent or patient reported.   Amy E Hopkins, LPN   Review of Systems     Cardiac Risk Factors include: advanced age (>55men, >65 women);dyslipidemia;hypertension;sedentary lifestyle;obesity (BMI >30kg/m2)     Objective:    Today's Vitals   07/16/21 1107 07/16/21 1108  Weight: 212 lb (96.2 kg)   Height: 5' 2" (1.575 m)   PainSc:  2    Body mass index is 38.78 kg/m.  Advanced Directives 07/16/2021 06/02/2018 05/21/2016 01/10/2015 07/25/2014 06/13/2013  Does Patient Have a Medical Advance Directive? Yes Yes Yes Yes Yes Patient does not have advance directive  Type of Advance Directive Healthcare Power of Attorney;Living will Healthcare Power of Attorney;Living will Healthcare Power of Attorney;Living will Living will Living will -  Does patient want to make changes to medical advance directive? - No - Patient declined - - - -  Copy of Healthcare Power of Attorney in Chart? No - copy requested No - copy requested No - copy requested No - copy  requested - -  Pre-existing out of facility DNR order (yellow form or pink MOST form) - - - - - No    Current Medications (verified) Outpatient Encounter Medications as of 07/16/2021  Medication Sig   Cholecalciferol (VITAMIN D3) 2000 UNITS capsule Take 2,000 Units by mouth daily.   Coenzyme Q10 (CO Q-10 PO) Take by mouth.   ketoconazole 2%-triamcinolone 0.1% 1:2 cream mixture Apply topically.   lisinopril-hydrochlorothiazide (ZESTORETIC) 20-12.5 MG tablet Take 1 tablet by mouth daily.   meloxicam (MOBIC) 15 MG tablet TAKE 1 TABLET DAILY   omeprazole (PRILOSEC) 20 MG capsule Take 1 capsule (20 mg total) by mouth daily.   rosuvastatin (CRESTOR) 20 MG tablet Take 1 tablet (20 mg total) by mouth daily.   [DISCONTINUED] nystatin cream (MYCOSTATIN) nystatin 100,000 unit/gram topical cream   [DISCONTINUED] nystatin ointment (MYCOSTATIN) APPLY TO THE AFFECTEDoAREA(S) BY TOPICAL ROUTE 2 TIMES PER DAY   predniSONE (DELTASONE) 20 MG tablet 2 po at sametime daily for 5 days- (Patient not taking: Reported on 07/16/2021)   No facility-administered encounter medications on file as of 07/16/2021.    Allergies (verified) Amoxicillin, Crestor [rosuvastatin calcium], Septra [sulfamethoxazole-trimethoprim], and Atorvastatin   History: Past Medical History:  Diagnosis Date   Anemia    Iron def   Arthritis    Cataract    Edema    Esophageal stricture    GERD (gastroesophageal reflux disease)    Hyperlipidemia    Hypertension    Obesity    Ruptured lumbar disc    Past Surgical History:  Procedure Laterality Date   ABDOMINAL HYSTERECTOMY Bilateral 2001   complete     BREAST BIOPSY  1993   Left/negative   BREAST CYST EXCISION Left    LUMBAR LAMINECTOMY  1998   ruptured disc   TONSILLECTOMY     TUBAL LIGATION  1979   Family History  Problem Relation Age of Onset   Parkinsonism Mother 79   Ovarian cancer Mother    Hypertension Father    Prostate cancer Father 91       met to bone / hip    Arthritis Sister        knee   Diabetes Daughter    Diabetes Son        type 2   Diabetes Grandchild 17       type 1   Colon cancer Neg Hx    Breast cancer Neg Hx    Social History   Socioeconomic History   Marital status: Widowed    Spouse name: Not on file   Number of children: 2   Years of education: 12   Highest education level: 12th grade  Occupational History   Occupation: Retired    Comment: School system  Tobacco Use   Smoking status: Never   Smokeless tobacco: Never  Vaping Use   Vaping Use: Never used  Substance and Sexual Activity   Alcohol use: No    Alcohol/week: 0.0 standard drinks   Drug use: No   Sexual activity: Not Currently  Other Topics Concern   Not on file  Social History Narrative   Not on file   Social Determinants of Health   Financial Resource Strain: Low Risk    Difficulty of Paying Living Expenses: Not hard at all  Food Insecurity: No Food Insecurity   Worried About Running Out of Food in the Last Year: Never true   Ran Out of Food in the Last Year: Never true  Transportation Needs: No Transportation Needs   Lack of Transportation (Medical): No   Lack of Transportation (Non-Medical): No  Physical Activity: Inactive   Days of Exercise per Week: 0 days   Minutes of Exercise per Session: 0 min  Stress: No Stress Concern Present   Feeling of Stress : Not at all  Social Connections: Moderately Isolated   Frequency of Communication with Friends and Family: More than three times a week   Frequency of Social Gatherings with Friends and Family: More than three times a week   Attends Religious Services: Never   Active Member of Clubs or Organizations: Yes   Attends Club or Organization Meetings: More than 4 times per year   Marital Status: Widowed    Tobacco Counseling Counseling given: Not Answered   Clinical Intake:  Pre-visit preparation completed: Yes  Pain : 0-10 Pain Score: 2  Pain Type: Chronic pain Pain Location:  Back Pain Descriptors / Indicators: Aching, Discomfort Pain Onset: More than a month ago Pain Frequency: Intermittent     BMI - recorded: 38.78 Nutritional Status: BMI > 30  Obese Nutritional Risks: None Diabetes: No  How often do you need to have someone help you when you read instructions, pamphlets, or other written materials from your doctor or pharmacy?: 1 - Never  Diabetic? No - borderline  Interpreter Needed?: No  Information entered by :: Amy Hopkins, LPN   Activities of Daily Living In your present state of health, do you have any difficulty performing the following activities: 07/16/2021  Hearing? N  Vision? N  Difficulty concentrating or making decisions? N  Walking or climbing stairs? Y  Comment only when   back hurts  Dressing or bathing? N  Doing errands, shopping? N  Preparing Food and eating ? N  Using the Toilet? N  In the past six months, have you accidently leaked urine? Y  Comment only at night if she waits too long  Do you have problems with loss of bowel control? N  Managing your Medications? N  Managing your Finances? N  Housekeeping or managing your Housekeeping? N  Some recent data might be hidden    Patient Care Team: Martin, Mary-Margaret, FNP as PCP - General (Nurse Practitioner) Henley, Thomas, MD as Consulting Physician (Obstetrics and Gynecology) Stark, Malcolm T, MD as Consulting Physician (Gastroenterology) Drake, Cody, DPM as Consulting Physician (Podiatry)  Indicate any recent Medical Services you may have received from other than [facility] providers in the past year (date may be approximate).     Assessment:   This is a routine wellness examination for Jadalynn.  Hearing/Vision screen Hearing Screening - Comments:: Denies hearing difficulties  Vision Screening - Comments:: Wears rx glasses - up to date with annual eye exams at [facility].   Dietary issues and exercise activities discussed: Current Exercise Habits:  The patient does not participate in regular exercise at present, Type of exercise: walking, Time (Minutes): 10, Frequency (Times/Week): 7, Weekly Exercise (Minutes/Week): 70, Intensity: Mild, Exercise limited by: orthopedic condition(s)   Goals Addressed             This Visit's Progress    Exercise 150 min/wk Moderate Activity   Not on track      Depression Screen PHQ 2/9 Scores 07/16/2021 06/19/2021 12/19/2020 06/20/2020 02/29/2020 12/14/2019 06/10/2019  PHQ - 2 Score 0 0 0 0 0 0 0  PHQ- 9 Score 1 0 - - - - -    Fall Risk Fall Risk  07/16/2021 06/19/2021 12/19/2020 06/20/2020 02/29/2020  Falls in the past year? 0 0 0 0 0  Number falls in past yr: 0 - - - -  Injury with Fall? 0 - - - -  Risk for fall due to : Orthopedic patient - - - -  Follow up Falls prevention discussed - - - -    FALL RISK PREVENTION PERTAINING TO THE HOME:  Any stairs in or around the home? Yes  If so, are there any without handrails? No  Home free of loose throw rugs in walkways, pet beds, electrical cords, etc? Yes  Adequate lighting in your home to reduce risk of falls? Yes   ASSISTIVE DEVICES UTILIZED TO PREVENT FALLS:  Life alert? No  Use of a cane, walker or w/c? No  Grab bars in the bathroom? No  Shower chair or bench in shower? No  Elevated toilet seat or a handicapped toilet? No   TIMED UP AND GO:  Was the test performed? No . Telephonic visit  Cognitive Function: Normal cognitive status assessed by direct observation by this Nurse Health Advisor. No abnormalities found.    MMSE - Mini Mental State Exam 06/02/2018 05/21/2016 01/10/2015  Orientation to time 5 5 5  Orientation to Place 5 5 5  Registration 3 3 3  Attention/ Calculation 5 5 5  Recall 3 3 3  Language- name 2 objects 2 2 2  Language- repeat 1 - 1  Language- follow 3 step command 3 - 3  Language- read & follow direction 1 - 1  Write a sentence 1 - 1  Copy design 1 - 1  Total score 30 - 30          Immunizations Immunization  History  Administered Date(s) Administered   Fluad Quad(high Dose 65+) 06/10/2019, 06/20/2020, 06/19/2021   Influenza Split 07/05/2020   Influenza, High Dose Seasonal PF 08/13/2016, 08/18/2017, 08/11/2018   Influenza,inj,Quad PF,6+ Mos 07/25/2014, 07/28/2015   Moderna Covid-19 Vaccine Bivalent Booster 37yr & up 07/10/2021   Moderna SARS-COV2 Booster Vaccination 07/17/2020   Moderna Sars-Covid-2 Vaccination 10/13/2019, 11/10/2019, 01/30/2021   Pneumococcal Conjugate-13 07/25/2014   Pneumococcal Polysaccharide-23 06/07/2011   Tdap 06/07/2011   Zoster, Live 01/25/2015    TDAP status: Due, Education has been provided regarding the importance of this vaccine. Advised may receive this vaccine at local pharmacy or Health Dept. Aware to provide a copy of the vaccination record if obtained from local pharmacy or Health Dept. Verbalized acceptance and understanding.  Flu Vaccine status: Up to date  Pneumococcal vaccine status: Up to date  Covid-19 vaccine status: Completed vaccines  Qualifies for Shingles Vaccine? Yes   Zostavax completed Yes   Shingrix Completed?: No.    Education has been provided regarding the importance of this vaccine. Patient has been advised to call insurance company to determine out of pocket expense if they have not yet received this vaccine. Advised may also receive vaccine at local pharmacy or Health Dept. Verbalized acceptance and understanding.  Screening Tests Health Maintenance  Topic Date Due   Zoster Vaccines- Shingrix (1 of 2) 09/18/2021 (Originally 01/01/1994)   TETANUS/TDAP  06/19/2022 (Originally 06/06/2021)   MAMMOGRAM  10/24/2021   DEXA SCAN  06/20/2025   Pneumonia Vaccine 75 Years old  Completed   INFLUENZA VACCINE  Completed   COVID-19 Vaccine  Completed   Hepatitis C Screening  Completed   HPV VACCINES  Aged Out    Health Maintenance  There are no preventive care reminders to display for this patient.  Colorectal cancer screening: Type of  screening: Colonoscopy. Completed 10/25/2015. Repeat every 5 years  Mammogram status: Completed 10/24/2020. Repeat every year  Bone Density status: Completed 06/20/2020. Results reflect: Bone density results: NORMAL. Repeat every 5 years.  Lung Cancer Screening: (Low Dose CT Chest recommended if Age 77-80years, 30 pack-year currently smoking OR have quit w/in 15years.) does not qualify.   Additional Screening:  Hepatitis C Screening: does qualify; Completed 07/28/2015  Vision Screening: Recommended annual ophthalmology exams for early detection of glaucoma and other disorders of the eye. Is the patient up to date with their annual eye exam?  Yes  Who is the provider or what is the name of the office in which the patient attends annual eye exams? [facility]ingIf pt is not established with a provider, would they like to be referred to a provider to establish care? No .   Dental Screening: Recommended annual dental exams for proper oral hygiene  Community Resource Referral / Chronic Care Management: CRR required this visit?  No   CCM required this visit?  No      Plan:     I have personally reviewed and noted the following in the patient's chart:   Medical and social history Use of alcohol, tobacco or illicit drugs  Current medications and supplements including opioid prescriptions.  Functional ability and status Nutritional status Physical activity Advanced directives List of other physicians Hospitalizations, surgeries, and ER visits in previous 12 months Vitals Screenings to include cognitive, depression, and falls Referrals and appointments  In addition, I have reviewed and discussed with patient certain preventive protocols, quality metrics, and best practice recommendations. A written personalized care plan for preventive services as well  as general preventive health recommendations were provided to patient.     Sandrea Hammond, LPN   [id number]   Nurse Notes:  None

## 2021-07-30 DIAGNOSIS — M5451 Vertebrogenic low back pain: Secondary | ICD-10-CM | POA: Diagnosis not present

## 2021-08-07 ENCOUNTER — Encounter: Payer: Self-pay | Admitting: Physical Therapy

## 2021-08-07 ENCOUNTER — Ambulatory Visit: Payer: Medicare PPO | Attending: Specialist | Admitting: Physical Therapy

## 2021-08-07 ENCOUNTER — Other Ambulatory Visit: Payer: Self-pay

## 2021-08-07 DIAGNOSIS — M545 Low back pain, unspecified: Secondary | ICD-10-CM | POA: Diagnosis not present

## 2021-08-07 DIAGNOSIS — R29898 Other symptoms and signs involving the musculoskeletal system: Secondary | ICD-10-CM | POA: Diagnosis not present

## 2021-08-07 NOTE — Therapy (Signed)
[facility] [place], [place], [address] Phone: [phone]   Fax:  [phone]  Physical Therapy Evaluation  Patient Details  Name: Sarah Benton MRN: [id number] Date of Birth: 30-Nov-1945 Referring Provider (PT): Susa Day MD   Encounter Date: 08/07/2021   PT End of Session - 08/07/21 1228     Visit Number 1    Number of Visits 12    Date for PT Re-Evaluation 09/18/21    Authorization Type FOTO AT LEAST EVERY 5TH VISIT.  PROGRESS NOTE AT 10TH VISIT.  KX MODIFIER AFTER 15 VISITS.    PT Start Time 1030    PT Stop Time 1118    PT Time Calculation (min) 48 min    Activity Tolerance Patient tolerated treatment well    Behavior During Therapy WFL for tasks assessed/performed             Past Medical History:  Diagnosis Date   Anemia    Iron def   Arthritis    Cataract    Edema    Esophageal stricture    GERD (gastroesophageal reflux disease)    Hyperlipidemia    Hypertension    Obesity    Ruptured lumbar disc     Past Surgical History:  Procedure Laterality Date   ABDOMINAL HYSTERECTOMY Bilateral 2001   complete   BREAST BIOPSY  1993   Left/negative   BREAST CYST EXCISION Left    LUMBAR LAMINECTOMY  1998   ruptured disc   TONSILLECTOMY     TUBAL LIGATION  1979    There were no vitals filed for this visit.    Subjective Assessment - 08/07/21 1232     Subjective COVID-19 screen performed prior to patient entering clinic.  The patient presents to the clinic today with c/o a severe flare-up of low back pain in September of this year.  Her pain is rated at a 6/10 today but can rise to severe levels when walking and standing 10 minutes or less.  Sitting and Prednisone decrease her pain.  Her pain is right sided.    Pertinent History Lumbar laminectomy, OA, HTN.    How long can you stand comfortably? Less than 10 minutes.    How long can you walk comfortably? Less than 10 minutes.    Patient  Stated Goals Reduce pain and walking/stand/perform ADL's.    Currently in Pain? Yes    Pain Score 6     Pain Location Back    Pain Orientation Right    Pain Descriptors / Indicators Burning;Throbbing    Pain Type Acute pain    Pain Onset More than a month ago    Pain Frequency Constant    Aggravating Factors  See above.    Pain Relieving Factors See above.                [facility] PT Assessment - 08/07/21 0001       Assessment   Medical Diagnosis Vertebrogenic back pain.    Referring Provider (PT) Susa Day MD    Onset Date/Surgical Date --   05/2021.     Precautions   Precautions None      Restrictions   Weight Bearing Restrictions No      Balance Screen   Has the patient fallen in the past 6 months No    Has the patient had a decrease in activity level because of a fear of falling?  No    Is the patient reluctant to  leave their home because of a fear of falling?  No      Home Environment   Living Environment Private residence      Prior Function   Level of Independence Independent      Observation/Other Assessments   Focus on Therapeutic Outcomes (FOTO)  Complete.      Posture/Postural Control   Posture/Postural Control Postural limitations    Postural Limitations Rounded Shoulders;Forward head      Deep Tendon Reflexes   DTR Assessment Site Patella;Achilles    Patella DTR 2+    Achilles DTR --   RT is 1+/4+ and LT is absent.     ROM / Strength   AROM / PROM / Strength AROM;Strength      AROM   Overall AROM Comments Full active lumbar flexion and active extension to 20 degrees.      Strength   Overall Strength Comments Normal LE strength.      Palpation   Palpation comment Tender tender to palpation over patient's right SIJ and upper glteal musculature.      Special Tests   Other special tests Equal leg lengths, (-) SLR test. (+) right FABER test.      Ambulation/Gait   Gait Comments Essentially normal.                         Objective measurements completed on examination: See above findings.       [facility] Adult PT Treatment/Exercise - 08/07/21 0001       Modalities   Modalities Electrical Stimulation;Moist Heat      Moist Heat Therapy   Number Minutes Moist Heat 20 Minutes    Moist Heat Location Lumbar Spine      Electrical Stimulation   Electrical Stimulation Location Right low back.    Electrical Stimulation Action IFC at 80-150 Hz.    Electrical Stimulation Parameters 40% scan x 20 minutes.    Electrical Stimulation Goals Pain                          PT Long Term Goals - 08/07/21 1247       PT LONG TERM GOAL #1   Title Independent with a HEP.    Time 6    Period Weeks    Status New      PT LONG TERM GOAL #2   Title Stand 20 minutes with pain not > 3/10.    Time 6    Period Weeks    Status New      PT LONG TERM GOAL #3   Title Walk a community distance with pain not > 3/10.    Time 6    Period Weeks    Status New      PT LONG TERM GOAL #4   Title Perform ADL's with pain not > 3-4/10.    Time 6    Period Weeks    Status New                    Plan - 08/07/21 1240     Clinical Impression Statement The patient presents to [facility] with c/o right-sided low back pain that become severe when standing and walking less than 10 minutes.  This has been ongoing since September of this year.  She is very tender to palpation over her right SIJ and upper gluteal musculature.  She demonstrates a positive right FABER test.  Her  lumbar motion is quite good and her LE strength is normal.  Patient will benefit from skilled physical therapy intervention to address pain and deficits.    Personal Factors and Comorbidities Comorbidity 1;Other    Comorbidities Lumbar laminectomy, OA, HTN.    Examination-Activity Limitations Other;Stand;Locomotion Level    Examination-Participation Restrictions Other    Stability/Clinical Decision Making  Stable/Uncomplicated    Clinical Decision Making Low    Rehab Potential Excellent    PT Frequency 2x / week    PT Duration 6 weeks    PT Treatment/Interventions ADLs/Self Care Home Management;Cryotherapy;Electrical Stimulation;Ultrasound;Moist Heat;Therapeutic activities;Therapeutic exercise;Manual techniques;Patient/family education;Passive range of motion;Dry needling    PT Next Visit Plan Combo e'stim/US, STW/M, flexion-biased exercises.    Consulted and Agree with Plan of Care Patient             Patient will benefit from skilled therapeutic intervention in order to improve the following deficits and impairments:  Decreased activity tolerance, Decreased range of motion, Pain  Visit Diagnosis: Acute right-sided low back pain without sciatica - Plan: PT plan of care cert/re-cert     Problem List Patient Active Problem List   Diagnosis Date Noted   Sciatic leg pain    Vitamin D deficiency 02/06/2016   Generalized edema 07/28/2015   GERD 11/08/2010   MIXED HYPERLIPIDEMIA 12/12/2008   Essential hypertension, benign 12/12/2008   Morbid obesity ([place]) 12/12/2008    Ether Wolters, [name], PT 08/07/2021, 12:50 PM  [facility] [address] [place], [place], [address] Phone: [phone]   Fax:  [phone]  Name: Sarah Benton MRN: [id number] Date of Birth: April 21, 1946

## 2021-08-08 ENCOUNTER — Encounter: Payer: Self-pay | Admitting: Physical Therapy

## 2021-08-08 ENCOUNTER — Ambulatory Visit: Payer: Medicare PPO | Admitting: Physical Therapy

## 2021-08-08 ENCOUNTER — Other Ambulatory Visit: Payer: Self-pay

## 2021-08-08 DIAGNOSIS — M545 Low back pain, unspecified: Secondary | ICD-10-CM | POA: Diagnosis not present

## 2021-08-08 DIAGNOSIS — R29898 Other symptoms and signs involving the musculoskeletal system: Secondary | ICD-10-CM | POA: Diagnosis not present

## 2021-08-08 NOTE — Therapy (Signed)
[facility] [place], [place], [address] Phone: [phone]   Fax:  [phone]  Physical Therapy Treatment  Patient Details  Name: Sarah Benton MRN: [id number] Date of Birth: 1946/01/28 Referring Provider (PT): Susa Day MD   Encounter Date: 08/08/2021   PT End of Session - 08/08/21 0938     Visit Number 2    Number of Visits 12    Date for PT Re-Evaluation 09/18/21    Authorization Type FOTO AT LEAST EVERY 5TH VISIT.  PROGRESS NOTE AT 10TH VISIT.  KX MODIFIER AFTER 15 VISITS.    PT Start Time 0900    PT Stop Time 0955    PT Time Calculation (min) 55 min    Activity Tolerance Patient tolerated treatment well    Behavior During Therapy WFL for tasks assessed/performed             Past Medical History:  Diagnosis Date   Anemia    Iron def   Arthritis    Cataract    Edema    Esophageal stricture    GERD (gastroesophageal reflux disease)    Hyperlipidemia    Hypertension    Obesity    Ruptured lumbar disc     Past Surgical History:  Procedure Laterality Date   ABDOMINAL HYSTERECTOMY Bilateral 2001   complete   BREAST BIOPSY  1993   Left/negative   BREAST CYST EXCISION Left    LUMBAR LAMINECTOMY  1998   ruptured disc   TONSILLECTOMY     TUBAL LIGATION  1979    There were no vitals filed for this visit.   Subjective Assessment - 08/08/21 0939     Subjective COVID-19 screen performed prior to patient entering clinic.  About the same.    Pertinent History Lumbar laminectomy, OA, HTN.    How long can you stand comfortably? Less than 10 minutes.    How long can you walk comfortably? Less than 10 minutes.    Patient Stated Goals Reduce pain and walking/stand/perform ADL's.    Currently in Pain? Yes    Pain Score 6     Pain Location Back    Pain Orientation Right    Pain Descriptors / Indicators Burning;Throbbing    Pain Type Acute pain    Pain Onset More than a month ago                                [facility] Adult PT Treatment/Exercise - 08/08/21 0001       Modalities   Modalities Electrical Stimulation;Moist Heat;Ultrasound      Moist Heat Therapy   Number Minutes Moist Heat 20 Minutes    Moist Heat Location Lumbar Spine      Electrical Stimulation   Electrical Stimulation Location RT LB/SIJ/Upper gluteal region.    Electrical Stimulation Action IFC at 80-150 Hz.    Electrical Stimulation Parameters 40% scan x 20 minutes.    Electrical Stimulation Goals Pain;Tone      Ultrasound   Ultrasound Location left sdly position with pillow betweend knees for comfort:  RT LB/SIJ region.    Ultrasound Parameters Combo e'stim/US at 1.50 x 12 minutes.    Ultrasound Goals Pain      Manual Therapy   Manual Therapy Soft tissue mobilization    Soft tissue mobilization STW/M x 11 minutes to patient's right low back, SIJ and upper gluteal musculature to reduce pain and tone.  PT Long Term Goals - 08/07/21 1247       PT LONG TERM GOAL #1   Title Independent with a HEP.    Time 6    Period Weeks    Status New      PT LONG TERM GOAL #2   Title Stand 20 minutes with pain not > 3/10.    Time 6    Period Weeks    Status New      PT LONG TERM GOAL #3   Title Walk a community distance with pain not > 3/10.    Time 6    Period Weeks    Status New      PT LONG TERM GOAL #4   Title Perform ADL's with pain not > 3-4/10.    Time 6    Period Weeks    Status New                   Plan - 08/08/21 0951     Clinical Impression Statement Patient presents to the clinic today feeling about the same.  She was found to be tender to palpation over her right QL.  She did well with STW/M.  Instructed her in single and double knee to chest stretches for her HEP.    Personal Factors and Comorbidities Comorbidity 1;Other    Comorbidities Lumbar laminectomy, OA, HTN.    Examination-Activity Limitations  Other;Stand;Locomotion Level    Examination-Participation Restrictions Other    Stability/Clinical Decision Making Stable/Uncomplicated    Rehab Potential Excellent    PT Frequency 2x / week    PT Duration 6 weeks    PT Treatment/Interventions ADLs/Self Care Home Management;Cryotherapy;Electrical Stimulation;Ultrasound;Moist Heat;Therapeutic activities;Therapeutic exercise;Manual techniques;Patient/family education;Passive range of motion;Dry needling    PT Next Visit Plan Combo e'stim/US, STW/M, flexion-biased exercises.    Consulted and Agree with Plan of Care Patient             Patient will benefit from skilled therapeutic intervention in order to improve the following deficits and impairments:  Decreased activity tolerance, Decreased range of motion, Pain  Visit Diagnosis: Acute right-sided low back pain without sciatica     Problem List Patient Active Problem List   Diagnosis Date Noted   Sciatic leg pain    Vitamin D deficiency 02/06/2016   Generalized edema 07/28/2015   GERD 11/08/2010   MIXED HYPERLIPIDEMIA 12/12/2008   Essential hypertension, benign 12/12/2008   Morbid obesity ([place]) 12/12/2008    Sarah Benton, [name], PT 08/08/2021, 9:59 AM  [facility] [address] [place], [place], [address] Phone: [phone]   Fax:  [phone]  Name: Sarah Benton MRN: [id number] Date of Birth: December 14, 1945

## 2021-08-08 NOTE — Patient Instructions (Signed)
[employer] EXERCISE PROGRAM Created by [name] Eleanna Theilen Nov 16th, 2022 View at [email or web address] using code: [email or web address] Total 2 Page 1 of 1 SINGLE KNEE TO CHEST STRETCH - SKTC While Lying on your back, hold your knee and gently pull it up towards your chest. Repeat 3 Times Hold 30 Seconds Complete 1 Set Perform 3 Times a Day Double Knee to chest stretch Pull your knees up to your chest hold for 30 seconds then relax. Repeat Continue to breathe throughout stretch. Repeat 1 Time Hold 30 Seconds Complete 3 Sets Perform 2 Times a Day

## 2021-08-14 ENCOUNTER — Ambulatory Visit: Payer: Medicare PPO | Admitting: Physical Therapy

## 2021-08-14 ENCOUNTER — Other Ambulatory Visit: Payer: Self-pay

## 2021-08-14 DIAGNOSIS — M545 Low back pain, unspecified: Secondary | ICD-10-CM | POA: Diagnosis not present

## 2021-08-14 DIAGNOSIS — R29898 Other symptoms and signs involving the musculoskeletal system: Secondary | ICD-10-CM | POA: Diagnosis not present

## 2021-08-14 NOTE — Therapy (Signed)
[facility] [place], [place], [address] Phone: [phone]   Fax:  [phone]  Physical Therapy Treatment  Patient Details  Name: Sarah Benton MRN: [id number] Date of Birth: 1946-05-20 Referring Provider (PT): Susa Day MD   Encounter Date: 08/14/2021   PT End of Session - 08/14/21 0942     Visit Number 3    Number of Visits 12    Date for PT Re-Evaluation 09/18/21    Authorization Type FOTO AT LEAST EVERY 5TH VISIT.  PROGRESS NOTE AT 10TH VISIT.  KX MODIFIER AFTER 15 VISITS.    PT Start Time 0902    PT Stop Time 0957    PT Time Calculation (min) 55 min    Activity Tolerance Patient tolerated treatment well    Behavior During Therapy WFL for tasks assessed/performed             Past Medical History:  Diagnosis Date   Anemia    Iron def   Arthritis    Cataract    Edema    Esophageal stricture    GERD (gastroesophageal reflux disease)    Hyperlipidemia    Hypertension    Obesity    Ruptured lumbar disc     Past Surgical History:  Procedure Laterality Date   ABDOMINAL HYSTERECTOMY Bilateral 2001   complete   BREAST BIOPSY  1993   Left/negative   BREAST CYST EXCISION Left    LUMBAR LAMINECTOMY  1998   ruptured disc   TONSILLECTOMY     TUBAL LIGATION  1979    There were no vitals filed for this visit.   Subjective Assessment - 08/14/21 0942     Subjective COVID-19 screen performed prior to patient entering clinic.  Sore after last treatment but then got better.  Pain at a 2 today.    Pertinent History Lumbar laminectomy, OA, HTN.    How long can you stand comfortably? Less than 10 minutes.    How long can you walk comfortably? Less than 10 minutes.    Patient Stated Goals Reduce pain and walking/stand/perform ADL's.    Currently in Pain? Yes    Pain Score 2     Pain Location Back    Pain Orientation Right    Pain Descriptors / Indicators Aching;Dull    Pain Type Acute pain    Pain  Onset More than a month ago                               [facility] Adult PT Treatment/Exercise - 08/14/21 0001       Exercises   Exercises Knee/Hip      Knee/Hip Exercises: Aerobic   Nustep Level 3 x 12 minutes.      Modalities   Modalities Electrical Stimulation;Moist Heat      Moist Heat Therapy   Number Minutes Moist Heat 20 Minutes    Moist Heat Location Lumbar Spine      Electrical Stimulation   Electrical Stimulation Location RT LB/SIJ region.    Electrical Stimulation Action IFC at 80-150 Hz.    Electrical Stimulation Parameters 40% scan x 20 minutes.    Electrical Stimulation Goals Pain;Tone      Manual Therapy   Manual Therapy Passive ROM;Soft tissue mobilization    Soft tissue mobilization STW/M x 9 minutes to patient's right low back/SIJ region to reduce tone.    Passive ROM 1-1 manual stretching with sustained hold  SKTC 2 minutes each side and 1 minute sustained DKTC stretch.                          PT Long Term Goals - 08/07/21 1247       PT LONG TERM GOAL #1   Title Independent with a HEP.    Time 6    Period Weeks    Status New      PT LONG TERM GOAL #2   Title Stand 20 minutes with pain not > 3/10.    Time 6    Period Weeks    Status New      PT LONG TERM GOAL #3   Title Walk a community distance with pain not > 3/10.    Time 6    Period Weeks    Status New      PT LONG TERM GOAL #4   Title Perform ADL's with pain not > 3-4/10.    Time 6    Period Weeks    Status New                   Plan - 08/14/21 0947     Clinical Impression Statement Very good responses to treatment thus far with a 2/10 pain-level upon presentation to the clinic today.  She did great with Nustep exercise and performed without complaint.    Personal Factors and Comorbidities Comorbidity 1;Other    Comorbidities Lumbar laminectomy, OA, HTN.    Examination-Activity Limitations Other;Stand;Locomotion Level     Examination-Participation Restrictions Other    Stability/Clinical Decision Making Stable/Uncomplicated    Rehab Potential Excellent    PT Frequency 2x / week    PT Duration 6 weeks    PT Treatment/Interventions ADLs/Self Care Home Management;Cryotherapy;Electrical Stimulation;Ultrasound;Moist Heat;Therapeutic activities;Therapeutic exercise;Manual techniques;Patient/family education;Passive range of motion;Dry needling    PT Next Visit Plan Combo e'stim/US, STW/M, flexion-biased exercises.    Consulted and Agree with Plan of Care Patient             Patient will benefit from skilled therapeutic intervention in order to improve the following deficits and impairments:  Decreased activity tolerance, Decreased range of motion, Pain  Visit Diagnosis: Acute right-sided low back pain without sciatica     Problem List Patient Active Problem List   Diagnosis Date Noted   Sciatic leg pain    Vitamin D deficiency 02/06/2016   Generalized edema 07/28/2015   GERD 11/08/2010   MIXED HYPERLIPIDEMIA 12/12/2008   Essential hypertension, benign 12/12/2008   Morbid obesity ([place]) 12/12/2008    Sarah Benton, [name], PT 08/14/2021, 10:16 AM  [facility] [address] [place], [place], [address] Phone: [phone]   Fax:  [phone]  Name: Sarah Benton MRN: [id number] Date of Birth: 04/09/1946

## 2021-08-20 ENCOUNTER — Other Ambulatory Visit: Payer: Self-pay

## 2021-08-20 ENCOUNTER — Ambulatory Visit: Payer: Medicare PPO

## 2021-08-20 DIAGNOSIS — M545 Low back pain, unspecified: Secondary | ICD-10-CM

## 2021-08-20 DIAGNOSIS — R29898 Other symptoms and signs involving the musculoskeletal system: Secondary | ICD-10-CM | POA: Diagnosis not present

## 2021-08-20 NOTE — Therapy (Signed)
[facility] [place], [place], [address] Phone: [phone]   Fax:  [phone]  Physical Therapy Treatment  Patient Details  Name: Sarah Benton MRN: [id number] Date of Birth: [birth year] Referring Provider (PT): Susa Day MD   Encounter Date: 08/20/2021   PT End of Session - 08/20/21 1355     Visit Number 4    Number of Visits 12    Date for PT Re-Evaluation 09/18/21    Authorization Type FOTO AT LEAST EVERY 5TH VISIT.  PROGRESS NOTE AT 10TH VISIT.  KX MODIFIER AFTER 15 VISITS.    PT Start Time 6283    PT Stop Time 1433    PT Time Calculation (min) 48 min    Activity Tolerance Patient tolerated treatment well    Behavior During Therapy WFL for tasks assessed/performed             Past Medical History:  Diagnosis Date   Anemia    Iron def   Arthritis    Cataract    Edema    Esophageal stricture    GERD (gastroesophageal reflux disease)    Hyperlipidemia    Hypertension    Obesity    Ruptured lumbar disc     Past Surgical History:  Procedure Laterality Date   ABDOMINAL HYSTERECTOMY Bilateral 2001   complete   BREAST BIOPSY  1993   Left/negative   BREAST CYST EXCISION Left    LUMBAR LAMINECTOMY  1998   ruptured disc   TONSILLECTOMY     TUBAL LIGATION  1979    There were no vitals filed for this visit.   Subjective Assessment - 08/20/21 1354     Subjective COVID-19 screen performed prior to patient entering clinic.  Pt arrives for today's treatment session reporting mild soreness, but no pain.    Pertinent History Lumbar laminectomy, OA, HTN.    How long can you stand comfortably? Less than 10 minutes.    How long can you walk comfortably? Less than 10 minutes.    Patient Stated Goals Reduce pain and walking/stand/perform ADL's.    Currently in Pain? No/denies    Pain Onset More than a month ago                               [facility] Adult PT Treatment/Exercise -  08/20/21 0001       Knee/Hip Exercises: Aerobic   Nustep Lvl 3 x 15 mins      Modalities   Modalities Electrical Stimulation      Moist Heat Therapy   Number Minutes Moist Heat 15 Minutes    Moist Heat Location Lumbar Spine      Electrical Stimulation   Electrical Stimulation Location Right LB/SIJ region    Electrical Stimulation Action IFC at 80-150 Hz    Electrical Stimulation Parameters 40% scan x 15 mins    Electrical Stimulation Goals Tone;Pain      Manual Therapy   Manual Therapy Soft tissue mobilization    Soft tissue mobilization STW/M to pt's right low back, SIJ and upper glute musculature to decrease pain and tone                          PT Long Term Goals - 08/07/21 1247       PT LONG TERM GOAL #1   Title Independent with a HEP.    Time 6  Period Weeks    Status New      PT LONG TERM GOAL #2   Title Stand 20 minutes with pain not > 3/10.    Time 6    Period Weeks    Status New      PT LONG TERM GOAL #3   Title Walk a community distance with pain not > 3/10.    Time 6    Period Weeks    Status New      PT LONG TERM GOAL #4   Title Perform ADL's with pain not > 3-4/10.    Time 6    Period Weeks    Status New                   Plan - 08/20/21 1355     Clinical Impression Statement Pt arrives for today's treatment session reporting slight low back soreness, but no pain.  STW/M performed to right low back, SIJ region, and upper glute to decrease pain and tone.  Pt responds well to STW and estim.  Discussed sacroilliac belts and pt is going to look into it.  Pt reported 3/10 right SIJ pain at completion of today's treatment session.    Personal Factors and Comorbidities Comorbidity 1;Other    Comorbidities Lumbar laminectomy, OA, HTN.    Examination-Activity Limitations Other;Stand;Locomotion Level    Examination-Participation Restrictions Other    Stability/Clinical Decision Making Stable/Uncomplicated    Rehab Potential  Excellent    PT Frequency 2x / week    PT Duration 6 weeks    PT Treatment/Interventions ADLs/Self Care Home Management;Cryotherapy;Electrical Stimulation;Ultrasound;Moist Heat;Therapeutic activities;Therapeutic exercise;Manual techniques;Patient/family education;Passive range of motion;Dry needling    PT Next Visit Plan Combo e'stim/US, STW/M, flexion-biased exercises.    Consulted and Agree with Plan of Care Patient             Patient will benefit from skilled therapeutic intervention in order to improve the following deficits and impairments:  Decreased activity tolerance, Decreased range of motion, Pain  Visit Diagnosis: Acute right-sided low back pain without sciatica     Problem List Patient Active Problem List   Diagnosis Date Noted   Sciatic leg pain    Vitamin D deficiency 02/06/2016   Generalized edema 07/28/2015   GERD 11/08/2010   MIXED HYPERLIPIDEMIA 12/12/2008   Essential hypertension, benign 12/12/2008   Morbid obesity ([place]) 12/12/2008    Kathrynn Ducking, PTA 08/20/2021, 2:34 PM  [facility] [address] [place], [place], [address] Phone: [phone]   Fax:  [phone]  Name: Sarah Benton MRN: [id number] Date of Birth: 10/28/1945

## 2021-08-21 ENCOUNTER — Other Ambulatory Visit: Payer: Self-pay | Admitting: Nurse Practitioner

## 2021-08-21 ENCOUNTER — Ambulatory Visit: Payer: Medicare PPO | Admitting: Physical Therapy

## 2021-08-21 ENCOUNTER — Ambulatory Visit: Payer: Medicare PPO | Admitting: Nurse Practitioner

## 2021-08-21 ENCOUNTER — Encounter: Payer: Self-pay | Admitting: Nurse Practitioner

## 2021-08-21 VITALS — BP 101/68 | HR 76 | Temp 98.4°F | Resp 20 | Ht 62.0 in | Wt 213.0 lb

## 2021-08-21 DIAGNOSIS — R300 Dysuria: Secondary | ICD-10-CM | POA: Diagnosis not present

## 2021-08-21 DIAGNOSIS — N3000 Acute cystitis without hematuria: Secondary | ICD-10-CM | POA: Diagnosis not present

## 2021-08-21 LAB — URINALYSIS, COMPLETE
Bilirubin, UA: NEGATIVE
Nitrite, UA: POSITIVE — AB
Specific Gravity, UA: 1.02 (ref 1.005–1.030)
Urobilinogen, Ur: 2 mg/dL — ABNORMAL HIGH (ref 0.2–1.0)
pH, UA: 5 (ref 5.0–7.5)

## 2021-08-21 LAB — MICROSCOPIC EXAMINATION: Renal Epithel, UA: NONE SEEN /hpf

## 2021-08-21 MED ORDER — CIPROFLOXACIN HCL 500 MG PO TABS: 500.0000 mg | ORAL_TABLET | Freq: Two times a day (BID) | ORAL | 0 refills | Status: AC

## 2021-08-21 MED ORDER — TRIAMCINOLONE ACETONIDE 0.1 % EX CREA: TOPICAL_CREAM | Freq: Two times a day (BID) | CUTANEOUS | 5 refills | Status: DC | PRN

## 2021-08-21 NOTE — Patient Instructions (Signed)

## 2021-08-21 NOTE — Progress Notes (Signed)
   Subjective:    Patient ID: Sarah Benton, female    DOB: 1946/09/12, 75 y.o.   MRN: [id number]   Chief Complaint: Dysuria   HPI Patient states that  she has dysuria and urinary frequency with scants amount of voiding. Started on Sunday. She has been taking AZO which has helped some. Denies back pain but has some supra pubic pressure.    Review of Systems  Constitutional:  Negative for diaphoresis.  Eyes:  Negative for pain.  Respiratory:  Negative for shortness of breath.   Cardiovascular:  Negative for chest pain, palpitations and leg swelling.  Gastrointestinal:  Negative for abdominal pain.  Endocrine: Negative for polydipsia.  Genitourinary:  Positive for dysuria, frequency and urgency. Negative for flank pain and hematuria.  Skin:  Negative for rash.  Neurological:  Negative for dizziness, weakness and headaches.  Hematological:  Does not bruise/bleed easily.  All other systems reviewed and are negative.     Objective:   Physical Exam Vitals and nursing note reviewed.  Constitutional:      Appearance: Normal appearance.  Cardiovascular:     Rate and Rhythm: Normal rate and regular rhythm.  Pulmonary:     Effort: Pulmonary effort is normal.     Breath sounds: Normal breath sounds.  Abdominal:     General: Abdomen is flat. Bowel sounds are normal.     Palpations: Abdomen is soft.     Tenderness: There is no right CVA tenderness or left CVA tenderness.  Skin:    General: Skin is warm.  Neurological:     General: No focal deficit present.     Mental Status: She is alert and oriented to person, place, and time.   BP 101/68   Pulse 76   Temp 98.4 F (36.9 C) (Temporal)   Resp 20   Ht 5\' 2"  (1.575 m)   Wt 213 lb (96.6 kg)   SpO2 97%   BMI 38.96 kg/m   U/A pos nitrites       Assessment & Plan:  Sarah Benton in today with chief complaint of Dysuria   1. Dysuria - Urinalysis, Complete  2. Acute cystitis without hematuria Take medication as  prescribe Cotton underwear Take shower not bath Cranberry juice, yogurt Force fluids AZO over the counter X2 days Culture pending RTO prn  - Urine Culture Meds ordered this encounter  Medications   ketoconazole 2%-triamcinolone 0.1% 1:2 cream mixture    Sig: Apply topically 2 (two) times daily as needed.    Dispense:  45 g    Refill:  5    Order Specific Question:   Supervising Provider    Answer:   Caryl Pina A [[id number]]   ciprofloxacin (CIPRO) 500 MG tablet    Sig: Take 1 tablet (500 mg total) by mouth 2 (two) times daily for 3 days.    Dispense:  6 tablet    Refill:  0    Order Specific Question:   Supervising Provider    Answer:   Caryl Pina A [[id number]]      The above assessment and management plan was discussed with the patient. The patient verbalized understanding of and has agreed to the management plan. Patient is aware to call the clinic if symptoms persist or worsen. Patient is aware when to return to the clinic for a follow-up visit. Patient educated on when it is appropriate to go to the emergency department.   [name], FNP

## 2021-08-23 ENCOUNTER — Encounter: Payer: Self-pay | Admitting: Physical Therapy

## 2021-08-23 ENCOUNTER — Other Ambulatory Visit: Payer: Self-pay

## 2021-08-23 ENCOUNTER — Ambulatory Visit: Payer: Medicare PPO | Attending: Specialist | Admitting: Physical Therapy

## 2021-08-23 DIAGNOSIS — R29898 Other symptoms and signs involving the musculoskeletal system: Secondary | ICD-10-CM | POA: Insufficient documentation

## 2021-08-23 DIAGNOSIS — M545 Low back pain, unspecified: Secondary | ICD-10-CM | POA: Insufficient documentation

## 2021-08-23 NOTE — Therapy (Signed)
[facility] [place], [place], [address] Phone: [phone]   Fax:  [phone]  Physical Therapy Treatment  Patient Details  Name: Sarah Benton MRN: [id number] Date of Birth: September 16, 1946 Referring Provider (PT): Susa Day MD   Encounter Date: 08/23/2021   PT End of Session - 08/23/21 1351     Visit Number 5    Number of Visits 12    Date for PT Re-Evaluation 09/18/21    Authorization Type FOTO AT LEAST EVERY 5TH VISIT.  PROGRESS NOTE AT 10TH VISIT.  KX MODIFIER AFTER 15 VISITS.    PT Start Time 0145    PT Stop Time 0225    PT Time Calculation (min) 40 min    Activity Tolerance Patient tolerated treatment well    Behavior During Therapy WFL for tasks assessed/performed             Past Medical History:  Diagnosis Date   Anemia    Iron def   Arthritis    Cataract    Edema    Esophageal stricture    GERD (gastroesophageal reflux disease)    Hyperlipidemia    Hypertension    Obesity    Ruptured lumbar disc     Past Surgical History:  Procedure Laterality Date   ABDOMINAL HYSTERECTOMY Bilateral 2001   complete   BREAST BIOPSY  1993   Left/negative   BREAST CYST EXCISION Left    LUMBAR LAMINECTOMY  1998   ruptured disc   TONSILLECTOMY     TUBAL LIGATION  1979    There were no vitals filed for this visit.   Subjective Assessment - 08/23/21 1504     Subjective COVID-19 screen performed prior to patient entering clinic.                               [facility] Adult PT Treatment/Exercise - 08/23/21 0001       Exercises   Exercises Knee/Hip;Lumbar      Lumbar Exercises: Machines for Strengthening   Cybex Lumbar Extension 50# x 3 minutes.      Knee/Hip Exercises: Aerobic   Nustep Level 4 x 12 minutes.      Modalities   Modalities Electrical Stimulation;Moist Heat      Moist Heat Therapy   Number Minutes Moist Heat 10 Minutes    Moist Heat Location Lumbar Spine       Electrical Stimulation   Electrical Stimulation Location RT LB/SIJ    Electrical Stimulation Action Pre-mod. at 80-150 Hz. x 10 minutes (patient need to leave early today.    Electrical Stimulation Goals Tone;Pain      Manual Therapy   Manual Therapy Soft tissue mobilization    Soft tissue mobilization STW/M x 8 minutes                          PT Long Term Goals - 08/07/21 1247       PT LONG TERM GOAL #1   Title Independent with a HEP.    Time 6    Period Weeks    Status New      PT LONG TERM GOAL #2   Title Stand 20 minutes with pain not > 3/10.    Time 6    Period Weeks    Status New      PT LONG TERM GOAL #3   Title Walk a community  distance with pain not > 3/10.    Time 6    Period Weeks    Status New      PT LONG TERM GOAL #4   Title Perform ADL's with pain not > 3-4/10.    Time 6    Period Weeks    Status New                   Plan - 08/23/21 1509     Clinical Impression Statement Patient feels she is improving.  Her pain continues to rise with with prolonged standing.  She did very well with the addition of the back extension machine today.    Personal Factors and Comorbidities Comorbidity 1;Other    Comorbidities Lumbar laminectomy, OA, HTN.    Examination-Activity Limitations Other;Stand;Locomotion Level    Examination-Participation Restrictions Other    Stability/Clinical Decision Making Stable/Uncomplicated    Rehab Potential Excellent    PT Frequency 2x / week    PT Duration 6 weeks    PT Treatment/Interventions ADLs/Self Care Home Management;Cryotherapy;Electrical Stimulation;Ultrasound;Moist Heat;Therapeutic activities;Therapeutic exercise;Manual techniques;Patient/family education;Passive range of motion;Dry needling    PT Next Visit Plan Combo e'stim/US, STW/M, flexion-biased exercises.    Consulted and Agree with Plan of Care Patient             Patient will benefit from skilled therapeutic intervention in  order to improve the following deficits and impairments:  Decreased activity tolerance, Decreased range of motion, Pain  Visit Diagnosis: Acute right-sided low back pain without sciatica     Problem List Patient Active Problem List   Diagnosis Date Noted   Sciatic leg pain    Vitamin D deficiency 02/06/2016   Generalized edema 07/28/2015   GERD 11/08/2010   MIXED HYPERLIPIDEMIA 12/12/2008   Essential hypertension, benign 12/12/2008   Morbid obesity ([place]) 12/12/2008    Kamari Buch, [name], PT 08/23/2021, 3:12 PM  [facility] [address] [place], [place], [address] Phone: [phone]   Fax:  [phone]  Name: Sarah Benton MRN: [id number] Date of Birth: [birth year]

## 2021-08-24 ENCOUNTER — Encounter: Payer: Medicare PPO | Admitting: Physical Therapy

## 2021-08-24 LAB — URINE CULTURE

## 2021-08-27 ENCOUNTER — Ambulatory Visit: Payer: Medicare PPO | Admitting: Physical Therapy

## 2021-08-27 ENCOUNTER — Other Ambulatory Visit: Payer: Self-pay

## 2021-08-27 DIAGNOSIS — R29898 Other symptoms and signs involving the musculoskeletal system: Secondary | ICD-10-CM | POA: Diagnosis not present

## 2021-08-27 DIAGNOSIS — M545 Low back pain, unspecified: Secondary | ICD-10-CM | POA: Diagnosis not present

## 2021-08-27 NOTE — Therapy (Signed)
[facility] [place], [place], [address] Phone: [phone]   Fax:  [phone]  Physical Therapy Treatment  Patient Details  Name: Sarah Benton MRN: [id number] Date of Birth: November 11, 1945 Referring Provider (PT): Susa Day MD   Encounter Date: 08/27/2021   PT End of Session - 08/27/21 1448     Visit Number 6    Number of Visits 12    Date for PT Re-Evaluation 09/18/21    Authorization Type FOTO AT LEAST EVERY 5TH VISIT.  PROGRESS NOTE AT 10TH VISIT.  KX MODIFIER AFTER 15 VISITS.    PT Start Time 0145    PT Stop Time 0229    PT Time Calculation (min) 44 min    Activity Tolerance Patient tolerated treatment well    Behavior During Therapy WFL for tasks assessed/performed             Past Medical History:  Diagnosis Date   Anemia    Iron def   Arthritis    Cataract    Edema    Esophageal stricture    GERD (gastroesophageal reflux disease)    Hyperlipidemia    Hypertension    Obesity    Ruptured lumbar disc     Past Surgical History:  Procedure Laterality Date   ABDOMINAL HYSTERECTOMY Bilateral 2001   complete   BREAST BIOPSY  1993   Left/negative   BREAST CYST EXCISION Left    LUMBAR LAMINECTOMY  1998   ruptured disc   TONSILLECTOMY     TUBAL LIGATION  1979    There were no vitals filed for this visit.   Subjective Assessment - 08/27/21 1449     Subjective COVID-19 screen performed prior to patient entering clinic.  About the same.    Pertinent History Lumbar laminectomy, OA, HTN.    How long can you stand comfortably? Less than 10 minutes.    How long can you walk comfortably? Less than 10 minutes.    Patient Stated Goals Reduce pain and walking/stand/perform ADL's.    Currently in Pain? Yes    Pain Score 3     Pain Location Back    Pain Orientation Right    Pain Descriptors / Indicators Aching;Dull    Pain Type Acute pain    Pain Onset More than a month ago                                [facility] Adult PT Treatment/Exercise - 08/27/21 0001       Exercises   Exercises Knee/Hip      Lumbar Exercises: Aerobic   Nustep Level 5 x 11 minutes.      Modalities   Modalities Electrical Stimulation;Moist Heat      Moist Heat Therapy   Number Minutes Moist Heat 10 Minutes    Moist Heat Location Lumbar Spine      Electrical Stimulation   Electrical Stimulation Location RT LB/SIJ.    Electrical Stimulation Action IFC at 80-150 Hz.    Electrical Stimulation Parameters 40% scan x 10 minutes.    Electrical Stimulation Goals Tone;Pain      Manual Therapy   Manual Therapy Passive ROM    Soft tissue mobilization STW/M x 5 minutes to patient's right SIJ.    Passive ROM In supine:  1-1 right SKTC stretch prolonged with low laod long duration stretching technique utililized.  Also performed gentle right femoral traction sustained and  with oscilliations and also gentle right hip belt mobs to affect the right SIJ (10 minutes total).                          PT Long Term Goals - 08/07/21 1247       PT LONG TERM GOAL #1   Title Independent with a HEP.    Time 6    Period Weeks    Status New      PT LONG TERM GOAL #2   Title Stand 20 minutes with pain not > 3/10.    Time 6    Period Weeks    Status New      PT LONG TERM GOAL #3   Title Walk a community distance with pain not > 3/10.    Time 6    Period Weeks    Status New      PT LONG TERM GOAL #4   Title Perform ADL's with pain not > 3-4/10.    Time 6    Period Weeks    Status New                   Plan - 08/27/21 1457     Clinical Impression Statement Patient feels she is progressing as she is sleeping better.  She did well today wiht the addition of gentle right femoral traction and belt mobs to right hip to affect her right SIJ.  No pain reported after treatment.    Personal Factors and Comorbidities Comorbidity 1;Other    Comorbidities Lumbar  laminectomy, OA, HTN.    Examination-Activity Limitations Other;Stand;Locomotion Level    Examination-Participation Restrictions Other    Rehab Potential Excellent    PT Frequency 2x / week    PT Duration 6 weeks    PT Treatment/Interventions ADLs/Self Care Home Management;Cryotherapy;Electrical Stimulation;Ultrasound;Moist Heat;Therapeutic activities;Therapeutic exercise;Manual techniques;Patient/family education;Passive range of motion;Dry needling    PT Next Visit Plan Combo e'stim/US, STW/M, flexion-biased exercises.    Consulted and Agree with Plan of Care Patient             Patient will benefit from skilled therapeutic intervention in order to improve the following deficits and impairments:  Decreased activity tolerance, Decreased range of motion, Pain  Visit Diagnosis: Acute right-sided low back pain without sciatica     Problem List Patient Active Problem List   Diagnosis Date Noted   Sciatic leg pain    Vitamin D deficiency 02/06/2016   Generalized edema 07/28/2015   GERD 11/08/2010   MIXED HYPERLIPIDEMIA 12/12/2008   Essential hypertension, benign 12/12/2008   Morbid obesity ([place]) 12/12/2008    Arushi Partridge, [name], PT 08/27/2021, 3:00 PM  [facility] [address] [place], [place], [address] Phone: [phone]   Fax:  [phone]  Name: Sarah Benton MRN: [id number] Date of Birth: Oct 30, 1945

## 2021-08-29 ENCOUNTER — Ambulatory Visit: Payer: Medicare PPO | Admitting: Physical Therapy

## 2021-08-29 ENCOUNTER — Other Ambulatory Visit: Payer: Self-pay

## 2021-08-29 DIAGNOSIS — M545 Low back pain, unspecified: Secondary | ICD-10-CM

## 2021-08-29 DIAGNOSIS — R29898 Other symptoms and signs involving the musculoskeletal system: Secondary | ICD-10-CM | POA: Diagnosis not present

## 2021-08-29 NOTE — Therapy (Signed)
[facility] [place], [place], [address] Phone: [phone]   Fax:  [phone]  Physical Therapy Treatment  Patient Details  Name: Sarah Benton MRN: [id number] Date of Birth: 10-10-1945 Referring Provider (PT): Susa Day MD   Encounter Date: 08/29/2021   PT End of Session - 08/29/21 1246     Visit Number 7    Number of Visits 12    Date for PT Re-Evaluation 09/18/21    Authorization Type FOTO AT LEAST EVERY 5TH VISIT.  PROGRESS NOTE AT 10TH VISIT.  KX MODIFIER AFTER 15 VISITS.    PT Start Time 1115    PT Stop Time 1212    PT Time Calculation (min) 57 min    Activity Tolerance Patient tolerated treatment well    Behavior During Therapy WFL for tasks assessed/performed             Past Medical History:  Diagnosis Date   Anemia    Iron def   Arthritis    Cataract    Edema    Esophageal stricture    GERD (gastroesophageal reflux disease)    Hyperlipidemia    Hypertension    Obesity    Ruptured lumbar disc     Past Surgical History:  Procedure Laterality Date   ABDOMINAL HYSTERECTOMY Bilateral 2001   complete   BREAST BIOPSY  1993   Left/negative   BREAST CYST EXCISION Left    LUMBAR LAMINECTOMY  1998   ruptured disc   TONSILLECTOMY     TUBAL LIGATION  1979    There were no vitals filed for this visit.   Subjective Assessment - 08/29/21 1247     Subjective COVID-19 screen performed prior to patient entering clinic.  Patient states she is better since strating PT and is no longer getting shooting pain down her leg but she is still unable to stand for long.    Pertinent History Lumbar laminectomy, OA, HTN.    How long can you stand comfortably? Less than 10 minutes.    How long can you walk comfortably? Less than 10 minutes.    Patient Stated Goals Reduce pain and walking/stand/perform ADL's.    Pain Location Back    Pain Orientation Right    Pain Descriptors / Indicators Aching    Pain  Onset More than a month ago                               [facility] Adult PT Treatment/Exercise - 08/29/21 0001       Exercises   Exercises Knee/Hip      Lumbar Exercises: Aerobic   Nustep Level 5 x 10 minutes.      Lumbar Exercises: Machines for Strengthening   Cybex Knee Flexion 30# x 3 minutes.      Modalities   Modalities Electrical Stimulation;Moist Heat      Moist Heat Therapy   Number Minutes Moist Heat 20 Minutes    Moist Heat Location Lumbar Spine      Electrical Stimulation   Electrical Stimulation Location RT SIJ    Electrical Stimulation Action Pre-mod.    Electrical Stimulation Parameters 80-150 Hz. x 20 minutes.    Electrical Stimulation Goals Tone;Pain      Manual Therapy   Manual Therapy Passive ROM    Soft tissue mobilization Seated in spinal flexion with arms resting on plinth on pillows:  STW/M x 5 minutes to patient's right  SIJ region.    Passive ROM In supine:  Prolonged manual right SKTC stretch and Piriformis-type stretching (6 minutes.)                          PT Long Term Goals - 08/07/21 1247       PT LONG TERM GOAL #1   Title Independent with a HEP.    Time 6    Period Weeks    Status New      PT LONG TERM GOAL #2   Title Stand 20 minutes with pain not > 3/10.    Time 6    Period Weeks    Status New      PT LONG TERM GOAL #3   Title Walk a community distance with pain not > 3/10.    Time 6    Period Weeks    Status New      PT LONG TERM GOAL #4   Title Perform ADL's with pain not > 3-4/10.    Time 6    Period Weeks    Status New                   Plan - 08/29/21 1252     Clinical Impression Statement Patient did well with treatment today that included the addition of the seated hamstring curl exercise today.  She was quite tender over her right iliolumbar ligament.  Provided her with information on obtaining a TENS unit.    Personal Factors and Comorbidities Comorbidity 1;Other     Comorbidities Lumbar laminectomy, OA, HTN.    Examination-Activity Limitations Other;Stand;Locomotion Level    Examination-Participation Restrictions Other    Stability/Clinical Decision Making Stable/Uncomplicated    Rehab Potential Excellent    PT Frequency 2x / week    PT Duration 6 weeks    PT Treatment/Interventions ADLs/Self Care Home Management;Cryotherapy;Electrical Stimulation;Ultrasound;Moist Heat;Therapeutic activities;Therapeutic exercise;Manual techniques;Patient/family education;Passive range of motion;Dry needling    PT Next Visit Plan Combo e'stim/US, STW/M, flexion-biased exercises.    Consulted and Agree with Plan of Care Patient             Patient will benefit from skilled therapeutic intervention in order to improve the following deficits and impairments:  Decreased activity tolerance, Decreased range of motion, Pain  Visit Diagnosis: Acute right-sided low back pain without sciatica     Problem List Patient Active Problem List   Diagnosis Date Noted   Sciatic leg pain    Vitamin D deficiency 02/06/2016   Generalized edema 07/28/2015   GERD 11/08/2010   MIXED HYPERLIPIDEMIA 12/12/2008   Essential hypertension, benign 12/12/2008   Morbid obesity ([place]) 12/12/2008    Araminta Zorn, [name], PT 08/29/2021, 12:54 PM  [facility] [address] [place], [place], [address] Phone: [phone]   Fax:  [phone]  Name: Sarah Benton MRN: [id number] Date of Birth: [birth year]

## 2021-09-02 DIAGNOSIS — R112 Nausea with vomiting, unspecified: Secondary | ICD-10-CM | POA: Diagnosis not present

## 2021-09-02 DIAGNOSIS — R42 Dizziness and giddiness: Secondary | ICD-10-CM | POA: Diagnosis not present

## 2021-09-06 ENCOUNTER — Ambulatory Visit: Payer: Medicare PPO

## 2021-09-06 ENCOUNTER — Other Ambulatory Visit: Payer: Self-pay

## 2021-09-06 DIAGNOSIS — M545 Low back pain, unspecified: Secondary | ICD-10-CM | POA: Diagnosis not present

## 2021-09-06 DIAGNOSIS — R29898 Other symptoms and signs involving the musculoskeletal system: Secondary | ICD-10-CM | POA: Diagnosis not present

## 2021-09-06 NOTE — Therapy (Signed)
[facility] [place], [place], [address] Phone: [phone]   Fax:  [phone]  Physical Therapy Treatment  Patient Details  Name: Sarah Benton MRN: [id number] Date of Birth: Apr 16, 1946 Referring Provider (PT): Susa Day MD   Encounter Date: 09/06/2021   PT End of Session - 09/06/21 1342     Visit Number 8    Number of Visits 12    Date for PT Re-Evaluation 09/18/21    Authorization Type FOTO AT LEAST EVERY 5TH VISIT.  PROGRESS NOTE AT 10TH VISIT.  KX MODIFIER AFTER 15 VISITS.    PT Start Time 2993    PT Stop Time 1430    PT Time Calculation (min) 45 min    Activity Tolerance Patient tolerated treatment well    Behavior During Therapy WFL for tasks assessed/performed             Past Medical History:  Diagnosis Date   Anemia    Iron def   Arthritis    Cataract    Edema    Esophageal stricture    GERD (gastroesophageal reflux disease)    Hyperlipidemia    Hypertension    Obesity    Ruptured lumbar disc     Past Surgical History:  Procedure Laterality Date   ABDOMINAL HYSTERECTOMY Bilateral 2001   complete   BREAST BIOPSY  1993   Left/negative   BREAST CYST EXCISION Left    LUMBAR LAMINECTOMY  1998   ruptured disc   TONSILLECTOMY     TUBAL LIGATION  1979    There were no vitals filed for this visit.   Subjective Assessment - 09/06/21 1342     Subjective COVID-19 screen performed prior to patient entering clinic.  Pt reports 1/10 right low back pain while sitting and 5/10 right low back pain with standing.  Pt states that she is dealing with some vertigo today.    Pertinent History Lumbar laminectomy, OA, HTN.    How long can you stand comfortably? Less than 10 minutes.    How long can you walk comfortably? Less than 10 minutes.    Patient Stated Goals Reduce pain and walking/stand/perform ADL's.    Currently in Pain? Yes    Pain Score 5     Pain Location Back    Pain Orientation  Right    Pain Onset More than a month ago                               [facility] Adult PT Treatment/Exercise - 09/06/21 0001       Lumbar Exercises: Aerobic   Nustep Lvl 5 x 15 mins      Lumbar Exercises: Machines for Strengthening   Cybex Lumbar Extension 50# x 3 minutes.    Cybex Knee Extension 10# x 30 reps    Cybex Knee Flexion 30# x 30 reps      Knee/Hip Exercises: Stretches   Active Hamstring Stretch 4 reps;30 seconds   Seated due to vertigo   Piriformis Stretch 4 reps;30 seconds   Seated due to vertigo     Modalities   Modalities Electrical Stimulation;Moist Heat      Moist Heat Therapy   Number Minutes Moist Heat 15 Minutes    Moist Heat Location Lumbar Spine      Electrical Stimulation   Electrical Stimulation Location Right low back    Electrical Stimulation Action IFC    Electrical  Stimulation Parameters 80-150 Hz x 15 mins    Electrical Stimulation Goals Tone;Pain                          PT Long Term Goals - 08/07/21 1247       PT LONG TERM GOAL #1   Title Independent with a HEP.    Time 6    Period Weeks    Status New      PT LONG TERM GOAL #2   Title Stand 20 minutes with pain not > 3/10.    Time 6    Period Weeks    Status New      PT LONG TERM GOAL #3   Title Walk a community distance with pain not > 3/10.    Time 6    Period Weeks    Status New      PT LONG TERM GOAL #4   Title Perform ADL's with pain not > 3-4/10.    Time 6    Period Weeks    Status New                   Plan - 09/06/21 1343     Clinical Impression Statement Pt arrives for today's treatment reporting 5/10 right low back pain with standing and 1/10 right low back  with sitting.  Pt also dealing with vertigo today.  Stretches and modalities performed in seated position to accomodate for vertigo.  Normal responses to estim and moist heat noted.  Pt reported 0/10 right low back pain at completion of today's treatment.     Personal Factors and Comorbidities Comorbidity 1;Other    Comorbidities Lumbar laminectomy, OA, HTN.    Examination-Activity Limitations Other;Stand;Locomotion Level    Examination-Participation Restrictions Other    Stability/Clinical Decision Making Stable/Uncomplicated    Rehab Potential Excellent    PT Frequency 2x / week    PT Duration 6 weeks    PT Treatment/Interventions ADLs/Self Care Home Management;Cryotherapy;Electrical Stimulation;Ultrasound;Moist Heat;Therapeutic activities;Therapeutic exercise;Manual techniques;Patient/family education;Passive range of motion;Dry needling    PT Next Visit Plan Combo e'stim/US, STW/M, flexion-biased exercises.    Consulted and Agree with Plan of Care Patient             Patient will benefit from skilled therapeutic intervention in order to improve the following deficits and impairments:  Decreased activity tolerance, Decreased range of motion, Pain  Visit Diagnosis: Acute right-sided low back pain without sciatica     Problem List Patient Active Problem List   Diagnosis Date Noted   Sciatic leg pain    Vitamin D deficiency 02/06/2016   Generalized edema 07/28/2015   GERD 11/08/2010   MIXED HYPERLIPIDEMIA 12/12/2008   Essential hypertension, benign 12/12/2008   Morbid obesity ([place]) 12/12/2008    Sarah Benton, Sarah Benton 09/06/2021, 2:39 PM  [facility] [address] [place], [place], [address] Phone: [phone]   Fax:  [phone]  Name: Sarah Benton MRN: [id number] Date of Birth: 24-Mar-1946

## 2021-09-07 ENCOUNTER — Other Ambulatory Visit: Payer: Self-pay | Admitting: Nurse Practitioner

## 2021-09-26 ENCOUNTER — Other Ambulatory Visit: Payer: Self-pay | Admitting: Obstetrics and Gynecology

## 2021-09-26 DIAGNOSIS — Z1231 Encounter for screening mammogram for malignant neoplasm of breast: Secondary | ICD-10-CM

## 2021-09-27 ENCOUNTER — Other Ambulatory Visit: Payer: Self-pay

## 2021-09-27 ENCOUNTER — Ambulatory Visit: Payer: Medicare PPO | Attending: Specialist

## 2021-09-27 DIAGNOSIS — M545 Low back pain, unspecified: Secondary | ICD-10-CM | POA: Insufficient documentation

## 2021-09-27 DIAGNOSIS — M6281 Muscle weakness (generalized): Secondary | ICD-10-CM | POA: Diagnosis not present

## 2021-09-27 NOTE — Therapy (Signed)
[facility] [place], [place], [address] Phone: [phone]   Fax:  [phone]  Physical Therapy Treatment  Patient Details  Name: Sarah Benton MRN: [id number] Date of Birth: Sep 07, 1946 Referring Provider (PT): Susa Day MD   Encounter Date: 09/27/2021   PT End of Session - 09/27/21 1349     Visit Number 9    Number of Visits 12    Date for PT Re-Evaluation 09/18/21    Authorization Type FOTO AT LEAST EVERY 5TH VISIT.  PROGRESS NOTE AT 10TH VISIT.  KX MODIFIER AFTER 15 VISITS.    PT Start Time 8889    PT Stop Time 1430    PT Time Calculation (min) 45 min    Activity Tolerance Patient tolerated treatment well    Behavior During Therapy WFL for tasks assessed/performed             Past Medical History:  Diagnosis Date   Anemia    Iron def   Arthritis    Cataract    Edema    Esophageal stricture    GERD (gastroesophageal reflux disease)    Hyperlipidemia    Hypertension    Obesity    Ruptured lumbar disc     Past Surgical History:  Procedure Laterality Date   ABDOMINAL HYSTERECTOMY Bilateral 2001   complete   BREAST BIOPSY  1993   Left/negative   BREAST CYST EXCISION Left    LUMBAR LAMINECTOMY  1998   ruptured disc   TONSILLECTOMY     TUBAL LIGATION  1979    There were no vitals filed for this visit.   Subjective Assessment - 09/27/21 1347     Subjective COVID-19 screen performed prior to patient entering clinic.  Pt arrives for today's treatment session reporting 4/10 right low back pain.  Pt states that her back did not give her any major issues over the holidays.    Pertinent History Lumbar laminectomy, OA, HTN.    How long can you stand comfortably? Less than 10 minutes.    How long can you walk comfortably? Less than 10 minutes.    Patient Stated Goals Reduce pain and walking/stand/perform ADL's.    Currently in Pain? Yes    Pain Score 4     Pain Location Back    Pain Orientation  Right    Pain Onset More than a month ago                               [facility] Adult PT Treatment/Exercise - 09/27/21 0001       Lumbar Exercises: Aerobic   Nustep Lvl 5 x 15 mins      Lumbar Exercises: Machines for Strengthening   Cybex Lumbar Extension 50# x 3 minutes.    Cybex Knee Extension 10# x 30 reps    Cybex Knee Flexion 30# x 30 reps      Knee/Hip Exercises: Stretches   Active Hamstring Stretch 4 reps;30 seconds   Seated   Piriformis Stretch 4 reps;30 seconds   Seated     Modalities   Modalities Electrical Stimulation;Moist Heat      Moist Heat Therapy   Number Minutes Moist Heat 15 Minutes    Moist Heat Location Lumbar Spine      Electrical Stimulation   Electrical Stimulation Location Right low back    Electrical Stimulation Action IFC    Electrical Stimulation Parameters 80-150 Hz x 15  mins    Electrical Stimulation Goals Tone;Pain                          PT Long Term Goals - 08/07/21 1247       PT LONG TERM GOAL #1   Title Independent with a HEP.    Time 6    Period Weeks    Status New      PT LONG TERM GOAL #2   Title Stand 20 minutes with pain not > 3/10.    Time 6    Period Weeks    Status New      PT LONG TERM GOAL #3   Title Walk a community distance with pain not > 3/10.    Time 6    Period Weeks    Status New      PT LONG TERM GOAL #4   Title Perform ADL's with pain not > 3-4/10.    Time 6    Period Weeks    Status New                   Plan - 09/27/21 1349     Clinical Impression Statement Pt arrives for today's treatment reporting 5/10 right low back pain. Pt states that her back did not cause her any major issues over the holidays.  Pt requiring min cues for eccentric control with cybex exercises.  Pt perferred to perform stretches while seated due to previous case of vertigo and wishing to not cause another one.  Normal responses to estim and moist heat noted. Pt reports 3/10 right  low back pain at completion of today's treatment.    Personal Factors and Comorbidities Comorbidity 1;Other    Comorbidities Lumbar laminectomy, OA, HTN.    Examination-Activity Limitations Other;Stand;Locomotion Level    Examination-Participation Restrictions Other    Stability/Clinical Decision Making Stable/Uncomplicated    Rehab Potential Excellent    PT Frequency 2x / week    PT Duration 6 weeks    PT Treatment/Interventions ADLs/Self Care Home Management;Cryotherapy;Electrical Stimulation;Ultrasound;Moist Heat;Therapeutic activities;Therapeutic exercise;Manual techniques;Patient/family education;Passive range of motion;Dry needling    PT Next Visit Plan Combo e'stim/US, STW/M, flexion-biased exercises.    Consulted and Agree with Plan of Care Patient             Patient will benefit from skilled therapeutic intervention in order to improve the following deficits and impairments:  Decreased activity tolerance, Decreased range of motion, Pain  Visit Diagnosis: Acute right-sided low back pain without sciatica     Problem List Patient Active Problem List   Diagnosis Date Noted   Sciatic leg pain    Vitamin D deficiency 02/06/2016   Generalized edema 07/28/2015   GERD 11/08/2010   MIXED HYPERLIPIDEMIA 12/12/2008   Essential hypertension, benign 12/12/2008   Morbid obesity ([place]) 12/12/2008    Kathrynn Ducking, PTA 09/27/2021, 2:33 PM  [facility] [place], [place], [address] Phone: [phone]   Fax:  [phone]  Name: Sarah Benton MRN: [id number] Date of Birth: 1946-07-18

## 2021-10-01 ENCOUNTER — Ambulatory Visit: Payer: Medicare PPO | Admitting: Physical Therapy

## 2021-10-01 ENCOUNTER — Other Ambulatory Visit: Payer: Self-pay

## 2021-10-01 DIAGNOSIS — M545 Low back pain, unspecified: Secondary | ICD-10-CM

## 2021-10-01 DIAGNOSIS — M6281 Muscle weakness (generalized): Secondary | ICD-10-CM | POA: Diagnosis not present

## 2021-10-01 NOTE — Therapy (Signed)
[facility] [place], [place], [address] Phone: [phone]   Fax:  [phone]  Physical Therapy Treatment  Patient Details  Name: Sarah Benton MRN: [id number] Date of Birth: [birth year] Referring Provider (PT): Susa Day MD   Encounter Date: 10/01/2021   PT End of Session - 10/01/21 1444     Visit Number 10    Number of Visits 12    Date for PT Re-Evaluation 10/09/21    Authorization Type FOTO AT LEAST EVERY 5TH VISIT.  PROGRESS NOTE AT 10TH VISIT.  KX MODIFIER AFTER 15 VISITS.    PT Start Time 0148    PT Stop Time 0230    PT Time Calculation (min) 42 min    Activity Tolerance Patient tolerated treatment well    Behavior During Therapy WFL for tasks assessed/performed             Past Medical History:  Diagnosis Date   Anemia    Iron def   Arthritis    Cataract    Edema    Esophageal stricture    GERD (gastroesophageal reflux disease)    Hyperlipidemia    Hypertension    Obesity    Ruptured lumbar disc     Past Surgical History:  Procedure Laterality Date   ABDOMINAL HYSTERECTOMY Bilateral 2001   complete   BREAST BIOPSY  1993   Left/negative   BREAST CYST EXCISION Left    LUMBAR LAMINECTOMY  1998   ruptured disc   TONSILLECTOMY     TUBAL LIGATION  1979    There were no vitals filed for this visit.   Subjective Assessment - 10/01/21 1440     Subjective COVID-19 screen performed prior to patient entering clinic.  Patient states it feels like "bone on bone" sometimes, pointing to her right SIJ.    Pertinent History Lumbar laminectomy, OA, HTN.    How long can you stand comfortably? Less than 10 minutes.    How long can you walk comfortably? Less than 10 minutes.    Currently in Pain? Yes    Pain Score 4     Pain Location Back    Pain Orientation Right    Pain Descriptors / Indicators --   "bone and bone".   Pain Type Acute pain    Pain Onset More than a month ago                                [facility] Adult PT Treatment/Exercise - 10/01/21 0001       Exercises   Exercises Knee/Hip      Lumbar Exercises: Aerobic   Nustep Level 5 x 15 minutes.      Lumbar Exercises: Machines for Strengthening   Cybex Lumbar Extension 50# x 3 minutes.      Moist Heat Therapy   Number Minutes Moist Heat 10 Minutes    Moist Heat Location Lumbar Spine      Electrical Stimulation   Electrical Stimulation Location Right SIJ region.    Electrical Stimulation Action IFC at 80-150 Hz. x 10 minutes.    Electrical Stimulation Goals Tone;Pain      Manual Therapy   Manual Therapy Soft tissue mobilization    Soft tissue mobilization In left sdly position with folded pillow between knees for comfort:  STW/M x 7 minutes to patient's right SIJ region.  PT Long Term Goals - 10/01/21 1450       PT LONG TERM GOAL #1   Title Independent with a HEP.    Time 6    Period Weeks    Status Achieved      PT LONG TERM GOAL #2   Title Stand 20 minutes with pain not > 3/10.    Time 6    Period Weeks    Status On-going      PT LONG TERM GOAL #3   Title Walk a community distance with pain not > 3/10.    Time 6    Period Weeks    Status On-going      PT LONG TERM GOAL #4   Title Perform ADL's with pain not > 3-4/10.    Time 6    Period Weeks    Status On-going                   Plan - 10/01/21 1448     Clinical Impression Statement See 'Therapy Note" section.    Personal Factors and Comorbidities Comorbidity 1;Other    Examination-Activity Limitations Other;Stand;Locomotion Level    Examination-Participation Restrictions Other    Stability/Clinical Decision Making Stable/Uncomplicated    Rehab Potential Excellent    PT Frequency 2x / week    PT Duration 6 weeks    PT Treatment/Interventions ADLs/Self Care Home Management;Cryotherapy;Electrical Stimulation;Ultrasound;Moist Heat;Therapeutic  activities;Therapeutic exercise;Manual techniques;Patient/family education;Passive range of motion;Dry needling    PT Next Visit Plan Core exercise progression.    Consulted and Agree with Plan of Care Patient             Patient will benefit from skilled therapeutic intervention in order to improve the following deficits and impairments:  Decreased activity tolerance, Decreased range of motion, Pain  Visit Diagnosis: Acute right-sided low back pain without sciatica     Problem List Patient Active Problem List   Diagnosis Date Noted   Sciatic leg pain    Vitamin D deficiency 02/06/2016   Generalized edema 07/28/2015   GERD 11/08/2010   MIXED HYPERLIPIDEMIA 12/12/2008   Essential hypertension, benign 12/12/2008   Morbid obesity ([place]) 12/12/2008   Progress Note Reporting Period 08/07/21 to 10/01/21.  See note below for Objective Data and Assessment of Progress/Goals. Patient reports improvement since beginning PT.  Her back felt quite good over the holidays.    Gillian Kluever, [name], PT 10/01/2021, 2:51 PM  [facility] [address] [place], [place], [address] Phone: [phone]   Fax:  [phone]  Name: Sarah Benton MRN: [id number] Date of Birth: 1946/01/09

## 2021-10-02 NOTE — Addendum Note (Signed)
Addended by: Keairra Bardon, [name] W on: 10/02/2021 09:36 AM   Modules accepted: Orders

## 2021-10-03 ENCOUNTER — Ambulatory Visit: Payer: Medicare PPO | Admitting: Physical Therapy

## 2021-10-03 ENCOUNTER — Other Ambulatory Visit: Payer: Self-pay

## 2021-10-03 ENCOUNTER — Encounter: Payer: Self-pay | Admitting: Physical Therapy

## 2021-10-03 DIAGNOSIS — M545 Low back pain, unspecified: Secondary | ICD-10-CM

## 2021-10-03 DIAGNOSIS — M6281 Muscle weakness (generalized): Secondary | ICD-10-CM | POA: Diagnosis not present

## 2021-10-03 NOTE — Therapy (Signed)
[facility] [place], [place], [address] Phone: [phone]   Fax:  [phone]  Physical Therapy Treatment  Patient Details  Name: Sarah Benton MRN: [id number] Date of Birth: [birth year] Referring Provider (PT): Susa Day MD   Encounter Date: 10/03/2021   PT End of Session - 10/03/21 1511     Visit Number 11    Number of Visits 12    Date for PT Re-Evaluation 10/09/21    Authorization Type FOTO AT LEAST EVERY 5TH VISIT.  PROGRESS NOTE AT 10TH VISIT.  KX MODIFIER AFTER 15 VISITS.    PT Start Time 0101    PT Stop Time 0157    PT Time Calculation (min) 56 min    Activity Tolerance Patient tolerated treatment well    Behavior During Therapy WFL for tasks assessed/performed             Past Medical History:  Diagnosis Date   Anemia    Iron def   Arthritis    Cataract    Edema    Esophageal stricture    GERD (gastroesophageal reflux disease)    Hyperlipidemia    Hypertension    Obesity    Ruptured lumbar disc     Past Surgical History:  Procedure Laterality Date   ABDOMINAL HYSTERECTOMY Bilateral 2001   complete   BREAST BIOPSY  1993   Left/negative   BREAST CYST EXCISION Left    LUMBAR LAMINECTOMY  1998   ruptured disc   TONSILLECTOMY     TUBAL LIGATION  1979    There were no vitals filed for this visit.   Subjective Assessment - 10/03/21 1512     Subjective COVID-19 screen performed prior to patient entering clinic.  Felt good after last treatment.    Pertinent History Lumbar laminectomy, OA, HTN.    How long can you stand comfortably? Less than 10 minutes.    How long can you walk comfortably? Less than 10 minutes.    Patient Stated Goals Reduce pain and walking/stand/perform ADL's.    Currently in Pain? Yes    Pain Score 4     Pain Location Back    Pain Orientation Right    Pain Type Acute pain    Pain Onset More than a month ago                                [facility] Adult PT Treatment/Exercise - 10/03/21 0001       Exercises   Exercises Knee/Hip      Lumbar Exercises: Aerobic   Nustep Level 3 x 15 minutes.      Modalities   Modalities Electrical Stimulation;Moist Heat      Moist Heat Therapy   Number Minutes Moist Heat 20 Minutes    Moist Heat Location Lumbar Spine      Electrical Stimulation   Electrical Stimulation Location RT SIJ region.    Electrical Stimulation Action IFC at 80-150 Hz.    Electrical Stimulation Parameters 40% scan x 20 minutes.    Electrical Stimulation Goals Tone;Pain      Manual Therapy   Manual Therapy Soft tissue mobilization    Soft tissue mobilization STW/M x 10 minutes to patient's left SIJ region.                          PT Long Term Goals - 10/01/21 1450  PT LONG TERM GOAL #1   Title Independent with a HEP.    Time 6    Period Weeks    Status Achieved      PT LONG TERM GOAL #2   Title Stand 20 minutes with pain not > 3/10.    Time 6    Period Weeks    Status On-going      PT LONG TERM GOAL #3   Title Walk a community distance with pain not > 3/10.    Time 6    Period Weeks    Status On-going      PT LONG TERM GOAL #4   Title Perform ADL's with pain not > 3-4/10.    Time 6    Period Weeks    Status On-going                   Plan - 10/03/21 1515     Clinical Impression Statement Patient did well with treatment.  Her pain was very localized over her right SIJ, specifically the posterior sacral-iliac ligament.    Personal Factors and Comorbidities Comorbidity 1;Other    Comorbidities Lumbar laminectomy, OA, HTN.    Examination-Activity Limitations Other;Stand;Locomotion Level    Examination-Participation Restrictions Other    Stability/Clinical Decision Making Stable/Uncomplicated    Rehab Potential Excellent    PT Frequency 2x / week    PT Duration 6 weeks    PT Treatment/Interventions ADLs/Self Care Home  Management;Cryotherapy;Electrical Stimulation;Ultrasound;Moist Heat;Therapeutic activities;Therapeutic exercise;Manual techniques;Patient/family education;Passive range of motion;Dry needling    PT Next Visit Plan Core exercise progression.    Consulted and Agree with Plan of Care Patient             Patient will benefit from skilled therapeutic intervention in order to improve the following deficits and impairments:  Decreased activity tolerance, Decreased range of motion, Pain  Visit Diagnosis: Acute right-sided low back pain without sciatica     Problem List Patient Active Problem List   Diagnosis Date Noted   Sciatic leg pain    Vitamin D deficiency 02/06/2016   Generalized edema 07/28/2015   GERD 11/08/2010   MIXED HYPERLIPIDEMIA 12/12/2008   Essential hypertension, benign 12/12/2008   Morbid obesity ([place]) 12/12/2008    Stclair Szymborski, [name], PT 10/03/2021, 3:17 PM  [facility] [address] [place], [place], [address] Phone: [phone]   Fax:  [phone]  Name: Sarah Benton MRN: [id number] Date of Birth: October 07, 1945

## 2021-10-04 ENCOUNTER — Ambulatory Visit: Payer: Medicare PPO | Admitting: Physical Therapy

## 2021-10-04 DIAGNOSIS — M545 Low back pain, unspecified: Secondary | ICD-10-CM

## 2021-10-04 DIAGNOSIS — M6281 Muscle weakness (generalized): Secondary | ICD-10-CM | POA: Diagnosis not present

## 2021-10-04 NOTE — Therapy (Signed)
[facility] [place], [place], [address] Phone: [phone]   Fax:  [phone]  Physical Therapy Treatment  Patient Details  Name: Sarah Benton MRN: [id number] Date of Birth: 27-Mar-1946 Referring Provider (PT): Susa Day MD   Encounter Date: 10/04/2021   PT End of Session - 10/04/21 1442     Visit Number 12    Number of Visits 12    Date for PT Re-Evaluation 10/09/21    Authorization Type FOTO AT LEAST EVERY 5TH VISIT.  PROGRESS NOTE AT 10TH VISIT.  KX MODIFIER AFTER 15 VISITS.    PT Start Time 0145    PT Stop Time 0229    PT Time Calculation (min) 44 min    Activity Tolerance Patient tolerated treatment well    Behavior During Therapy WFL for tasks assessed/performed             Past Medical History:  Diagnosis Date   Anemia    Iron def   Arthritis    Cataract    Edema    Esophageal stricture    GERD (gastroesophageal reflux disease)    Hyperlipidemia    Hypertension    Obesity    Ruptured lumbar disc     Past Surgical History:  Procedure Laterality Date   ABDOMINAL HYSTERECTOMY Bilateral 2001   complete   BREAST BIOPSY  1993   Left/negative   BREAST CYST EXCISION Left    LUMBAR LAMINECTOMY  1998   ruptured disc   TONSILLECTOMY     TUBAL LIGATION  1979    There were no vitals filed for this visit.   Subjective Assessment - 10/04/21 1426     Subjective COVID-19 screen performed prior to patient entering clinic.  See discharge sumary.    Pertinent History Lumbar laminectomy, OA, HTN.    How long can you stand comfortably? Less than 10 minutes.    How long can you walk comfortably? Less than 10 minutes.    Patient Stated Goals Reduce pain and walking/stand/perform ADL's.    Currently in Pain? Yes    Pain Score 3     Pain Location Back    Pain Orientation Right    Pain Descriptors / Indicators Burning    Pain Onset More than a month ago                                [facility] Adult PT Treatment/Exercise - 10/04/21 0001       Exercises   Exercises Knee/Hip      Lumbar Exercises: Aerobic   Nustep Level 3 x 15 minutes.      Moist Heat Therapy   Number Minutes Moist Heat 10 Minutes    Moist Heat Location Lumbar Spine      Electrical Stimulation   Electrical Stimulation Location RT SIJ region.    Electrical Stimulation Action Pre-mod.    Electrical Stimulation Parameters 80-150 Hz. x 10 minutes.    Electrical Stimulation Goals Tone;Pain      Manual Therapy   Manual Therapy Soft tissue mobilization    Soft tissue mobilization STW/M x 8 minutes to right low back/SIJ region.                          PT Long Term Goals - 10/04/21 1423       PT LONG TERM GOAL #1   Title Independent with a  HEP.    Time 6    Period Weeks    Status Achieved      PT LONG TERM GOAL #2   Title Stand 20 minutes with pain not > 3/10.    Time 6    Period Weeks    Status Partially Met      PT LONG TERM GOAL #3   Title Walk a community distance with pain not > 3/10.    Time 6    Period Weeks    Status Not Met      PT LONG TERM GOAL #4   Title Perform ADL's with pain not > 3-4/10.    Time 6    Period Weeks    Status Partially Met                   Plan - 10/04/21 1441     Clinical Impression Statement See "Discharge Summary".    Personal Factors and Comorbidities Comorbidity 1;Other    Comorbidities Lumbar laminectomy, OA, HTN.    Examination-Activity Limitations Other;Stand;Locomotion Level    Examination-Participation Restrictions Other             Patient will benefit from skilled therapeutic intervention in order to improve the following deficits and impairments:  Decreased activity tolerance, Decreased range of motion, Pain  Visit Diagnosis: Acute right-sided low back pain without sciatica     Problem List Patient Active Problem List   Diagnosis Date Noted   Sciatic leg  pain    Vitamin D deficiency 02/06/2016   Generalized edema 07/28/2015   GERD 11/08/2010   MIXED HYPERLIPIDEMIA 12/12/2008   Essential hypertension, benign 12/12/2008   Morbid obesity ([place]) 12/12/2008  PHYSICAL THERAPY DISCHARGE SUMMARY  Visits from Start of Care: 12.  Current functional level related to goals / functional outcomes: See above.   Remaining deficits: Patient's FOTO score did demonstrate significant improvement though she continues to have pain in the region of her right SIJ which limits her functionally.   Education / Equipment: HEP.   Patient agrees to discharge. Patient goals were partially met. Patient is being discharged due to lack of progress.  [name], [name], PT 10/04/2021, 2:43 PM  [facility] [address] [place], [place], [address] Phone: [phone]   Fax:  [phone]  Name: Sarah Benton MRN: [id number] Date of Birth: April 13, 1946

## 2021-10-30 ENCOUNTER — Ambulatory Visit
Admission: RE | Admit: 2021-10-30 | Discharge: 2021-10-30 | Disposition: A | Discharge status: Home / Self Care | Payer: Medicare PPO | Source: Ambulatory Visit | Attending: Obstetrics and Gynecology | Admitting: Obstetrics and Gynecology

## 2021-10-30 ENCOUNTER — Other Ambulatory Visit: Payer: Self-pay

## 2021-10-30 DIAGNOSIS — Z1231 Encounter for screening mammogram for malignant neoplasm of breast: Secondary | ICD-10-CM

## 2021-12-01 DIAGNOSIS — M5451 Vertebrogenic low back pain: Secondary | ICD-10-CM | POA: Diagnosis not present

## 2021-12-10 DIAGNOSIS — M5451 Vertebrogenic low back pain: Secondary | ICD-10-CM | POA: Diagnosis not present

## 2021-12-18 ENCOUNTER — Ambulatory Visit: Payer: Medicare PPO | Admitting: Nurse Practitioner

## 2021-12-18 ENCOUNTER — Telehealth: Payer: Self-pay | Admitting: Nurse Practitioner

## 2021-12-18 ENCOUNTER — Encounter: Payer: Self-pay | Admitting: Nurse Practitioner

## 2021-12-18 VITALS — BP 104/65 | HR 86 | Temp 98.2°F | Resp 20 | Ht 62.0 in | Wt 208.0 lb

## 2021-12-18 DIAGNOSIS — K219 Gastro-esophageal reflux disease without esophagitis: Secondary | ICD-10-CM | POA: Diagnosis not present

## 2021-12-18 DIAGNOSIS — I10 Essential (primary) hypertension: Secondary | ICD-10-CM | POA: Diagnosis not present

## 2021-12-18 DIAGNOSIS — Z23 Encounter for immunization: Secondary | ICD-10-CM

## 2021-12-18 DIAGNOSIS — E559 Vitamin D deficiency, unspecified: Secondary | ICD-10-CM | POA: Diagnosis not present

## 2021-12-18 DIAGNOSIS — R601 Generalized edema: Secondary | ICD-10-CM

## 2021-12-18 DIAGNOSIS — M545 Low back pain, unspecified: Secondary | ICD-10-CM | POA: Diagnosis not present

## 2021-12-18 DIAGNOSIS — E782 Mixed hyperlipidemia: Secondary | ICD-10-CM

## 2021-12-18 MED ORDER — ROSUVASTATIN CALCIUM 20 MG PO TABS: 20.0000 mg | ORAL_TABLET | Freq: Every day | ORAL | 1 refills | Status: DC

## 2021-12-18 MED ORDER — OMEPRAZOLE 20 MG PO CPDR: 20.0000 mg | DELAYED_RELEASE_CAPSULE | Freq: Every day | ORAL | 1 refills | Status: DC

## 2021-12-18 MED ORDER — LISINOPRIL-HYDROCHLOROTHIAZIDE 20-12.5 MG PO TABS: 1.0000 | ORAL_TABLET | Freq: Every day | ORAL | 1 refills | Status: DC

## 2021-12-18 NOTE — Patient Instructions (Signed)
Edema °Edema is an abnormal buildup of fluids in the body tissues and under the skin. Swelling of the legs, feet, and ankles is a common symptom that becomes more likely as you get older. Swelling is also common in looser tissues, like around the eyes. When the affected area is squeezed, the fluid may move out of that spot and leave a dent for a few moments. This dent is called pitting edema. °There are many possible causes of edema. Eating too much salt (sodium) and being on your feet or sitting for a long time can cause edema in your legs, feet, and ankles. Hot weather may make edema worse. Common causes of edema include: °Heart failure. °Liver or kidney disease. °Weak leg blood vessels. °Cancer. °An injury. °Pregnancy. °Medicines. °Being obese. °Low protein levels in the blood. °Edema is usually painless. Your skin may look swollen or shiny. °Follow these instructions at home: °Keep the affected body part raised (elevated) above the level of your heart when you are sitting or lying down. °Do not sit still or stand for long periods of time. °Do not wear tight clothing. Do not wear garters on your upper legs. °Exercise your legs to get your circulation going. This helps to move the fluid back into your blood vessels, and it may help the swelling go down. °Wear elastic bandages or support stockings to reduce swelling as told by your health care provider. °Eat a low-salt (low-sodium) diet to reduce fluid as told by your health care provider. °Depending on the cause of your swelling, you may need to limit how much fluid you drink (fluid restriction). °Take over-the-counter and prescription medicines only as told by your health care provider. °Contact a health care provider if: °Your edema does not get better with treatment. °You have heart, liver, or kidney disease and have symptoms of edema. °You have sudden and unexplained weight gain. °Get help right away if: °You develop shortness of breath or chest pain. °You  cannot breathe when you lie down. °You develop pain, redness, or warmth in the swollen areas. °You have heart, liver, or kidney disease and suddenly get edema. °You have a fever and your symptoms suddenly get worse. °Summary °Edema is an abnormal buildup of fluids in the body tissues and under the skin. °Eating too much salt (sodium) and being on your feet or sitting for a long time can cause edema in your legs, feet, and ankles. °Keep the affected body part raised (elevated) above the level of your heart when you are sitting or lying down. °This information is not intended to replace advice given to you by your health care provider. Make sure you discuss any questions you have with your health care provider. °Document Revised: 02/08/2021 Document Reviewed: 07/04/2020 °Elsevier Patient Education © 2022 [employer] ° °

## 2021-12-18 NOTE — Progress Notes (Signed)
? ?Subjective:  ? ? Patient ID: Sarah Benton, female    DOB: Apr 07, 1946, 76 y.o.   MRN: [id number] ? ? ?Chief Complaint: Medical Management of Chronic Issues ?  ? ?HPI: ? ?Sarah Benton is a 76 y.o. who identifies as a female who was assigned female at birth.  ? ?Social history: ?Lives with: by herself - her family checks on her daily ?Work history: retired from [employer] ? ? ?Comes in today for follow up of the following chronic medical issues: ? ?1. Essential hypertension, benign ?No c/o chest pain, sob or headache. Does not check blood pressure at home. ?BP Readings from Last 3 Encounters:  ?12/18/21 104/65  ?08/21/21 101/68  ?06/19/21 118/62  ? ? ? ?2. Mixed hyperlipidemia ?Does try to watch diet but does no dedicated exercise. ?Lab Results  ?Component Value Date  ? CHOL 171 06/19/2021  ? HDL 54 06/19/2021  ? [place] 91 06/19/2021  ? TRIG 147 06/19/2021  ? CHOLHDL 3.2 06/19/2021  ?The 10-year ASCVD risk score (Arnett DK, et al., 2019) is: 17.7% ? ? ? ?3. Gastroesophageal reflux disease without esophagitis ?Is on omeprazole daily and is doing well. ? ?4. Generalized edema ?Is stressed at times but is on no medications. ? ?  12/18/2021  ?  9:58 AM 06/19/2021  ?  8:57 AM  ?GAD 7 : Generalized Anxiety Score  ?Nervous, Anxious, on Edge 0 0  ?Control/stop worrying 0 0  ?Worry too much - different things 0 0  ?Trouble relaxing 0 0  ?Restless 0 0  ?Easily annoyed or irritable 0 0  ?Afraid - awful might happen 0 0  ?Total GAD 7 Score 0 0  ?Anxiety Difficulty Not difficult at all Not difficult at all  ? ? ? ? ?5. Vitamin D deficiency ?Is on daily vitamin d supplement ? ?6. Morbid obesity ([place]) ?Weight is up 5lbs ?Wt Readings from Last 3 Encounters:  ?12/18/21 208 lb (94.3 kg)  ?08/21/21 213 lb (96.6 kg)  ?07/16/21 212 lb (96.2 kg)  ? ?BMI Readings from Last 3 Encounters:  ?12/18/21 38.04 kg/m?  ?08/21/21 38.96 kg/m?  ?07/16/21 38.78 kg/m?  ? ? ? ? ?New complaints: ?None today ? ?Allergies  ?Allergen Reactions   ? Amoxicillin Diarrhea  ? Crestor [Rosuvastatin Calcium]   ?  Leg cramps ?  ? Septra [Sulfamethoxazole-Trimethoprim]   ?  Nausea   ? Atorvastatin Other (See Comments)  ?  Myalgias / muscle aches ?  ? ?Outpatient Encounter Medications as of 12/18/2021  ?Medication Sig  ? Cholecalciferol (VITAMIN D3) 2000 UNITS capsule Take 2,000 Units by mouth daily.  ? Coenzyme Q10 (CO Q-10 PO) Take by mouth.  ? fluticasone (CUTIVATE) 0.05 % cream APPLY TO THE AFFECTED AREAS UP TO TWICE A DAY AS NEEDED  ? ketoconazole (NIZORAL) 2 % cream APPLY TO THE AFFECTED AREAS UP TO TWICE A DAY AS NEEDED  ? ketoconazole 2%-triamcinolone 0.1% 1:2 cream mixture Apply topically 2 (two) times daily as needed.  ? lisinopril-hydrochlorothiazide (ZESTORETIC) 20-12.5 MG tablet Take 1 tablet by mouth daily.  ? meloxicam (MOBIC) 15 MG tablet TAKE 1 TABLET DAILY  ? omeprazole (PRILOSEC) 20 MG capsule Take 1 capsule (20 mg total) by mouth daily.  ? rosuvastatin (CRESTOR) 20 MG tablet Take 1 tablet (20 mg total) by mouth daily.  ? ?No facility-administered encounter medications on file as of 12/18/2021.  ? ? ?Past Surgical History:  ?Procedure Laterality Date  ? ABDOMINAL HYSTERECTOMY Bilateral 2001  ? complete  ?  BREAST BIOPSY  1993  ? Left/negative  ? BREAST CYST EXCISION Left   ? LUMBAR LAMINECTOMY  1998  ? ruptured disc  ? TONSILLECTOMY    ? TUBAL LIGATION  1979  ? ? ?Family History  ?Problem Relation Age of Onset  ? Parkinsonism Mother 79  ? Ovarian cancer Mother   ? Hypertension Father   ? Prostate cancer Father 66  ?     met to bone / hip  ? Arthritis Sister   ?     knee  ? Diabetes Daughter   ? Diabetes Son   ?     type 2  ? Diabetes Grandchild 17  ?     type 1  ? Colon cancer Neg Hx   ? Breast cancer Neg Hx   ? ? ? ? ?Controlled substance contract: n/a ? ? ? ? ?Review of Systems  ?Constitutional:  Negative for diaphoresis.  ?Eyes:  Negative for pain.  ?Respiratory:  Negative for shortness of breath.   ?Cardiovascular:  Negative for chest pain,  palpitations and leg swelling.  ?Gastrointestinal:  Negative for abdominal pain.  ?Endocrine: Negative for polydipsia.  ?Skin:  Negative for rash.  ?Neurological:  Negative for dizziness, weakness and headaches.  ?Hematological:  Does not bruise/bleed easily.  ?All other systems reviewed and are negative. ? ?   ?Objective:  ? Physical Exam ?Vitals and nursing note reviewed.  ?Constitutional:   ?   General: She is not in acute distress. ?   Appearance: Normal appearance. She is well-developed.  ?HENT:  ?   Head: Normocephalic.  ?   Right Ear: Tympanic membrane normal.  ?   Left Ear: Tympanic membrane normal.  ?   Nose: Nose normal.  ?   Mouth/Throat:  ?   Mouth: Mucous membranes are moist.  ?Eyes:  ?   Pupils: Pupils are equal, round, and reactive to light.  ?Neck:  ?   Vascular: No carotid bruit or JVD.  ?Cardiovascular:  ?   Rate and Rhythm: Normal rate and regular rhythm.  ?   Heart sounds: Normal heart sounds.  ?Pulmonary:  ?   Effort: Pulmonary effort is normal. No respiratory distress.  ?   Breath sounds: Normal breath sounds. No wheezing or rales.  ?Chest:  ?   Chest wall: No tenderness.  ?Abdominal:  ?   General: Bowel sounds are normal. There is no distension or abdominal bruit.  ?   Palpations: Abdomen is soft. There is no hepatomegaly, splenomegaly, mass or pulsatile mass.  ?   Tenderness: There is no abdominal tenderness.  ?Musculoskeletal:     ?   General: Normal range of motion.  ?   Cervical back: Normal range of motion and neck supple.  ?Lymphadenopathy:  ?   Cervical: No cervical adenopathy.  ?Skin: ?   General: Skin is warm and dry.  ?Neurological:  ?   Mental Status: She is alert and oriented to person, place, and time.  ?   Deep Tendon Reflexes: Reflexes are normal and symmetric.  ?Psychiatric:     ?   Behavior: Behavior normal.     ?   Thought Content: Thought content normal.     ?   Judgment: Judgment normal.  ? ? ?BP 104/65   Pulse 86   Temp 98.2 ?F (36.8 ?C) (Temporal)   Resp 20   Ht _0   (1.575 m)   Wt 208 lb (94.3 kg)   SpO2 97%   BMI 38.04 kg/m?  ? ? ? ?   ?  Assessment & Plan:  ?LISVET RASHEED comes in today with chief complaint of Medical Management of Chronic Issues ? ? ?Diagnosis and orders addressed: ? ?1. Essential hypertension, benign ?Low sodium diet ?- lisinopril-hydrochlorothiazide (ZESTORETIC) 20-12.5 MG tablet; Take 1 tablet by mouth daily.  Dispense: 90 tablet; Refill: 1 ? ?2. Mixed hyperlipidemia ?Low fat diet ?- rosuvastatin (CRESTOR) 20 MG tablet; Take 1 tablet (20 mg total) by mouth daily.  Dispense: 90 tablet; Refill: 1 ? ?3. Gastroesophageal reflux disease without esophagitis ?Avoid spicy foods ?Do not eat 2 hours prior to bedtime ?- omeprazole (PRILOSEC) 20 MG capsule; Take 1 capsule (20 mg total) by mouth daily.  Dispense: 90 capsule; Refill: 1 ? ?4. Generalized edema ?Elevate legs when sitting ? ?5. Vitamin D deficiency ?Continue daily vitamin d supplement ? ?6. Morbid obesity ([place]) ?Discussed diet and exercise for person with BMI >25 ?Will recheck weight in 3-6 months ? ? ? ?Labs pending ?Health Maintenance reviewed ?Diet and exercise encouraged ? ?Follow up plan: ?6 months ? ? ?[name], FNP ? ? ?

## 2021-12-19 LAB — CBC WITH DIFFERENTIAL/PLATELET
Basophils Absolute: 0.1 10*3/uL (ref 0.0–0.2)
Basos: 1 %
EOS (ABSOLUTE): 0.2 10*3/uL (ref 0.0–0.4)
Eos: 2 %
Hematocrit: 41.3 % (ref 34.0–46.6)
Hemoglobin: 13.8 g/dL (ref 11.1–15.9)
Immature Grans (Abs): 0 10*3/uL (ref 0.0–0.1)
Immature Granulocytes: 0 %
Lymphocytes Absolute: 2.2 10*3/uL (ref 0.7–3.1)
Lymphs: 20 %
MCH: 28.5 pg (ref 26.6–33.0)
MCHC: 33.4 g/dL (ref 31.5–35.7)
MCV: 85 fL (ref 79–97)
Monocytes Absolute: 1 10*3/uL — ABNORMAL HIGH (ref 0.1–0.9)
Monocytes: 9 %
Neutrophils Absolute: 7.4 10*3/uL — ABNORMAL HIGH (ref 1.4–7.0)
Neutrophils: 68 %
Platelets: 267 10*3/uL (ref 150–450)
RBC: 4.85 x10E6/uL (ref 3.77–5.28)
RDW: 12.1 % (ref 11.7–15.4)
WBC: 10.9 10*3/uL — ABNORMAL HIGH (ref 3.4–10.8)

## 2021-12-19 LAB — CMP14+EGFR
ALT: 21 IU/L (ref 0–32)
AST: 19 IU/L (ref 0–40)
Albumin/Globulin Ratio: 2.2 (ref 1.2–2.2)
Albumin: 4.2 g/dL (ref 3.7–4.7)
Alkaline Phosphatase: 112 IU/L (ref 44–121)
BUN/Creatinine Ratio: 18 (ref 12–28)
BUN: 23 mg/dL (ref 8–27)
Bilirubin Total: 0.4 mg/dL (ref 0.0–1.2)
CO2: 23 mmol/L (ref 20–29)
Calcium: 9.4 mg/dL (ref 8.7–10.3)
Chloride: 103 mmol/L (ref 96–106)
Creatinine, Ser: 1.3 mg/dL — ABNORMAL HIGH (ref 0.57–1.00)
Globulin, Total: 1.9 g/dL (ref 1.5–4.5)
Glucose: 110 mg/dL — ABNORMAL HIGH (ref 70–99)
Potassium: 4.8 mmol/L (ref 3.5–5.2)
Sodium: 140 mmol/L (ref 134–144)
Total Protein: 6.1 g/dL (ref 6.0–8.5)
eGFR: 42 mL/min/{1.73_m2} — ABNORMAL LOW (ref >59)

## 2021-12-19 LAB — LIPID PANEL
Chol/HDL Ratio: 3.1 ratio (ref 0.0–4.4)
Cholesterol, Total: 167 mg/dL (ref 100–199)
HDL: 54 mg/dL (ref >39)
LDL Chol Calc (NIH): 90 mg/dL (ref 0–99)
Triglycerides: 133 mg/dL (ref 0–149)
VLDL Cholesterol Cal: 23 mg/dL (ref 5–40)

## 2021-12-19 NOTE — Addendum Note (Signed)
Addended by: Rolena Infante on: 12/19/2021 09:01 AM ? ? Modules accepted: Orders ? ?

## 2021-12-20 NOTE — Telephone Encounter (Signed)
Refill sent.

## 2021-12-24 ENCOUNTER — Ambulatory Visit: Payer: Medicare PPO | Admitting: Family Medicine

## 2021-12-24 ENCOUNTER — Encounter: Payer: Self-pay | Admitting: Family Medicine

## 2021-12-24 VITALS — BP 114/71 | HR 83 | Temp 98.0°F | Ht 62.0 in | Wt 208.4 lb

## 2021-12-24 DIAGNOSIS — N3090 Cystitis, unspecified without hematuria: Secondary | ICD-10-CM

## 2021-12-24 LAB — URINALYSIS, COMPLETE
Bilirubin, UA: NEGATIVE
Glucose, UA: NEGATIVE
Ketones, UA: NEGATIVE
Nitrite, UA: POSITIVE — AB
Protein,UA: NEGATIVE
Specific Gravity, UA: 1.015 (ref 1.005–1.030)
Urobilinogen, Ur: 1 mg/dL (ref 0.2–1.0)
pH, UA: 6.5 (ref 5.0–7.5)

## 2021-12-24 LAB — MICROSCOPIC EXAMINATION
Epithelial Cells (non renal): NONE SEEN /hpf (ref 0–10)
Renal Epithel, UA: NONE SEEN /hpf

## 2021-12-24 MED ORDER — CIPROFLOXACIN HCL 250 MG PO TABS: 250.0000 mg | ORAL_TABLET | Freq: Two times a day (BID) | ORAL | 0 refills | Status: DC

## 2021-12-24 NOTE — Progress Notes (Signed)
? ?Subjective:  ?Patient ID: Sarah Benton, female    DOB: 1946-05-16  Age: 76 y.o. MRN: [id number] ? ?CC: Cystitis ? ? ?HPI ?Sarah Benton presents for burning, stinging and pressure with urination and frequency for several days. Denies fever . No flank pain. No nausea, vomiting. ONset 2 days ago.  ? ? ? ?  12/24/2021  ? 12:35 PM 12/18/2021  ?  9:58 AM 07/16/2021  ? 11:14 AM  ?Depression screen PHQ 2/9  ?Decreased Interest 0 0 0  ?Down, Depressed, Hopeless 0 0 0  ?PHQ - 2 Score 0 0 0  ?Altered sleeping  0 1  ?Tired, decreased energy  0 0  ?Change in appetite  0 0  ?Feeling bad or failure about yourself   0 0  ?Trouble concentrating  0 0  ?Moving slowly or fidgety/restless  0 0  ?Suicidal thoughts  0 0  ?PHQ-9 Score  0 1  ?Difficult doing work/chores  Not difficult at all Somewhat difficult  ? ? ?History ?Sarah Benton has a past medical history of Anemia, Arthritis, Cataract, Edema, Esophageal stricture, GERD (gastroesophageal reflux disease), Hyperlipidemia, Hypertension, Obesity, and Ruptured lumbar disc.  ? ?She has a past surgical history that includes Lumbar laminectomy (1998); Breast biopsy (1993); Tubal ligation (1979); Tonsillectomy; Abdominal hysterectomy (Bilateral, 2001); and Breast cyst excision (Left).  ? ?Her family history includes Arthritis in her sister; Diabetes in her daughter and son; Diabetes (age of onset: 51) in her grandchild; Hypertension in her father; Ovarian cancer in her mother; Parkinsonism (age of onset: 44) in her mother; Prostate cancer (age of onset: 29) in her father.She reports that she has never smoked. She has never used smokeless tobacco. She reports that she does not drink alcohol and does not use drugs. ? ? ? ?ROS ?Review of Systems  ?Constitutional: Negative.   ?HENT: Negative.    ?Eyes:  Negative for visual disturbance.  ?Respiratory:  Negative for shortness of breath.   ?Cardiovascular:  Negative for chest pain.  ?Gastrointestinal:  Negative for abdominal pain.   ?Genitourinary:  Positive for difficulty urinating and frequency. Negative for flank pain.  ?Musculoskeletal:  Negative for arthralgias.  ? ?Objective:  ?BP 114/71   Pulse 83   Temp 98 ?F (36.7 ?C)   Ht '5\' 2"'$  (1.575 m)   Wt 208 lb 6.4 oz (94.5 kg)   SpO2 95%   BMI 38.12 kg/m?  ? ?BP Readings from Last 3 Encounters:  ?12/24/21 114/71  ?12/18/21 104/65  ?08/21/21 101/68  ? ? ?Wt Readings from Last 3 Encounters:  ?12/24/21 208 lb 6.4 oz (94.5 kg)  ?12/18/21 208 lb (94.3 kg)  ?08/21/21 213 lb (96.6 kg)  ? ? ? ?Physical Exam ?Constitutional:   ?   General: She is not in acute distress. ?   Appearance: She is well-developed.  ?HENT:  ?   Head: Normocephalic and atraumatic.  ?Cardiovascular:  ?   Rate and Rhythm: Normal rate and regular rhythm.  ?   Heart sounds: No murmur heard. ?Pulmonary:  ?   Effort: Pulmonary effort is normal.  ?   Breath sounds: Normal breath sounds.  ?Abdominal:  ?   General: Bowel sounds are normal.  ?   Palpations: Abdomen is soft. There is no mass.  ?   Tenderness: There is no abdominal tenderness. There is no guarding or rebound.  ?Musculoskeletal:     ?   General: No tenderness. Normal range of motion.  ?Skin: ?   General: Skin is warm and  dry.  ?Neurological:  ?   Mental Status: She is alert and oriented to person, place, and time.  ?Psychiatric:     ?   Behavior: Behavior normal.  ? ? ? ? ?Assessment & Plan:  ? ?Sarah Benton was seen today for cystitis. ? ?Diagnoses and all orders for this visit: ? ?Cystitis ?-     Urinalysis, Complete ?-     Urine Culture ? ?Other orders ?-     ciprofloxacin (CIPRO) 250 MG tablet; Take 1 tablet (250 mg total) by mouth 2 (two) times daily. ? ? ? ? ? ? ?I am having Sarah Benton. Satre start on ciprofloxacin. I am also having her maintain her Vitamin D3, Coenzyme Q10 (CO Q-10 PO), ketoconazole 2%-triamcinolone 0.1% 1:2 cream mixture, ketoconazole, fluticasone, lisinopril-hydrochlorothiazide, omeprazole, rosuvastatin, and meloxicam. ? ?Allergies as of 12/24/2021    ? ?   Reactions  ? Amoxicillin Diarrhea  ? Crestor [rosuvastatin Calcium]   ? Leg cramps  ? Septra [sulfamethoxazole-trimethoprim]   ? Nausea   ? Atorvastatin Other (See Comments)  ? Myalgias / muscle aches  ? ?  ? ?  ?Medication List  ?  ? ?  ? Accurate as of December 24, 2021  1:00 PM. If you have any questions, ask your nurse or doctor.  ?  ?  ? ?  ? ?ciprofloxacin 250 MG tablet ?Commonly known as: Cipro ?Take 1 tablet (250 mg total) by mouth 2 (two) times daily. ?Started by: Claretta Fraise, MD ?  ?CO Q-10 PO ?Take by mouth. ?  ?fluticasone 0.05 % cream ?Commonly known as: CUTIVATE ?APPLY TO THE AFFECTED AREAS UP TO TWICE A DAY AS NEEDED ?  ?ketoconazole 2 % cream ?Commonly known as: NIZORAL ?APPLY TO THE AFFECTED AREAS UP TO TWICE A DAY AS NEEDED ?  ?ketoconazole 2%-triamcinolone 0.1% 1:2 cream mixture ?Apply topically 2 (two) times daily as needed. ?  ?lisinopril-hydrochlorothiazide 20-12.5 MG tablet ?Commonly known as: ZESTORETIC ?Take 1 tablet by mouth daily. ?  ?meloxicam 15 MG tablet ?Commonly known as: MOBIC ?TAKE 1 TABLET DAILY ?  ?omeprazole 20 MG capsule ?Commonly known as: PRILOSEC ?Take 1 capsule (20 mg total) by mouth daily. ?  ?rosuvastatin 20 MG tablet ?Commonly known as: CRESTOR ?Take 1 tablet (20 mg total) by mouth daily. ?  ?Vitamin D3 50 MCG (2000 UT) capsule ?Take 2,000 Units by mouth daily. ?  ? ?  ? ? ? ?Follow-up: No follow-ups on file. ? ?Claretta Fraise, M.D. ?

## 2021-12-27 LAB — URINE CULTURE

## 2022-01-08 ENCOUNTER — Ambulatory Visit: Payer: Medicare PPO | Admitting: Family Medicine

## 2022-01-08 ENCOUNTER — Encounter: Payer: Self-pay | Admitting: Family Medicine

## 2022-01-08 DIAGNOSIS — M47816 Spondylosis without myelopathy or radiculopathy, lumbar region: Secondary | ICD-10-CM | POA: Diagnosis not present

## 2022-01-08 DIAGNOSIS — R3989 Other symptoms and signs involving the genitourinary system: Secondary | ICD-10-CM | POA: Diagnosis not present

## 2022-01-08 LAB — MICROSCOPIC EXAMINATION: Renal Epithel, UA: NONE SEEN /hpf

## 2022-01-08 LAB — URINALYSIS, ROUTINE W REFLEX MICROSCOPIC
Bilirubin, UA: NEGATIVE
Glucose, UA: NEGATIVE
Ketones, UA: NEGATIVE
Leukocytes,UA: NEGATIVE
Nitrite, UA: NEGATIVE
Protein,UA: NEGATIVE
Specific Gravity, UA: 1.015 (ref 1.005–1.030)
Urobilinogen, Ur: 0.2 mg/dL (ref 0.2–1.0)
pH, UA: 5 (ref 5.0–7.5)

## 2022-01-08 MED ORDER — CIPROFLOXACIN HCL 500 MG PO TABS: 500.0000 mg | ORAL_TABLET | Freq: Two times a day (BID) | ORAL | 0 refills | Status: AC

## 2022-01-08 NOTE — Progress Notes (Signed)
? ?Virtual Visit via Telephone Note ? ?I connected with [name] on 01/08/22 at 12:58 PM by telephone and verified that I am speaking with the correct person using two identifiers. Sarah Benton is currently located at home and nobody is currently with her during this visit. The provider, Loman Brooklyn, FNP is located in their office at time of visit. ? ?I discussed the limitations, risks, security and privacy concerns of performing an evaluation and management service by telephone and the availability of in person appointments. I also discussed with the patient that there may be a patient responsible charge related to this service. The patient expressed understanding and agreed to proceed. ? ?Subjective: ?PCP: Chevis Pretty, FNP ? ?Chief Complaint  ?Patient presents with  ? Urinary Tract Infection  ? ?Patient complains of frequency and suprapubic pressure. She has had symptoms for a little over 2 weeks. Patient denies fever, vaginal discharge, and rash . Patient does have a history of recurrent UTI.  Patient does not have a history of pyelonephritis.  Patient was treated with Cipro 250 mg twice daily x5 days on 12/24/2021.  She does not feel her symptoms ever completely went away. ? ? ?ROS: Per HPI ? ?Current Outpatient Medications:  ?  Cholecalciferol (VITAMIN D3) 2000 UNITS capsule, Take 2,000 Units by mouth daily., Disp: , Rfl:  ?  ciprofloxacin (CIPRO) 250 MG tablet, Take 1 tablet (250 mg total) by mouth 2 (two) times daily., Disp: 10 tablet, Rfl: 0 ?  Coenzyme Q10 (CO Q-10 PO), Take by mouth., Disp: , Rfl:  ?  fluticasone (CUTIVATE) 0.05 % cream, APPLY TO THE AFFECTED AREAS UP TO TWICE A DAY AS NEEDED, Disp: 30 g, Rfl: 3 ?  ketoconazole (NIZORAL) 2 % cream, APPLY TO THE AFFECTED AREAS UP TO TWICE A DAY AS NEEDED, Disp: 30 g, Rfl: 3 ?  ketoconazole 2%-triamcinolone 0.1% 1:2 cream mixture, Apply topically 2 (two) times daily as needed., Disp: 45 g, Rfl: 5 ?  lisinopril-hydrochlorothiazide  (ZESTORETIC) 20-12.5 MG tablet, Take 1 tablet by mouth daily., Disp: 90 tablet, Rfl: 1 ?  meloxicam (MOBIC) 15 MG tablet, TAKE 1 TABLET DAILY, Disp: 90 tablet, Rfl: 0 ?  omeprazole (PRILOSEC) 20 MG capsule, Take 1 capsule (20 mg total) by mouth daily., Disp: 90 capsule, Rfl: 1 ?  rosuvastatin (CRESTOR) 20 MG tablet, Take 1 tablet (20 mg total) by mouth daily., Disp: 90 tablet, Rfl: 1 ? ?Allergies  ?Allergen Reactions  ? Amoxicillin Diarrhea  ? Crestor [Rosuvastatin Calcium]   ?  Leg cramps ?  ? Septra [Sulfamethoxazole-Trimethoprim]   ?  Nausea   ? Atorvastatin Other (See Comments)  ?  Myalgias / muscle aches ?  ? ?Past Medical History:  ?Diagnosis Date  ? Anemia   ? Iron def  ? Arthritis   ? Cataract   ? Edema   ? Esophageal stricture   ? GERD (gastroesophageal reflux disease)   ? Hyperlipidemia   ? Hypertension   ? Obesity   ? Ruptured lumbar disc   ? ? ?Observations/Objective: ?A&O  ?No respiratory distress or wheezing audible over the phone ?Mood, judgement, and thought processes all WNL ? ?Assessment and Plan: ?1. Suspected UTI ?- Urinalysis, Routine w reflex microscopic; Future ?- ciprofloxacin (CIPRO) 500 MG tablet; Take 1 tablet (500 mg total) by mouth 2 (two) times daily for 7 days.  Dispense: 14 tablet; Refill: 0 ? ? ?Follow Up Instructions: ? ?I discussed the assessment and treatment plan with the patient. The patient  was provided an opportunity to ask questions and all were answered. The patient agreed with the plan and demonstrated an understanding of the instructions. ?  ?The patient was advised to call back or seek an in-person evaluation if the symptoms worsen or if the condition fails to improve as anticipated. ? ?The above assessment and management plan was discussed with the patient. The patient verbalized understanding of and has agreed to the management plan. Patient is aware to call the clinic if symptoms persist or worsen. Patient is aware when to return to the clinic for a follow-up visit.  Patient educated on when it is appropriate to go to the emergency department.  ? ?Time call ended: 1:09 PM ? ?I provided 11 minutes of non-face-to-face time during this encounter. ? ?Hendricks Limes, MSN, APRN, FNP-C ?[place] ?01/08/22 ? ? ?

## 2022-01-09 LAB — URINE CULTURE

## 2022-02-12 DIAGNOSIS — M545 Low back pain, unspecified: Secondary | ICD-10-CM | POA: Diagnosis not present

## 2022-02-12 DIAGNOSIS — M47816 Spondylosis without myelopathy or radiculopathy, lumbar region: Secondary | ICD-10-CM | POA: Diagnosis not present

## 2022-02-12 DIAGNOSIS — M5451 Vertebrogenic low back pain: Secondary | ICD-10-CM | POA: Diagnosis not present

## 2022-03-09 ENCOUNTER — Other Ambulatory Visit: Payer: Self-pay | Admitting: Nurse Practitioner

## 2022-03-09 DIAGNOSIS — I10 Essential (primary) hypertension: Secondary | ICD-10-CM

## 2022-03-09 DIAGNOSIS — K219 Gastro-esophageal reflux disease without esophagitis: Secondary | ICD-10-CM

## 2022-04-13 DIAGNOSIS — I10 Essential (primary) hypertension: Secondary | ICD-10-CM | POA: Diagnosis not present

## 2022-04-13 DIAGNOSIS — B369 Superficial mycosis, unspecified: Secondary | ICD-10-CM | POA: Diagnosis not present

## 2022-04-13 DIAGNOSIS — M4604 Spinal enthesopathy, thoracic region: Secondary | ICD-10-CM | POA: Diagnosis not present

## 2022-04-13 DIAGNOSIS — K219 Gastro-esophageal reflux disease without esophagitis: Secondary | ICD-10-CM | POA: Diagnosis not present

## 2022-04-13 DIAGNOSIS — E785 Hyperlipidemia, unspecified: Secondary | ICD-10-CM | POA: Diagnosis not present

## 2022-04-13 DIAGNOSIS — G629 Polyneuropathy, unspecified: Secondary | ICD-10-CM | POA: Diagnosis not present

## 2022-04-13 DIAGNOSIS — R32 Unspecified urinary incontinence: Secondary | ICD-10-CM | POA: Diagnosis not present

## 2022-04-13 DIAGNOSIS — Z6837 Body mass index (BMI) 37.0-37.9, adult: Secondary | ICD-10-CM | POA: Diagnosis not present

## 2022-04-21 IMAGING — MG MM DIGITAL SCREENING BILAT W/ TOMO AND CAD
6 of 12 series · 6 of 36 positions shown · non-contrast
Comparison: Previous exam(s).

CLINICAL DATA: Screening.

EXAM:
DIGITAL SCREENING BILATERAL MAMMOGRAM WITH TOMOSYNTHESIS AND CAD
TECHNIQUE: Bilateral screening digital craniocaudal and mediolateral oblique
mammograms were obtained. Bilateral screening digital breast
tomosynthesis was performed. The images were evaluated with
computer-aided detection.

[L CC synth-2D]
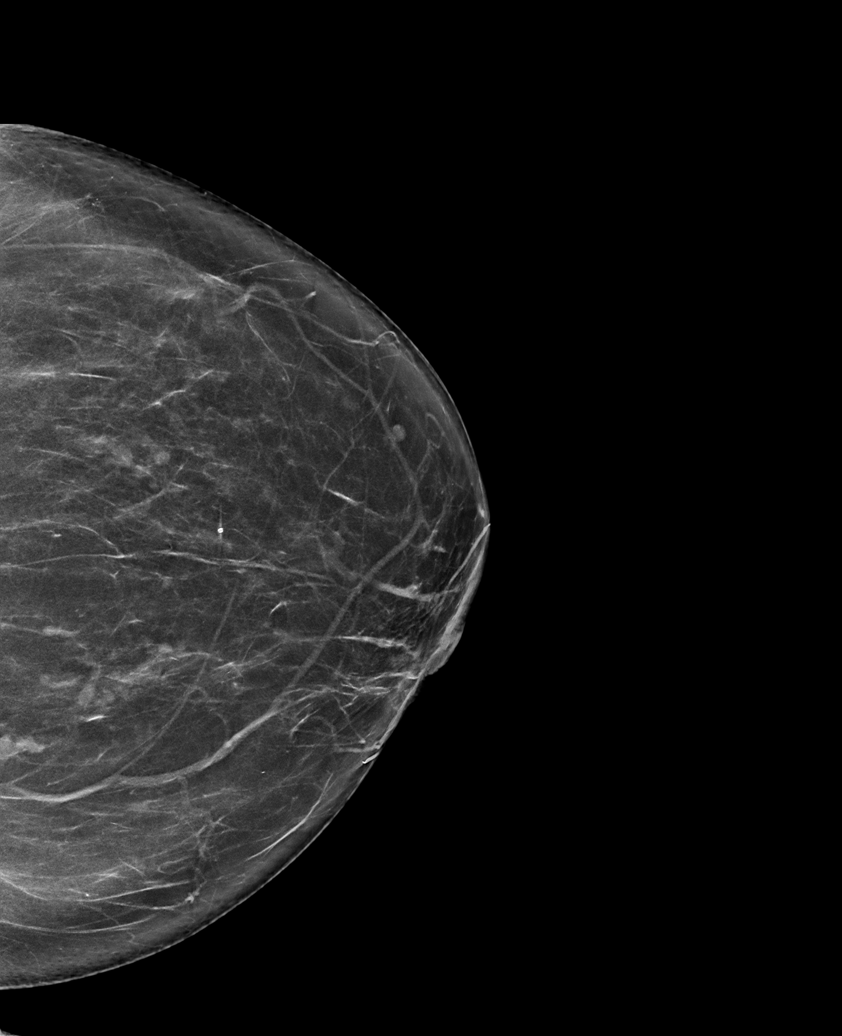

[R CC synth-2D]
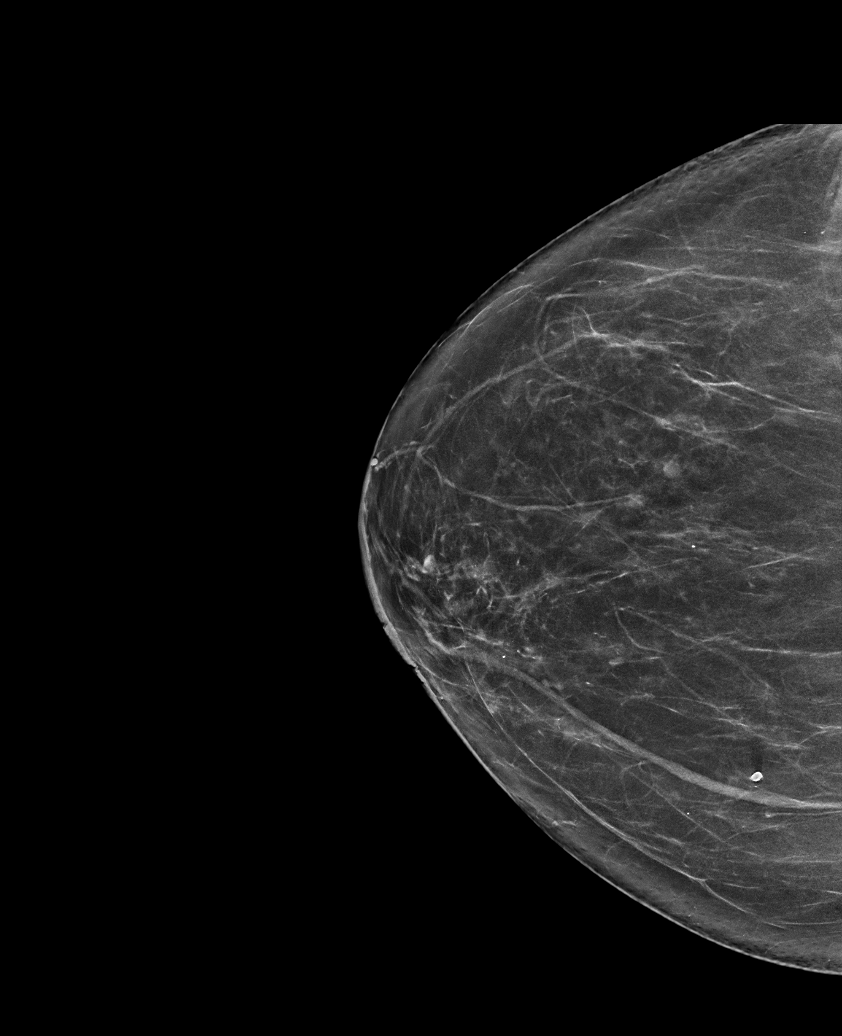

[R MLO synth-2D (1 of 2)]
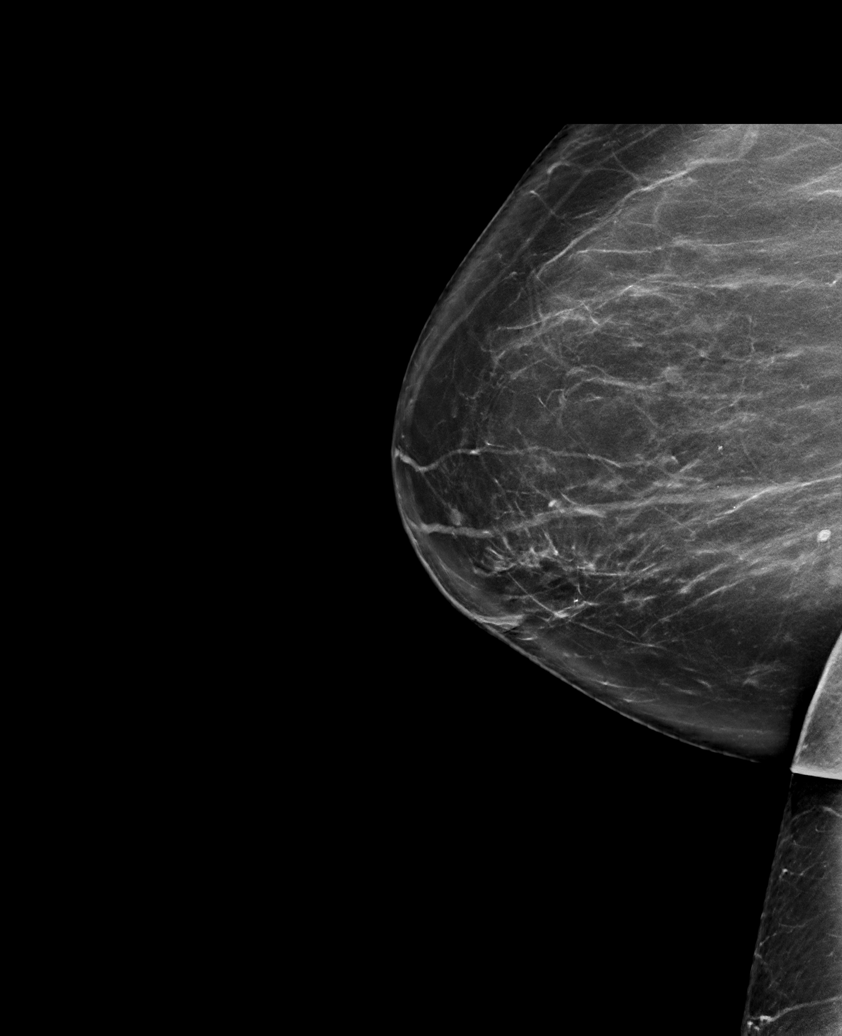

[R MLO synth-2D (2 of 2)]
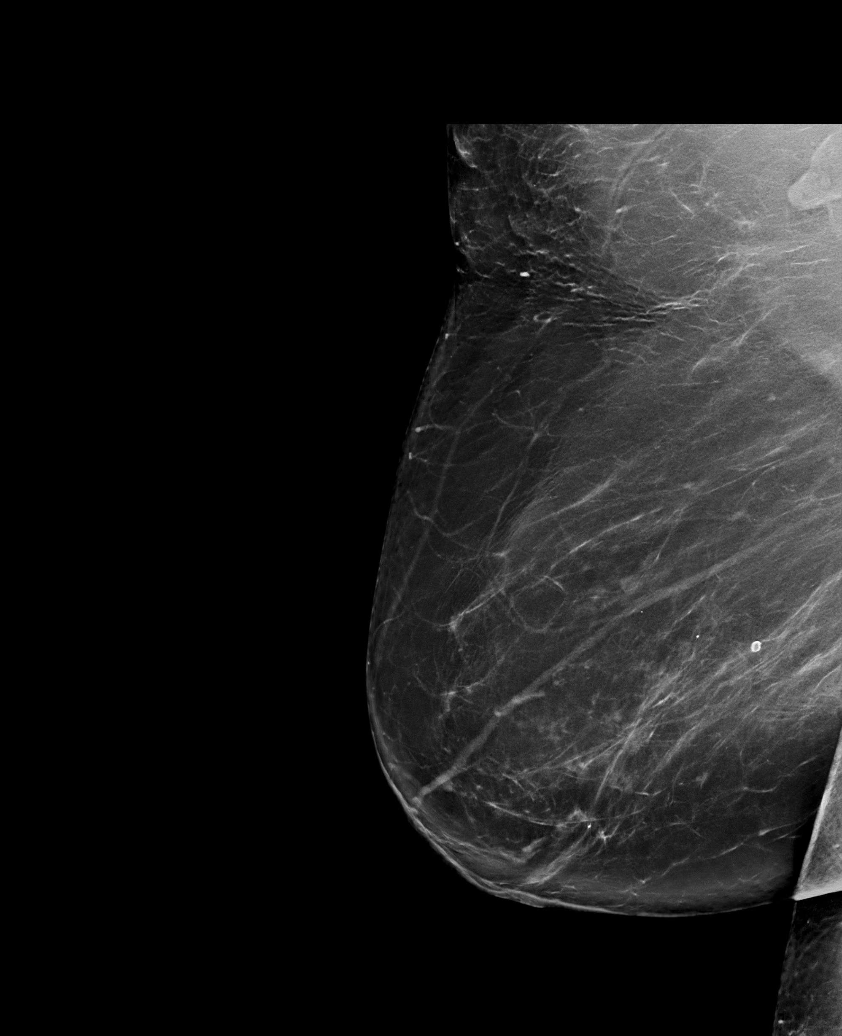

[L MLO synth-2D (1 of 2)]
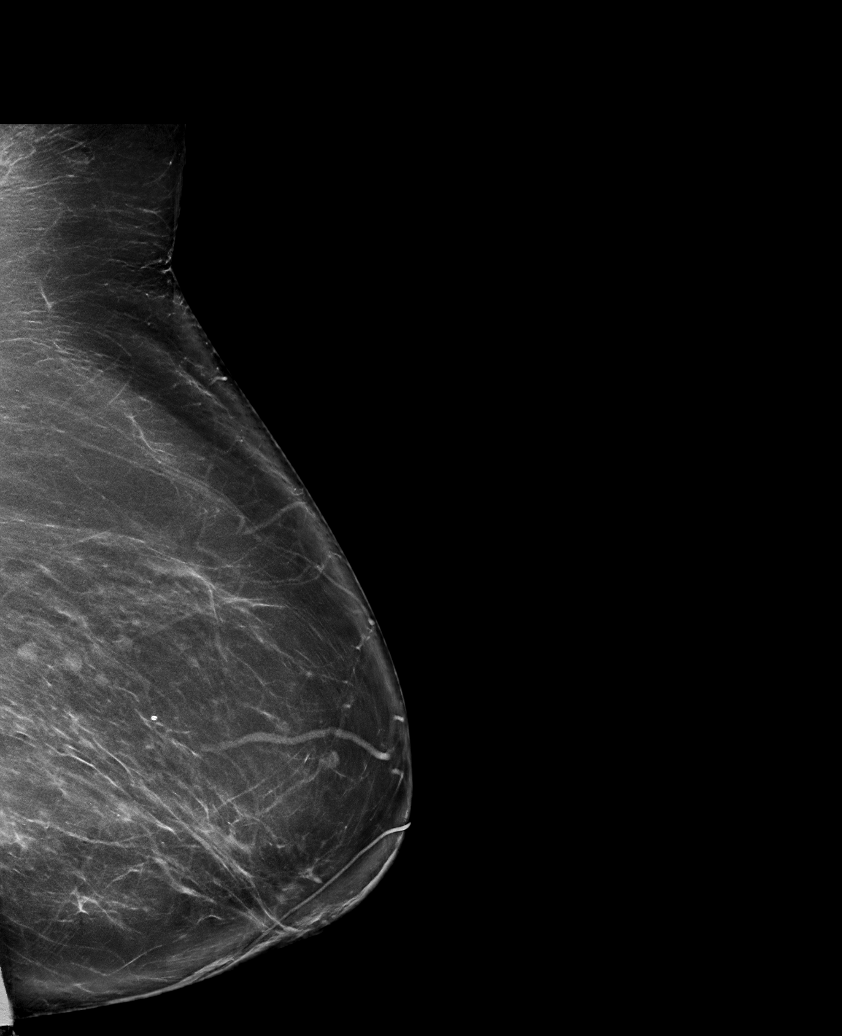

[L MLO synth-2D (2 of 2)]
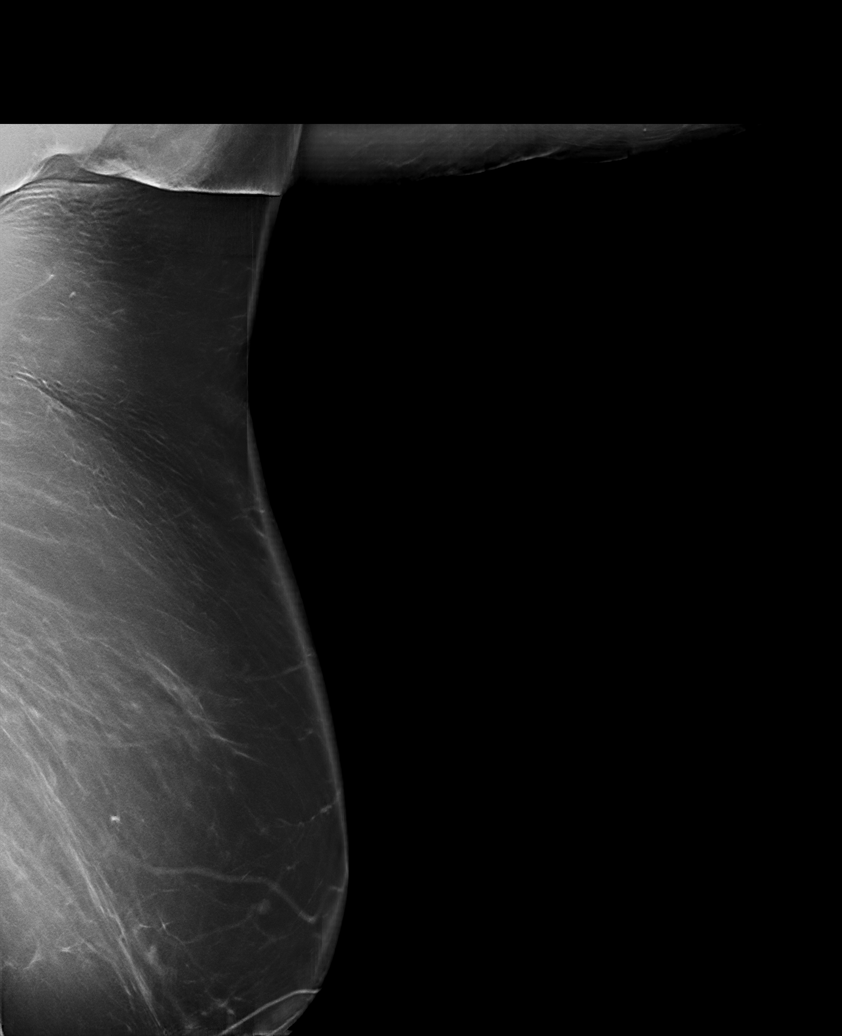

[6 of 36 positions shown; findings below may reference images not displayed]

ACR Breast Density Category b: There are scattered areas of
fibroglandular density.
FINDINGS: There are no findings suspicious for malignancy.
IMPRESSION: No mammographic evidence of malignancy. A result letter of this
screening mammogram will be mailed directly to the patient.

RECOMMENDATION:
Screening mammogram in one year. (Code:51-O-LD2)

BI-RADS CATEGORY  1: Negative.

## 2022-04-22 ENCOUNTER — Other Ambulatory Visit: Payer: Self-pay | Admitting: Nurse Practitioner

## 2022-04-30 ENCOUNTER — Ambulatory Visit: Payer: Self-pay | Admitting: *Deleted

## 2022-04-30 ENCOUNTER — Telehealth: Payer: Self-pay

## 2022-04-30 NOTE — Telephone Encounter (Signed)
Patient called concerned about a mychart message regarding an encounter that was opened. I apologized to the patient and informed her that an encounter was opened by mistake. Patient was concerned that she was charged for something she did not get. I informed patient that no charges were filed during that encounter. Patient verbalized understanding.

## 2022-05-08 DIAGNOSIS — H25813 Combined forms of age-related cataract, bilateral: Secondary | ICD-10-CM | POA: Diagnosis not present

## 2022-05-08 DIAGNOSIS — H524 Presbyopia: Secondary | ICD-10-CM | POA: Diagnosis not present

## 2022-05-08 DIAGNOSIS — H5203 Hypermetropia, bilateral: Secondary | ICD-10-CM | POA: Diagnosis not present

## 2022-06-11 DIAGNOSIS — E559 Vitamin D deficiency, unspecified: Secondary | ICD-10-CM | POA: Diagnosis not present

## 2022-06-11 DIAGNOSIS — Z131 Encounter for screening for diabetes mellitus: Secondary | ICD-10-CM | POA: Diagnosis not present

## 2022-06-11 DIAGNOSIS — M546 Pain in thoracic spine: Secondary | ICD-10-CM | POA: Diagnosis not present

## 2022-06-11 DIAGNOSIS — Z79899 Other long term (current) drug therapy: Secondary | ICD-10-CM | POA: Diagnosis not present

## 2022-06-11 DIAGNOSIS — M542 Cervicalgia: Secondary | ICD-10-CM | POA: Diagnosis not present

## 2022-06-11 DIAGNOSIS — M129 Arthropathy, unspecified: Secondary | ICD-10-CM | POA: Diagnosis not present

## 2022-06-11 DIAGNOSIS — E7800 Pure hypercholesterolemia, unspecified: Secondary | ICD-10-CM | POA: Diagnosis not present

## 2022-06-11 DIAGNOSIS — R5383 Other fatigue: Secondary | ICD-10-CM | POA: Diagnosis not present

## 2022-06-11 DIAGNOSIS — R7309 Other abnormal glucose: Secondary | ICD-10-CM | POA: Diagnosis not present

## 2022-06-11 DIAGNOSIS — M545 Low back pain, unspecified: Secondary | ICD-10-CM | POA: Diagnosis not present

## 2022-06-21 ENCOUNTER — Ambulatory Visit: Payer: Medicare PPO | Admitting: Nurse Practitioner

## 2022-06-21 ENCOUNTER — Encounter: Payer: Self-pay | Admitting: Nurse Practitioner

## 2022-06-21 VITALS — BP 121/64 | HR 65 | Temp 98.1°F | Ht 62.0 in | Wt 212.0 lb

## 2022-06-21 DIAGNOSIS — R601 Generalized edema: Secondary | ICD-10-CM

## 2022-06-21 DIAGNOSIS — K219 Gastro-esophageal reflux disease without esophagitis: Secondary | ICD-10-CM | POA: Diagnosis not present

## 2022-06-21 DIAGNOSIS — E559 Vitamin D deficiency, unspecified: Secondary | ICD-10-CM | POA: Diagnosis not present

## 2022-06-21 DIAGNOSIS — I10 Essential (primary) hypertension: Secondary | ICD-10-CM | POA: Diagnosis not present

## 2022-06-21 DIAGNOSIS — E782 Mixed hyperlipidemia: Secondary | ICD-10-CM | POA: Diagnosis not present

## 2022-06-21 DIAGNOSIS — Z23 Encounter for immunization: Secondary | ICD-10-CM

## 2022-06-21 MED ORDER — ROSUVASTATIN CALCIUM 20 MG PO TABS: 20.0000 mg | ORAL_TABLET | Freq: Every day | ORAL | 1 refills | Status: DC

## 2022-06-21 MED ORDER — LISINOPRIL-HYDROCHLOROTHIAZIDE 20-12.5 MG PO TABS: 1.0000 | ORAL_TABLET | Freq: Every day | ORAL | 1 refills | Status: DC

## 2022-06-21 MED ORDER — OMEPRAZOLE 20 MG PO CPDR: 20.0000 mg | DELAYED_RELEASE_CAPSULE | Freq: Every day | ORAL | 1 refills | Status: DC

## 2022-06-21 NOTE — Patient Instructions (Signed)

## 2022-06-21 NOTE — Addendum Note (Signed)
Addended by: Nigel Berthold C on: 06/21/2022 09:03 AM   Modules accepted: Orders

## 2022-06-21 NOTE — Progress Notes (Signed)
Subjective:    Patient ID: Sarah Benton, female    DOB: [birth year], 76 y.o.   MRN: [id number]   Chief Complaint: medical management of chronic issues     HPI:  Sarah Benton is a 76 y.o. who identifies as a female who was assigned female at birth.   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 Essential hypertension, benign No c/o chest pain, sob or headache. Does not check blood pressure at home. BP Readings from Last 3 Encounters:  12/24/21 114/71  12/18/21 104/65  08/21/21 101/68     2. Mixed hyperlipidemia Does not watch diet and does no dedicated exercise. Lab Results  Component Value Date   CHOL 167 12/18/2021   HDL 54 12/18/2021   LDLCALC 90 12/18/2021   TRIG 133 12/18/2021   CHOLHDL 3.1 12/18/2021   The 10-year ASCVD risk score (Arnett DK, et al., 2019) is: 25.7%   3. Gastroesophageal reflux disease without esophagitis Is on omeprazole daily and is doing well.  4. Vitamin D deficiency Is on daily vitamin d supplement  5. Generalized edema Has edema in the evening tome  6. Morbid obesity ([place]) No recent weight changes Wt Readings from Last 3 Encounters:  06/21/22 212 lb (96.2 kg)  12/24/21 208 lb 6.4 oz (94.5 kg)  12/18/21 208 lb (94.3 kg)   BMI Readings from Last 3 Encounters:  06/21/22 38.78 kg/m  12/24/21 38.12 kg/m  12/18/21 38.04 kg/m     New complaints: None today  Allergies  Allergen Reactions   Amoxicillin Diarrhea   Crestor [Rosuvastatin Calcium]     Leg cramps    Septra [Sulfamethoxazole-Trimethoprim]     Nausea    Atorvastatin Other (See Comments)    Myalgias / muscle aches    Outpatient Encounter Medications as of 06/21/2022  Medication Sig   Cholecalciferol (VITAMIN D3) 2000 UNITS capsule Take 2,000 Units by mouth daily.   Coenzyme Q10 (CO Q-10 PO) Take by mouth.    fluticasone (CUTIVATE) 0.05 % cream APPLY TO THE AFFECTED AREAS UP TO TWICE A DAY AS NEEDED   ketoconazole (NIZORAL) 2 % cream APPLY TO THE AFFECTED AREAS UP TO TWICE A DAY AS NEEDED   ketoconazole 2%-triamcinolone 0.1% 1:2 cream mixture Apply topically 2 (two) times daily as needed.   lisinopril-hydrochlorothiazide (ZESTORETIC) 20-12.5 MG tablet TAKE ONE TABLET ONCE DAILY   meloxicam (MOBIC) 15 MG tablet TAKE 1 TABLET DAILY   omeprazole (PRILOSEC) 20 MG capsule TAKE ONE CAPSULE ONCE DAILY   rosuvastatin (CRESTOR) 20 MG tablet Take 1 tablet (20 mg total) by mouth daily.   No facility-administered encounter medications on file as of 06/21/2022.    Past Surgical History:  Procedure Laterality Date   ABDOMINAL HYSTERECTOMY Bilateral 2001   complete   BREAST BIOPSY  1993   Left/negative   BREAST CYST EXCISION Left    LUMBAR LAMINECTOMY  1998   ruptured disc   TONSILLECTOMY     TUBAL LIGATION  1979    Family History  Problem Relation Age of Onset   Parkinsonism Mother 40   Ovarian cancer Mother    Hypertension Father    Prostate cancer Father 80       met to bone / hip   Arthritis Sister        knee   Diabetes Daughter    Diabetes Son  type 2   Diabetes Grandchild 17       type 1   Colon cancer Neg Hx    Breast cancer Neg Hx       Controlled substance contract: n/a     Review of Systems  Constitutional:  Negative for diaphoresis.  Eyes:  Negative for pain.  Respiratory:  Negative for shortness of breath.   Cardiovascular:  Negative for chest pain, palpitations and leg swelling.  Gastrointestinal:  Negative for abdominal pain.  Endocrine: Negative for polydipsia.  Skin:  Negative for rash.  Neurological:  Negative for dizziness, weakness and headaches.  Hematological:  Does not bruise/bleed easily.  All other systems reviewed and are negative.      Objective:   Physical Exam Vitals and nursing note reviewed.  Constitutional:      General: She is not  in acute distress.    Appearance: Normal appearance. She is well-developed.  HENT:     Head: Normocephalic.     Right Ear: Tympanic membrane normal.     Left Ear: Tympanic membrane normal.     Nose: Nose normal.     Mouth/Throat:     Mouth: Mucous membranes are moist.  Eyes:     Pupils: Pupils are equal, round, and reactive to light.  Neck:     Vascular: No carotid bruit or JVD.  Cardiovascular:     Rate and Rhythm: Normal rate and regular rhythm.     Heart sounds: Normal heart sounds.  Pulmonary:     Effort: Pulmonary effort is normal. No respiratory distress.     Breath sounds: Normal breath sounds. No wheezing or rales.  Chest:     Chest wall: No tenderness.  Abdominal:     General: Bowel sounds are normal. There is no distension or abdominal bruit.     Palpations: Abdomen is soft. There is no hepatomegaly, splenomegaly, mass or pulsatile mass.     Tenderness: There is no abdominal tenderness.  Musculoskeletal:        General: Normal range of motion.     Cervical back: Normal range of motion and neck supple.  Lymphadenopathy:     Cervical: No cervical adenopathy.  Skin:    General: Skin is warm and dry.  Neurological:     Mental Status: She is alert and oriented to person, place, and time.     Deep Tendon Reflexes: Reflexes are normal and symmetric.  Psychiatric:        Behavior: Behavior normal.        Thought Content: Thought content normal.        Judgment: Judgment normal.    BP 121/64   Pulse 65   Temp 98.1 F (36.7 C) (Temporal)   Ht _0  (1.575 m)   Wt 212 lb (96.2 kg)   SpO2 92%   BMI 38.78 kg/m          Assessment & Plan:   Sarah Benton comes in today with chief complaint of Medical Management of Chronic Issues   Diagnosis and orders addressed:  1. Essential hypertension, benign Low sodium diet - lisinopril-hydrochlorothiazide (ZESTORETIC) 20-12.5 MG tablet; Take 1 tablet by mouth daily.  Dispense: 90 tablet; Refill: 1 - CBC with  Differential/Platelet - CMP14+EGFR  2. Mixed hyperlipidemia Low fat diet - rosuvastatin (CRESTOR) 20 MG tablet; Take 1 tablet (20 mg total) by mouth daily.  Dispense: 90 tablet; Refill: 1 - Lipid panel  3. Gastroesophageal reflux disease without esophagitis Avoid spicy foods Do not  eat 2 hours prior to bedtime - omeprazole (PRILOSEC) 20 MG capsule; Take 1 capsule (20 mg total) by mouth daily.  Dispense: 90 capsule; Refill: 1  4. Vitamin D deficiency Continue daily vitamin d supplement  5. Generalized edema Elevate legs when swell  6. Morbid obesity ([place]) Discussed diet and exercise for person with BMI >25 Will recheck weight in 3-6 months    Labs pending Health Maintenance reviewed Diet and exercise encouraged  Follow up plan: 6 months   [name], FNP

## 2022-06-22 LAB — CBC WITH DIFFERENTIAL/PLATELET
Basophils Absolute: 0.1 10*3/uL (ref 0.0–0.2)
Basos: 1 %
EOS (ABSOLUTE): 0.2 10*3/uL (ref 0.0–0.4)
Eos: 3 %
Hematocrit: 39.7 % (ref 34.0–46.6)
Hemoglobin: 13 g/dL (ref 11.1–15.9)
Immature Grans (Abs): 0 10*3/uL (ref 0.0–0.1)
Immature Granulocytes: 0 %
Lymphocytes Absolute: 1.6 10*3/uL (ref 0.7–3.1)
Lymphs: 23 %
MCH: 28.2 pg (ref 26.6–33.0)
MCHC: 32.7 g/dL (ref 31.5–35.7)
MCV: 86 fL (ref 79–97)
Monocytes Absolute: 0.5 10*3/uL (ref 0.1–0.9)
Monocytes: 7 %
Neutrophils Absolute: 4.9 10*3/uL (ref 1.4–7.0)
Neutrophils: 66 %
Platelets: 267 10*3/uL (ref 150–450)
RBC: 4.61 x10E6/uL (ref 3.77–5.28)
RDW: 12.6 % (ref 11.7–15.4)
WBC: 7.3 10*3/uL (ref 3.4–10.8)

## 2022-06-22 LAB — CMP14+EGFR
ALT: 23 IU/L (ref 0–32)
AST: 30 IU/L (ref 0–40)
Albumin/Globulin Ratio: 2.1 (ref 1.2–2.2)
Albumin: 4.1 g/dL (ref 3.8–4.8)
Alkaline Phosphatase: 117 IU/L (ref 44–121)
BUN/Creatinine Ratio: 17 (ref 12–28)
BUN: 22 mg/dL (ref 8–27)
Bilirubin Total: 0.4 mg/dL (ref 0.0–1.2)
CO2: 20 mmol/L (ref 20–29)
Calcium: 9.7 mg/dL (ref 8.7–10.3)
Chloride: 102 mmol/L (ref 96–106)
Creatinine, Ser: 1.26 mg/dL — ABNORMAL HIGH (ref 0.57–1.00)
Globulin, Total: 2 g/dL (ref 1.5–4.5)
Glucose: 112 mg/dL — ABNORMAL HIGH (ref 70–99)
Potassium: 4.9 mmol/L (ref 3.5–5.2)
Sodium: 138 mmol/L (ref 134–144)
Total Protein: 6.1 g/dL (ref 6.0–8.5)
eGFR: 44 mL/min/{1.73_m2} — ABNORMAL LOW (ref >59)

## 2022-06-22 LAB — LIPID PANEL
Chol/HDL Ratio: 3.5 ratio (ref 0.0–4.4)
Cholesterol, Total: 165 mg/dL (ref 100–199)
HDL: 47 mg/dL (ref >39)
LDL Chol Calc (NIH): 87 mg/dL (ref 0–99)
Triglycerides: 184 mg/dL — ABNORMAL HIGH (ref 0–149)
VLDL Cholesterol Cal: 31 mg/dL (ref 5–40)

## 2022-06-25 DIAGNOSIS — M545 Low back pain, unspecified: Secondary | ICD-10-CM | POA: Diagnosis not present

## 2022-06-25 DIAGNOSIS — M5137 Other intervertebral disc degeneration, lumbosacral region: Secondary | ICD-10-CM | POA: Diagnosis not present

## 2022-06-25 DIAGNOSIS — M4150 Other secondary scoliosis, site unspecified: Secondary | ICD-10-CM | POA: Diagnosis not present

## 2022-06-25 DIAGNOSIS — E119 Type 2 diabetes mellitus without complications: Secondary | ICD-10-CM | POA: Diagnosis not present

## 2022-06-25 DIAGNOSIS — R7309 Other abnormal glucose: Secondary | ICD-10-CM | POA: Diagnosis not present

## 2022-07-10 ENCOUNTER — Ambulatory Visit (INDEPENDENT_AMBULATORY_CARE_PROVIDER_SITE_OTHER): Payer: Medicare PPO

## 2022-07-10 DIAGNOSIS — Z23 Encounter for immunization: Secondary | ICD-10-CM

## 2022-07-23 ENCOUNTER — Other Ambulatory Visit: Payer: Self-pay | Admitting: Nurse Practitioner

## 2022-07-23 DIAGNOSIS — Z79899 Other long term (current) drug therapy: Secondary | ICD-10-CM | POA: Diagnosis not present

## 2022-07-23 DIAGNOSIS — E1169 Type 2 diabetes mellitus with other specified complication: Secondary | ICD-10-CM | POA: Diagnosis not present

## 2022-07-23 DIAGNOSIS — R7309 Other abnormal glucose: Secondary | ICD-10-CM | POA: Diagnosis not present

## 2022-07-23 DIAGNOSIS — M545 Low back pain, unspecified: Secondary | ICD-10-CM | POA: Diagnosis not present

## 2022-07-23 DIAGNOSIS — R7401 Elevation of levels of liver transaminase levels: Secondary | ICD-10-CM | POA: Diagnosis not present

## 2022-07-23 DIAGNOSIS — R400 Somnolence: Secondary | ICD-10-CM | POA: Diagnosis not present

## 2022-07-23 NOTE — Telephone Encounter (Signed)
Needs to be seen to discuss labs

## 2022-07-25 ENCOUNTER — Ambulatory Visit: Payer: Medicare PPO | Admitting: Nurse Practitioner

## 2022-07-25 ENCOUNTER — Encounter: Payer: Self-pay | Admitting: Nurse Practitioner

## 2022-07-25 VITALS — BP 123/64 | HR 76 | Temp 98.4°F | Resp 20 | Ht 62.0 in | Wt 212.0 lb

## 2022-07-25 DIAGNOSIS — E119 Type 2 diabetes mellitus without complications: Secondary | ICD-10-CM | POA: Diagnosis not present

## 2022-07-25 MED ORDER — BLOOD GLUCOSE METER KIT: PACK | 0 refills | Status: DC

## 2022-07-25 NOTE — Progress Notes (Signed)
Subjective:    Patient ID: Sarah Benton, female    DOB: 09-03-1946, 76 y.o.   MRN: [id number]   Chief Complaint: Discuss labs done at pain Dr   Pt comes in today to discuss lab results from labs done at with Dr. Kellie Shropshire at [facility] on 06/11/22. These results are not available in EHR, but pt brought in printout of results. HgbA1c done on 06/11/22 was 7.0%. Pt states Dr. Royce Macadamia ordered a liver ultrasound for elevated LFTs but she has not had it done because no one has called her to schedule it. However, LFTs were normal on labs drawn on 06/21/22.       Review of Systems  Constitutional:  Negative for activity change, appetite change and unexpected weight change.  Eyes:  Negative for visual disturbance.  Respiratory:  Negative for chest tightness and shortness of breath.   Cardiovascular:  Negative for chest pain.  Gastrointestinal:  Negative for abdominal pain, nausea and vomiting.  Endocrine: Negative for polydipsia, polyphagia and polyuria.  Genitourinary:  Negative for difficulty urinating.  Neurological:  Negative for dizziness, weakness and headaches.  Hematological:  Negative for adenopathy. Does not bruise/bleed easily.  All other systems reviewed and are negative.      Objective:   Physical Exam Vitals and nursing note reviewed.  Constitutional:      General: She is not in acute distress.    Appearance: Normal appearance. She is well-developed. She is not ill-appearing.  HENT:     Head: Normocephalic and atraumatic.  Cardiovascular:     Rate and Rhythm: Normal rate and regular rhythm.     Pulses: Normal pulses.     Heart sounds: Normal heart sounds.  Pulmonary:     Effort: Pulmonary effort is normal. No respiratory distress.     Breath sounds: Normal breath sounds. No wheezing, rhonchi or rales.  Abdominal:     General: Bowel sounds are normal. There is no distension.     Palpations: Abdomen is soft.     Tenderness: There is no abdominal  tenderness.  Lymphadenopathy:     Cervical: No cervical adenopathy.  Skin:    General: Skin is warm and dry.     Capillary Refill: Capillary refill takes less than 2 seconds.  Neurological:     General: No focal deficit present.     Mental Status: She is alert and oriented to person, place, and time.  Psychiatric:        Mood and Affect: Mood normal.        Behavior: Behavior normal.       BP 123/64   Pulse 76   Temp 98.4 F (36.9 C) (Temporal)   Resp 20   Ht '5\' 2"'$  (1.575 m)   Wt 212 lb (96.2 kg)   SpO2 98%   BMI 38.78 kg/m      Assessment & Plan:   Sarah Benton in today with chief complaint of Discuss labs done at pain Dr   1. New onset type 2 diabetes mellitus ([place]) Discussed diet modification and exercise for management at this point; will recheck A1c in 1 month and make changes at that time as indicated. Watch carb and sugar intake. Exercise as tolerated. Check blood sugar and keep log; bring with you to next appointment.  LFTs drawn here on 06/21/22 were normal; no indication for liver ultrasound at this time.   The above assessment and management plan was discussed with the patient. The patient verbalized understanding of  and has agreed to the management plan. Patient is aware to call the clinic if symptoms persist or worsen. Patient is aware when to return to the clinic for a follow-up visit. Patient educated on when it is appropriate to go to the emergency department.   Sarah Leyden, FNP student  Sarah Benton, [place]

## 2022-08-19 ENCOUNTER — Other Ambulatory Visit: Payer: Self-pay | Admitting: Nurse Practitioner

## 2022-08-19 DIAGNOSIS — Z1231 Encounter for screening mammogram for malignant neoplasm of breast: Secondary | ICD-10-CM

## 2022-08-20 DIAGNOSIS — M545 Low back pain, unspecified: Secondary | ICD-10-CM | POA: Diagnosis not present

## 2022-08-20 DIAGNOSIS — Z79899 Other long term (current) drug therapy: Secondary | ICD-10-CM | POA: Diagnosis not present

## 2022-08-22 DIAGNOSIS — Z79899 Other long term (current) drug therapy: Secondary | ICD-10-CM | POA: Diagnosis not present

## 2022-08-23 ENCOUNTER — Ambulatory Visit: Payer: Medicare PPO | Admitting: Nurse Practitioner

## 2022-08-23 ENCOUNTER — Other Ambulatory Visit: Payer: Self-pay | Admitting: Nurse Practitioner

## 2022-08-23 ENCOUNTER — Encounter: Payer: Self-pay | Admitting: Nurse Practitioner

## 2022-08-23 VITALS — BP 110/62 | HR 106 | Temp 98.3°F | Resp 20 | Ht 62.0 in | Wt 203.0 lb

## 2022-08-23 DIAGNOSIS — E119 Type 2 diabetes mellitus without complications: Secondary | ICD-10-CM

## 2022-08-23 LAB — BAYER DCA HB A1C WAIVED: HB A1C (BAYER DCA - WAIVED): 6.5 % — ABNORMAL HIGH (ref 4.8–5.6)

## 2022-08-23 NOTE — Progress Notes (Signed)
Subjective:    Patient ID: Sarah Benton, female    DOB: [birth year], 76 y.o.   MRN: [id number]   Chief Complaint: No chief complaint on file.    HPI:  Sarah Benton is a 76 y.o. who identifies as a female who was assigned female at birth.   Social history: Lives with: husband Work history: retired   Scientist, forensic in today for follow up of the following chronic medical issues:  1. New onset type 2 diabetes mellitus [facility]) Patient dx with Diabetes 2 months ago. She has been trying to watch diet but very seldom does exercise. She does not check her blood sugars very often. Does not feel like her blood sugars are low. Lab Results  Component Value Date   HGBA1C 6.2 08/11/2018      New complaints: None today  Allergies  Allergen Reactions   Amoxicillin Diarrhea   Crestor [Rosuvastatin Calcium]     Leg cramps    Septra [Sulfamethoxazole-Trimethoprim]     Nausea    Sulfamethoxazole-Trimethoprim Other (See Comments)   Atorvastatin Other (See Comments)    Myalgias / muscle aches    Outpatient Encounter Medications as of 08/23/2022  Medication Sig   blood glucose meter kit and supplies Dispense based on patient and insurance preference. Use up to four times daily as directed. (FOR ICD-10 E10.9, E11.9).   Cholecalciferol (VITAMIN D3) 2000 UNITS capsule Take 2,000 Units by mouth daily.   Coenzyme Q10 (CO Q-10 PO) Take by mouth.   fluticasone (CUTIVATE) 0.05 % cream APPLY TO THE AFFECTED AREAS UP TO TWICE A DAY AS NEEDED   HYDROcodone-acetaminophen (NORCO) 10-325 MG tablet Take 1 tablet by mouth every 6 (six) hours as needed.   ketoconazole (NIZORAL) 2 % cream APPLY TO THE AFFECTED AREAS UP TO TWICE A DAY AS NEEDED   ketoconazole 2%-triamcinolone 0.1% 1:2 cream mixture Apply topically 2 (two) times daily as needed.   lisinopril-hydrochlorothiazide (ZESTORETIC) 20-12.5 MG tablet Take 1 tablet by mouth daily.   meloxicam (MOBIC) 15 MG tablet TAKE 1 TABLET DAILY   omeprazole  (PRILOSEC) 20 MG capsule Take 1 capsule (20 mg total) by mouth daily.   rosuvastatin (CRESTOR) 20 MG tablet Take 1 tablet (20 mg total) by mouth daily.   traMADol (ULTRAM) 50 MG tablet Take 50 mg by mouth 2 (two) times daily as needed.   No facility-administered encounter medications on file as of 08/23/2022.    Past Surgical History:  Procedure Laterality Date   ABDOMINAL HYSTERECTOMY Bilateral 2001   complete   BREAST BIOPSY  1993   Left/negative   BREAST CYST EXCISION Left    LUMBAR LAMINECTOMY  1998   ruptured disc   TONSILLECTOMY     TUBAL LIGATION  1979    Family History  Problem Relation Age of Onset   Parkinsonism Mother 32   Ovarian cancer Mother    Hypertension Father    Prostate cancer Father 58       met to bone / hip   Arthritis Sister        knee   Diabetes Daughter    Diabetes Son        type 2   Diabetes Grandchild 17       type 1   Colon cancer Neg Hx    Breast cancer Neg Hx       Controlled substance contract: n/a      Review of Systems  Constitutional:  Negative for diaphoresis.  Eyes:  Negative for  pain.  Respiratory:  Negative for shortness of breath.   Cardiovascular:  Negative for chest pain, palpitations and leg swelling.  Gastrointestinal:  Negative for abdominal pain.  Endocrine: Negative for polydipsia.  Skin:  Negative for rash.  Neurological:  Negative for dizziness, weakness and headaches.  Hematological:  Does not bruise/bleed easily.  All other systems reviewed and are negative.      Objective:   Physical Exam Vitals and nursing note reviewed.  Constitutional:      General: She is not in acute distress.    Appearance: Normal appearance. She is well-developed.  Neck:     Vascular: No carotid bruit or JVD.  Cardiovascular:     Rate and Rhythm: Normal rate and regular rhythm.     Heart sounds: Normal heart sounds.  Pulmonary:     Effort: Pulmonary effort is normal. No respiratory distress.     Breath sounds: Normal  breath sounds. No wheezing or rales.  Chest:     Chest wall: No tenderness.  Abdominal:     General: Bowel sounds are normal. There is no distension or abdominal bruit.     Palpations: Abdomen is soft. There is no hepatomegaly, splenomegaly, mass or pulsatile mass.     Tenderness: There is no abdominal tenderness.  Musculoskeletal:        General: Normal range of motion.     Cervical back: Normal range of motion and neck supple.  Lymphadenopathy:     Cervical: No cervical adenopathy.  Skin:    General: Skin is warm and dry.  Neurological:     Mental Status: She is alert and oriented to person, place, and time.     Deep Tendon Reflexes: Reflexes are normal and symmetric.  Psychiatric:        Behavior: Behavior normal.        Thought Content: Thought content normal.        Judgment: Judgment normal.     BP 110/62   Pulse (!) 106   Temp 98.3 F (36.8 C) (Temporal)   Resp 20   Ht _0  (1.575 m)   Wt 203 lb (92.1 kg)   SpO2 91%   BMI 37.13 kg/m    Hgba1c 6.5%     Assessment & Plan:  Sarah Benton in today with chief complaint of Diabetes   1. New onset type 2 diabetes mellitus ([place]) Continue to watch  diet - Bayer DCA Hb A1c Waived    The above assessment and management plan was discussed with the patient. The patient verbalized understanding of and has agreed to the management plan. Patient is aware to call the clinic if symptoms persist or worsen. Patient is aware when to return to the clinic for a follow-up visit. Patient educated on when it is appropriate to go to the emergency department.   [name], FNP

## 2022-10-02 ENCOUNTER — Other Ambulatory Visit: Payer: Self-pay | Admitting: Nurse Practitioner

## 2022-10-28 ENCOUNTER — Other Ambulatory Visit: Payer: Self-pay | Admitting: Nurse Practitioner

## 2022-11-04 DIAGNOSIS — Z79899 Other long term (current) drug therapy: Secondary | ICD-10-CM | POA: Diagnosis not present

## 2022-11-04 DIAGNOSIS — E119 Type 2 diabetes mellitus without complications: Secondary | ICD-10-CM | POA: Diagnosis not present

## 2022-11-04 DIAGNOSIS — E1169 Type 2 diabetes mellitus with other specified complication: Secondary | ICD-10-CM | POA: Diagnosis not present

## 2022-11-04 DIAGNOSIS — M545 Low back pain, unspecified: Secondary | ICD-10-CM | POA: Diagnosis not present

## 2022-11-05 ENCOUNTER — Ambulatory Visit: Payer: Medicare PPO

## 2022-11-08 ENCOUNTER — Ambulatory Visit
Admission: RE | Admit: 2022-11-08 | Discharge: 2022-11-08 | Disposition: A | Discharge status: Home / Self Care | Payer: Medicare PPO | Source: Ambulatory Visit | Attending: Nurse Practitioner | Admitting: Nurse Practitioner

## 2022-11-08 DIAGNOSIS — Z1231 Encounter for screening mammogram for malignant neoplasm of breast: Secondary | ICD-10-CM

## 2022-11-11 MED ORDER — TRIAMCINOLONE ACETONIDE 0.1 % EX CREA: TOPICAL_CREAM | Freq: Two times a day (BID) | CUTANEOUS | 5 refills | Status: DC | PRN

## 2022-11-11 NOTE — Telephone Encounter (Signed)
Meds ordered this encounter  Medications   ketoconazole 2%-triamcinolone 0.1% 1:2 cream mixture    Sig: Apply topically 2 (two) times daily as needed.    Dispense:  45 g    Refill:  5    Order Specific Question:   Supervising Provider    Answer:   Worthy Rancher [id number]   [place], FNP

## 2022-11-12 NOTE — Telephone Encounter (Signed)
[facility] is calling regarding refill on patients medicine:  Fluticasone Propionate 0.05 % APPLY TO THE AFFECTED AREAS UP TO TWICE A DAY AS NEEDED  Ketoconazole 2 % APPLY TO THE AFFECTED AREAS UP TO TWICE A DAY AS NEEDED  Pharmacy received refill on the Ketoconazole, but not the Fluticasone.  In the past they have taken these two creams and mixed them together for the patient.    Can you refill the Fluticasone so this can be done.  What was sent in is a compound and will cost the patient $25.00 more.  Please let us know what we need to do for the patient.

## 2022-12-10 ENCOUNTER — Ambulatory Visit (INDEPENDENT_AMBULATORY_CARE_PROVIDER_SITE_OTHER): Payer: Medicare PPO

## 2022-12-10 ENCOUNTER — Ambulatory Visit: Payer: Medicare PPO | Admitting: Nurse Practitioner

## 2022-12-10 ENCOUNTER — Encounter: Payer: Self-pay | Admitting: Nurse Practitioner

## 2022-12-10 VITALS — BP 129/76 | HR 63 | Temp 97.5°F | Resp 20 | Ht 62.4 in | Wt 193.0 lb

## 2022-12-10 DIAGNOSIS — Z139 Encounter for screening, unspecified: Secondary | ICD-10-CM | POA: Diagnosis not present

## 2022-12-10 DIAGNOSIS — Z0000 Encounter for general adult medical examination without abnormal findings: Secondary | ICD-10-CM

## 2022-12-10 DIAGNOSIS — Z6834 Body mass index (BMI) 34.0-34.9, adult: Secondary | ICD-10-CM

## 2022-12-10 DIAGNOSIS — E782 Mixed hyperlipidemia: Secondary | ICD-10-CM

## 2022-12-10 DIAGNOSIS — R601 Generalized edema: Secondary | ICD-10-CM | POA: Diagnosis not present

## 2022-12-10 DIAGNOSIS — E118 Type 2 diabetes mellitus with unspecified complications: Secondary | ICD-10-CM | POA: Diagnosis not present

## 2022-12-10 DIAGNOSIS — Z0001 Encounter for general adult medical examination with abnormal findings: Secondary | ICD-10-CM | POA: Diagnosis not present

## 2022-12-10 DIAGNOSIS — I10 Essential (primary) hypertension: Secondary | ICD-10-CM

## 2022-12-10 DIAGNOSIS — K219 Gastro-esophageal reflux disease without esophagitis: Secondary | ICD-10-CM | POA: Diagnosis not present

## 2022-12-10 DIAGNOSIS — E559 Vitamin D deficiency, unspecified: Secondary | ICD-10-CM

## 2022-12-10 LAB — BAYER DCA HB A1C WAIVED: HB A1C (BAYER DCA - WAIVED): 5.9 % — ABNORMAL HIGH (ref 4.8–5.6)

## 2022-12-10 LAB — LIPID PANEL

## 2022-12-10 MED ORDER — OZEMPIC (0.25 OR 0.5 MG/DOSE) 2 MG/3ML ~~LOC~~ SOPN: PEN_INJECTOR | SUBCUTANEOUS | 2 refills | Status: DC

## 2022-12-10 MED ORDER — LISINOPRIL-HYDROCHLOROTHIAZIDE 20-12.5 MG PO TABS: 1.0000 | ORAL_TABLET | Freq: Every day | ORAL | 1 refills | Status: DC

## 2022-12-10 MED ORDER — OMEPRAZOLE 20 MG PO CPDR: 20.0000 mg | DELAYED_RELEASE_CAPSULE | Freq: Every day | ORAL | 1 refills | Status: DC

## 2022-12-10 MED ORDER — ROSUVASTATIN CALCIUM 20 MG PO TABS: 20.0000 mg | ORAL_TABLET | Freq: Every day | ORAL | 1 refills | Status: DC

## 2022-12-10 NOTE — Progress Notes (Signed)
Subjective:     Chief Complaint: annual physical   HPI:  Sarah Benton is a 77 y.o. who identifies as a female who was assigned female at birth.   Social history: Lives with: husnband Work history: retired   Scientist, forensic in today for follow up of the following chronic medical issues:  1. Annual physical exam   2. Controlled type 2 diabetes mellitus with complication, without long-term current use of insulin (HCC) She has not been checking her blood sugars.  Lab Results  Component Value Date   HGBA1C 6.5 (H) 08/23/2022     3. Essential hypertension, benign No c/o chest pain, sob or headache. Does not check blood pressure at home. BP Readings from Last 3 Encounters:  12/10/22 129/76  08/23/22 110/62  07/25/22 123/64     4. Mixed hyperlipidemia Has been watching diet but doing no exercise. Lab Results  Component Value Date   CHOL 165 06/21/2022   HDL 47 06/21/2022   LDLCALC 87 06/21/2022   TRIG 184 (H) 06/21/2022   CHOLHDL 3.5 06/21/2022     5. Gastroesophageal reflux disease without esophagitis Is on omeprazole and is doing good  6. Generalized edema Has some lower ext edema by the end of each day.  7. Vitamin D deficiency Is on daily vitamin d supplement  8. Morbid obesity ([place]) Weight is down  lbs since last visit Wt Readings from Last 3 Encounters:  12/10/22 193 lb (87.5 kg)  08/23/22 203 lb (92.1 kg)  07/25/22 212 lb (96.2 kg)   BMI Readings from Last 3 Encounters:  12/10/22 34.85 kg/m  08/23/22 37.13 kg/m  07/25/22 38.78 kg/m      New complaints: None today  Allergies  Allergen Reactions   Amoxicillin Diarrhea   Crestor [Rosuvastatin Calcium]     Leg cramps    Septra [Sulfamethoxazole-Trimethoprim]     Nausea    Sulfamethoxazole-Trimethoprim Other (See Comments)   Atorvastatin Other (See Comments)    Myalgias / muscle aches    Outpatient Encounter Medications as of 12/10/2022  Medication Sig   Blood Glucose Monitoring  Suppl (ACCU-CHEK GUIDE) w/Device KIT USE UP TO FOUR TIMES DAILY AS DIRECTED Dx E11.9   Cholecalciferol (VITAMIN D3) 2000 UNITS capsule Take 2,000 Units by mouth daily.   Coenzyme Q10 (CO Q-10 PO) Take by mouth.   HYDROcodone-acetaminophen (NORCO) 10-325 MG tablet Take 1 tablet by mouth every 6 (six) hours as needed.   ketoconazole 2%-triamcinolone 0.1% 1:2 cream mixture Apply topically 2 (two) times daily as needed.   lisinopril-hydrochlorothiazide (ZESTORETIC) 20-12.5 MG tablet Take 1 tablet by mouth daily.   meloxicam (MOBIC) 15 MG tablet TAKE ONE TABLET DAILY   omeprazole (PRILOSEC) 20 MG capsule Take 1 capsule (20 mg total) by mouth daily.   rosuvastatin (CRESTOR) 20 MG tablet Take 1 tablet (20 mg total) by mouth daily.   [DISCONTINUED] traMADol (ULTRAM) 50 MG tablet Take 50 mg by mouth 2 (two) times daily as needed.   No facility-administered encounter medications on file as of 12/10/2022.    Past Surgical History:  Procedure Laterality Date   ABDOMINAL HYSTERECTOMY Bilateral 2001   complete   BREAST BIOPSY  1993   Left/negative   BREAST CYST EXCISION Left    LUMBAR LAMINECTOMY  1998   ruptured disc   TONSILLECTOMY     TUBAL LIGATION  1979    Family History  Problem Relation Age of Onset   Parkinsonism Mother 79   Ovarian cancer Mother    Hypertension  Father    Prostate cancer Father 63       met to bone / hip   Arthritis Sister        knee   Diabetes Daughter    Diabetes Son        type 2   Diabetes Grandchild 17       type 1   Colon cancer Neg Hx    Breast cancer Neg Hx       Controlled substance contract: n/a     Review of Systems  Constitutional:  Negative for diaphoresis.  Eyes:  Negative for pain.  Respiratory:  Negative for shortness of breath.   Cardiovascular:  Negative for chest pain, palpitations and leg swelling.  Gastrointestinal:  Negative for abdominal pain.  Endocrine: Negative for polydipsia.  Skin:  Negative for rash.  Neurological:   Negative for dizziness, weakness and headaches.  Hematological:  Does not bruise/bleed easily.  All other systems reviewed and are negative.      Objective:   Physical Exam Vitals and nursing note reviewed.  Constitutional:      General: She is not in acute distress.    Appearance: Normal appearance. She is well-developed.  HENT:     Head: Normocephalic.     Right Ear: Tympanic membrane normal.     Left Ear: Tympanic membrane normal.     Nose: Nose normal.     Mouth/Throat:     Mouth: Mucous membranes are moist.  Eyes:     Pupils: Pupils are equal, round, and reactive to light.  Neck:     Vascular: No carotid bruit or JVD.  Cardiovascular:     Rate and Rhythm: Normal rate and regular rhythm.     Heart sounds: Normal heart sounds.  Pulmonary:     Effort: Pulmonary effort is normal. No respiratory distress.     Breath sounds: Normal breath sounds. No wheezing or rales.  Chest:     Chest wall: No tenderness.  Abdominal:     General: Bowel sounds are normal. There is no distension or abdominal bruit.     Palpations: Abdomen is soft. There is no hepatomegaly, splenomegaly, mass or pulsatile mass.     Tenderness: There is no abdominal tenderness.  Musculoskeletal:        General: Normal range of motion.     Cervical back: Normal range of motion and neck supple.  Lymphadenopathy:     Cervical: No cervical adenopathy.  Skin:    General: Skin is warm and dry.  Neurological:     Mental Status: She is alert and oriented to person, place, and time.     Deep Tendon Reflexes: Reflexes are normal and symmetric.  Psychiatric:        Behavior: Behavior normal.        Thought Content: Thought content normal.        Judgment: Judgment normal.    BP 129/76   Pulse 63   Temp (!) 97.5 F (36.4 C) (Temporal)   Resp 20   Ht 5' 2.4" (1.585 m)   Wt 193 lb (87.5 kg)   SpO2 96%   BMI 34.85 kg/m   HGBA1c 5.9%  Chest xray- no acute or chronic findings  EKG- NSR        Assessment & Plan:   Sarah Benton comes in today with chief complaint of No chief complaint on file.   Diagnosis and orders addressed:  1. Annual physical exam   2. Controlled type 2  diabetes mellitus with complication, without long-term current use of insulin (HCC) Continue t wtahc carbs in diet - Bayer DCA Hb A1c Waived  3. Essential hypertension, benign Low sodium diet - CBC with Differential/Platelet - CMP14+EGFR - DG Chest 2 View - EKG 12-Lead - lisinopril-hydrochlorothiazide (ZESTORETIC) 20-12.5 MG tablet; Take 1 tablet by mouth daily.  Dispense: 90 tablet; Refill: 1  4. Mixed hyperlipidemia Low fat diet - Lipid panel - rosuvastatin (CRESTOR) 20 MG tablet; Take 1 tablet (20 mg total) by mouth daily.  Dispense: 90 tablet; Refill: 1  5. Gastroesophageal reflux disease without esophagitis Avoid spicy foods Do not eat 2 hours prior to bedtime  - omeprazole (PRILOSEC) 20 MG capsule; Take 1 capsule (20 mg total) by mouth daily.  Dispense: 90 capsule; Refill: 1  6. Generalized edema   7. Vitamin D deficiency Continue vitamin d daily  8. Morbid obesity ([place]) Discussed diet and exercise for person with BMI >25 Will recheck weight in 3-6 months    Labs pending Health Maintenance reviewed Diet and exercise encouraged  Follow up plan: 6 months   [name], FNP

## 2022-12-10 NOTE — Addendum Note (Signed)
Addended by: Chevis Pretty on: 12/10/2022 09:53 AM   Modules accepted: Orders

## 2022-12-10 NOTE — Patient Instructions (Signed)

## 2022-12-11 LAB — CBC WITH DIFFERENTIAL/PLATELET
Basophils Absolute: 0.1 10*3/uL (ref 0.0–0.2)
Basos: 1 %
EOS (ABSOLUTE): 0.2 10*3/uL (ref 0.0–0.4)
Eos: 2 %
Hematocrit: 38.5 % (ref 34.0–46.6)
Hemoglobin: 12.5 g/dL (ref 11.1–15.9)
Immature Grans (Abs): 0 10*3/uL (ref 0.0–0.1)
Immature Granulocytes: 0 %
Lymphocytes Absolute: 1.6 10*3/uL (ref 0.7–3.1)
Lymphs: 21 %
MCH: 27.7 pg (ref 26.6–33.0)
MCHC: 32.5 g/dL (ref 31.5–35.7)
MCV: 85 fL (ref 79–97)
Monocytes Absolute: 0.5 10*3/uL (ref 0.1–0.9)
Monocytes: 7 %
Neutrophils Absolute: 5.3 10*3/uL (ref 1.4–7.0)
Neutrophils: 69 %
Platelets: 238 10*3/uL (ref 150–450)
RBC: 4.52 x10E6/uL (ref 3.77–5.28)
RDW: 12.5 % (ref 11.7–15.4)
WBC: 7.7 10*3/uL (ref 3.4–10.8)

## 2022-12-11 LAB — CMP14+EGFR
ALT: 21 IU/L (ref 0–32)
AST: 24 IU/L (ref 0–40)
Albumin/Globulin Ratio: 2 (ref 1.2–2.2)
Albumin: 4 g/dL (ref 3.8–4.8)
Alkaline Phosphatase: 119 IU/L (ref 44–121)
BUN/Creatinine Ratio: 19 (ref 12–28)
BUN: 22 mg/dL (ref 8–27)
Bilirubin Total: 0.3 mg/dL (ref 0.0–1.2)
CO2: 22 mmol/L (ref 20–29)
Calcium: 9.6 mg/dL (ref 8.7–10.3)
Chloride: 105 mmol/L (ref 96–106)
Creatinine, Ser: 1.13 mg/dL — ABNORMAL HIGH (ref 0.57–1.00)
Globulin, Total: 2 g/dL (ref 1.5–4.5)
Glucose: 99 mg/dL (ref 70–99)
Potassium: 4.6 mmol/L (ref 3.5–5.2)
Sodium: 144 mmol/L (ref 134–144)
Total Protein: 6 g/dL (ref 6.0–8.5)
eGFR: 50 mL/min/{1.73_m2} — ABNORMAL LOW (ref >59)

## 2022-12-11 LAB — LIPID PANEL
Chol/HDL Ratio: 3.1 ratio (ref 0.0–4.4)
Cholesterol, Total: 160 mg/dL (ref 100–199)
HDL: 51 mg/dL (ref >39)
LDL Chol Calc (NIH): 87 mg/dL (ref 0–99)
Triglycerides: 123 mg/dL (ref 0–149)
VLDL Cholesterol Cal: 22 mg/dL (ref 5–40)

## 2022-12-23 ENCOUNTER — Ambulatory Visit: Payer: Medicare PPO | Admitting: Nurse Practitioner

## 2023-01-27 ENCOUNTER — Other Ambulatory Visit: Payer: Self-pay | Admitting: Nurse Practitioner

## 2023-02-19 ENCOUNTER — Emergency Department (HOSPITAL_BASED_OUTPATIENT_CLINIC_OR_DEPARTMENT_OTHER): Payer: Medicare PPO

## 2023-02-19 ENCOUNTER — Encounter (HOSPITAL_BASED_OUTPATIENT_CLINIC_OR_DEPARTMENT_OTHER): Payer: Self-pay | Admitting: Emergency Medicine

## 2023-02-19 ENCOUNTER — Observation Stay (HOSPITAL_BASED_OUTPATIENT_CLINIC_OR_DEPARTMENT_OTHER)
Admission: EM | Admit: 2023-02-19 | Discharge: 2023-02-20 | Disposition: A | Discharge status: Home / Self Care | Payer: Medicare PPO | Attending: Internal Medicine | Admitting: Internal Medicine

## 2023-02-19 ENCOUNTER — Other Ambulatory Visit: Payer: Self-pay

## 2023-02-19 DIAGNOSIS — I129 Hypertensive chronic kidney disease with stage 1 through stage 4 chronic kidney disease, or unspecified chronic kidney disease: Secondary | ICD-10-CM | POA: Insufficient documentation

## 2023-02-19 DIAGNOSIS — I639 Cerebral infarction, unspecified: Secondary | ICD-10-CM | POA: Diagnosis not present

## 2023-02-19 DIAGNOSIS — E1122 Type 2 diabetes mellitus with diabetic chronic kidney disease: Secondary | ICD-10-CM | POA: Diagnosis not present

## 2023-02-19 DIAGNOSIS — N1831 Chronic kidney disease, stage 3a: Secondary | ICD-10-CM | POA: Insufficient documentation

## 2023-02-19 DIAGNOSIS — Z79899 Other long term (current) drug therapy: Secondary | ICD-10-CM | POA: Insufficient documentation

## 2023-02-19 DIAGNOSIS — D649 Anemia, unspecified: Secondary | ICD-10-CM | POA: Diagnosis present

## 2023-02-19 DIAGNOSIS — E782 Mixed hyperlipidemia: Secondary | ICD-10-CM | POA: Insufficient documentation

## 2023-02-19 DIAGNOSIS — M545 Low back pain, unspecified: Secondary | ICD-10-CM | POA: Diagnosis not present

## 2023-02-19 DIAGNOSIS — R001 Bradycardia, unspecified: Secondary | ICD-10-CM | POA: Diagnosis not present

## 2023-02-19 DIAGNOSIS — R42 Dizziness and giddiness: Principal | ICD-10-CM | POA: Insufficient documentation

## 2023-02-19 DIAGNOSIS — K219 Gastro-esophageal reflux disease without esophagitis: Secondary | ICD-10-CM | POA: Diagnosis present

## 2023-02-19 DIAGNOSIS — I10 Essential (primary) hypertension: Secondary | ICD-10-CM | POA: Diagnosis present

## 2023-02-19 DIAGNOSIS — R2990 Unspecified symptoms and signs involving the nervous system: Secondary | ICD-10-CM

## 2023-02-19 DIAGNOSIS — E118 Type 2 diabetes mellitus with unspecified complications: Secondary | ICD-10-CM | POA: Diagnosis present

## 2023-02-19 DIAGNOSIS — R29818 Other symptoms and signs involving the nervous system: Secondary | ICD-10-CM | POA: Diagnosis not present

## 2023-02-19 DIAGNOSIS — E1169 Type 2 diabetes mellitus with other specified complication: Secondary | ICD-10-CM | POA: Diagnosis present

## 2023-02-19 DIAGNOSIS — H8110 Benign paroxysmal vertigo, unspecified ear: Secondary | ICD-10-CM | POA: Clinically undetermined

## 2023-02-19 DIAGNOSIS — G8929 Other chronic pain: Secondary | ICD-10-CM | POA: Diagnosis present

## 2023-02-19 DIAGNOSIS — G459 Transient cerebral ischemic attack, unspecified: Secondary | ICD-10-CM | POA: Diagnosis present

## 2023-02-19 DIAGNOSIS — N183 Chronic kidney disease, stage 3 unspecified: Secondary | ICD-10-CM | POA: Diagnosis present

## 2023-02-19 LAB — URINALYSIS, ROUTINE W REFLEX MICROSCOPIC
Bilirubin Urine: NEGATIVE
Glucose, UA: NEGATIVE mg/dL
Hgb urine dipstick: NEGATIVE
Leukocytes,Ua: NEGATIVE
Nitrite: NEGATIVE
Protein, ur: NEGATIVE mg/dL
Specific Gravity, Urine: 1.024 (ref 1.005–1.030)
pH: 6.5 (ref 5.0–8.0)

## 2023-02-19 LAB — DIFFERENTIAL
Abs Immature Granulocytes: 0.04 10*3/uL (ref 0.00–0.07)
Basophils Absolute: 0.1 10*3/uL (ref 0.0–0.1)
Basophils Relative: 1 %
Eosinophils Absolute: 0.2 10*3/uL (ref 0.0–0.5)
Eosinophils Relative: 1 %
Immature Granulocytes: 0 %
Lymphocytes Relative: 15 %
Lymphs Abs: 1.8 10*3/uL (ref 0.7–4.0)
Monocytes Absolute: 0.6 10*3/uL (ref 0.1–1.0)
Monocytes Relative: 5 %
Neutro Abs: 9.1 10*3/uL — ABNORMAL HIGH (ref 1.7–7.7)
Neutrophils Relative %: 78 %

## 2023-02-19 LAB — RAPID URINE DRUG SCREEN, HOSP PERFORMED
Amphetamines: NOT DETECTED
Barbiturates: NOT DETECTED
Benzodiazepines: NOT DETECTED
Cocaine: NOT DETECTED
Opiates: NOT DETECTED
Tetrahydrocannabinol: NOT DETECTED

## 2023-02-19 LAB — PROTIME-INR
INR: 1 (ref 0.8–1.2)
Prothrombin Time: 13 seconds (ref 11.4–15.2)

## 2023-02-19 LAB — CBC
HCT: 38.4 % (ref 36.0–46.0)
Hemoglobin: 12.6 g/dL (ref 12.0–15.0)
MCH: 27.9 pg (ref 26.0–34.0)
MCHC: 32.8 g/dL (ref 30.0–36.0)
MCV: 85.1 fL (ref 80.0–100.0)
Platelets: 258 10*3/uL (ref 150–400)
RBC: 4.51 MIL/uL (ref 3.87–5.11)
RDW: 13 % (ref 11.5–15.5)
WBC: 11.7 10*3/uL — ABNORMAL HIGH (ref 4.0–10.5)
nRBC: 0 % (ref 0.0–0.2)

## 2023-02-19 LAB — COMPREHENSIVE METABOLIC PANEL
ALT: 16 U/L (ref 0–44)
AST: 16 U/L (ref 15–41)
Albumin: 4.2 g/dL (ref 3.5–5.0)
Alkaline Phosphatase: 96 U/L (ref 38–126)
Anion gap: 12 (ref 5–15)
BUN: 24 mg/dL — ABNORMAL HIGH (ref 8–23)
CO2: 24 mmol/L (ref 22–32)
Calcium: 9.6 mg/dL (ref 8.9–10.3)
Chloride: 104 mmol/L (ref 98–111)
Creatinine, Ser: 1.05 mg/dL — ABNORMAL HIGH (ref 0.44–1.00)
GFR, Estimated: 54 mL/min — ABNORMAL LOW (ref >60)
Glucose, Bld: 111 mg/dL — ABNORMAL HIGH (ref 70–99)
Potassium: 3.9 mmol/L (ref 3.5–5.1)
Sodium: 140 mmol/L (ref 135–145)
Total Bilirubin: 0.5 mg/dL (ref 0.3–1.2)
Total Protein: 6.7 g/dL (ref 6.5–8.1)

## 2023-02-19 LAB — CBG MONITORING, ED: Glucose-Capillary: 113 mg/dL — ABNORMAL HIGH (ref 70–99)

## 2023-02-19 LAB — APTT: aPTT: 26 seconds (ref 24–36)

## 2023-02-19 LAB — ETHANOL: Alcohol, Ethyl (B): <10 mg/dL (ref <10)

## 2023-02-19 MED ORDER — ONDANSETRON HCL 4 MG/2ML IJ SOLN
4.0000 mg | Freq: Once | INTRAMUSCULAR | Status: AC
  Administered 2023-02-19: 4 mg via INTRAVENOUS
  Filled 2023-02-19: qty 2

## 2023-02-19 MED ORDER — IOHEXOL 350 MG/ML SOLN
100.0000 mL | Freq: Once | INTRAVENOUS | Status: AC | PRN
  Administered 2023-02-19: 75 mL via INTRAVENOUS

## 2023-02-19 MED ORDER — STROKE: EARLY STAGES OF RECOVERY BOOK
Freq: Once | Status: AC
  Filled 2023-02-19 (×2): qty 1

## 2023-02-19 MED ORDER — CLOPIDOGREL BISULFATE 75 MG PO TABS
75.0000 mg | ORAL_TABLET | Freq: Every day | ORAL | Status: DC
  Administered 2023-02-20: 75 mg via ORAL
  Filled 2023-02-19: qty 1

## 2023-02-19 MED ORDER — ASPIRIN 81 MG PO TBEC
81.0000 mg | DELAYED_RELEASE_TABLET | Freq: Every day | ORAL | Status: DC
  Administered 2023-02-19 – 2023-02-20 (×2): 81 mg via ORAL
  Filled 2023-02-19: qty 1

## 2023-02-19 MED ORDER — CLOPIDOGREL BISULFATE 300 MG PO TABS
300.0000 mg | ORAL_TABLET | Freq: Once | ORAL | Status: AC
  Administered 2023-02-19: 300 mg via ORAL
  Filled 2023-02-19: qty 1

## 2023-02-19 MED ORDER — MECLIZINE HCL 25 MG PO TABS
25.0000 mg | ORAL_TABLET | Freq: Once | ORAL | Status: AC
  Administered 2023-02-19: 25 mg via ORAL
  Filled 2023-02-19: qty 1

## 2023-02-19 NOTE — ED Notes (Signed)
Clear by tele neuro to wait for CMP for CT angio.

## 2023-02-19 NOTE — ED Notes (Signed)
Per EDP Sarah Benton, CT head without contrast now and waiting on pending labs for CT Angio Head. Pt currently on CT

## 2023-02-19 NOTE — Consult Note (Signed)
[facility] Telemedicine Consult   Requesting Provider: Ernie Avena Consult Participants: myself, atrium nurse, bedside nurse,  Location of the provider: [place], [place] Location of the patient: [facility]  This consult was provided via telemedicine with 2-way video and audio communication. The patient/family was informed that care would be provided in this way and agreed to receive care in this manner.   Reason for Consult: Dizziness code stroke Requesting Physician: Ernie Avena  CC: vertigo  History is obtained from: patient, chart review   HPI: Sarah Benton is a 77 y.o. female with a PMHx of HTN, HLD, DM (A1c 5.9% 11/2022), obesity, lumbar disc disease, prior vertigo, presenting with sudden onset dizziness.   She initially reported symptom onset at 4:30 PM but when discussing risk and benefit of TNK, she reported she felt off as early as 3:30 PM and can only be sure she was 100% well at 3 PM, which she noted would be out of the safe treatment window. She reported she initially tried to push through her dizziness symptoms taking meclizine at about 4 PM but symptoms were persistent so she came to the ED for evaluation where Dr. Karene Fry noted nystagmus on upgaze and activated code stroke. She reports similar dizziness on 5/18 that resolved with meclizine and similar vertigo about a year ago.   Denies any other associated symptoms including numbness, weakness, vision loss, headache, or hearing loss, fevers/chills/rashes, speech change, confusion. Does endorse nausea  LKW: 3:00 PM Thrombolytic given?: No, out of the window and patient preference on discussion of risk/benefit/alternative. Specifically we dicussed small brainstem stroke (if a stroke) was most likely but larger cerebellar stroke could not be ruled out clinically IA performed?: No, no LVO on my read, radiology read pending Premorbid modified rankin scale:      0 - No symptoms.  ROS: All other review of  systems was negative except as noted in the HPI.  Time of teleneurologist evaluation: 7:42 PM  Exam: Current vital signs: BP (!) 114/54   Pulse (!) 49   Temp 98.4 F (36.9 C) (Oral)   Resp 17   SpO2 98%  Vital signs in last 24 hours: Temp:  [98.4 F (36.9 C)] 98.4 F (36.9 C) (05/29 1855) Pulse Rate:  [49-61] 49 (05/29 2100) Resp:  [14-18] 17 (05/29 2100) BP: (114-135)/(47-76) 114/54 (05/29 2100) SpO2:  [97 %-100 %] 98 % (05/29 2100)    General: comfortable at rest, uncomfortable with movement Pulmonary: breathing comfortably Cardiac: regular rate and rhythm on monitor   NIH Stroke scale 1A: Level of Consciousness - 0 1B: Ask Month and Age - 0 1C: 'Blink Eyes' & 'Squeeze Hands' - 0 2: Test Horizontal Extraocular Movements - 0  3: Test Visual Fields - 0 4: Test Facial Palsy - 0 5A: Test Left Arm Motor Drift - 0 5B: Test Right Arm Motor Drift - 0 6A: Test Left Leg Motor Drift - 0 6B: Test Right Leg Motor Drift - 0 7: Test Limb Ataxia - 0 8: Test Sensation - 0 9: Test Language/Aphasia- 0 10: Test Dysarthria - 0 11: Test Extinction/Inattention - 0 NIHSS score: 0 Unsteady on standing requiring assistance  Mild nystagmus on upgaze, improved from Dr. Magdalene Molly initial exam   Imaging Reviewed:   Head CT personally reviewed, agree with radiology no acute intracranial process  CTA personally reviewed, agree with radiology:    1. No intracranial large vessel occlusion or significant stenosis. 2. No hemodynamically significant stenosis in the neck.  Labs reviewed  in epic and pertinent values follow:  Basic Metabolic Panel: Recent Labs  Lab 02/19/23 1950  NA 140  K 3.9  CL 104  CO2 24  GLUCOSE 111*  BUN 24*  CREATININE 1.05*  CALCIUM 9.6    CBC: Recent Labs  Lab 02/19/23 1950  WBC 11.7*  NEUTROABS 9.1*  HGB 12.6  HCT 38.4  MCV 85.1  PLT 258    Coagulation Studies: Recent Labs    02/19/23 1950  LABPROT 13.0  INR 1.0      Assessment: Given  nystagmus on upgaze, favor this event to be a stroke/TIA. Less likely Menierre's given no hearing loss described. Less likely vertigo given onset while active and pattern of nystagmus.   Recommendations:  # Likely small brainstem or cerebellar stroke vs. TIA - Stroke labs TSH, HgbA1c, fasting lipid panel - MRI brain - Frequent neuro checks - Echocardiogram - ASA 81 mg daily - Plavix 300 mg load with 75 mg daily for 21 day course  - Risk factor modification - Telemetry monitoring - Blood pressure goal   - Permissive hypertension to 220/120 due to acute stroke for 24-48 hours - PT consult, OT consult, Speech consult, unless patient is back to baseline, particularly vestibular pt consult - Stroke team to follow   This patient is receiving care for possible acute neurological changes. There was 60 minutes of care by this provider at the time of service, including time for direct evaluation via telemedicine, review of medical records, imaging studies and discussion of findings with providers, the patient and/or family.   Brooke Dare MD-PhD [facility] [phone]   If 8pm-8am, please page neurology on call as listed in AMION.  CRITICAL CARE Performed by: Gordy Councilman   Total critical care time: 60 minutes  Critical care time was exclusive of separately billable procedures and treating other patients.  Critical care was necessary to treat or prevent imminent or life-threatening deterioration, emergent evaluation for consideration of thrombectomy/thrombolytic.  Critical care was time spent personally by me on the following activities: development of treatment plan with patient and/or surrogate as well as nursing, discussions with consultants, evaluation of patient's response to treatment, examination of patient, obtaining history from patient or surrogate, ordering and performing treatments and interventions, ordering and review of laboratory studies, ordering and  review of radiographic studies, pulse oximetry and re-evaluation of patient's condition.

## 2023-02-19 NOTE — ED Notes (Addendum)
-  Code Stoke Activated via tele-cart at [employer].

## 2023-02-19 NOTE — ED Notes (Signed)
CT called for transport

## 2023-02-19 NOTE — ED Triage Notes (Signed)
Vertigo since 4:30pm. Hx of vertigo  nausea

## 2023-02-19 NOTE — ED Provider Notes (Addendum)
[place] EMERGENCY DEPARTMENT AT [facility] Provider Note   CSN: [id number] Arrival date & time: 02/19/23  1831     History  Chief Complaint  Patient presents with   Dizziness    Sarah Benton is a 77 y.o. female.   Dizziness    77 year old female with medical history significant for GERD, HTN, HLD, obesity, anemia, cataracts, reported history of vertigo years ago who presents to the emergency department with sudden onset vertiginous symptoms.  The patient states that around 4:30 PM she had sudden onset of room spinning vertigo.  She has been able to walk.  She denies any headaches, fevers or chills.  She denies any specific ear pain.  Endorses mild nausea.  No facial droop, no difficulty swallowing or speaking, no focal weakness or numbness.  Home Medications Prior to Admission medications   Medication Sig Start Date End Date Taking? Authorizing Provider  Blood Glucose Monitoring Suppl (ACCU-CHEK GUIDE) w/Device KIT USE UP TO FOUR TIMES DAILY AS DIRECTED Dx E11.9 08/23/22   Bennie Pierini, FNP  Cholecalciferol (VITAMIN D3) 2000 UNITS capsule Take 2,000 Units by mouth daily.    [provider]  Coenzyme Q10 (CO Q-10 PO) Take by mouth.    [provider]  HYDROcodone-acetaminophen (NORCO) 10-325 MG tablet Take 1 tablet by mouth every 6 (six) hours as needed.    [provider]  ketoconazole 2%-triamcinolone 0.1% 1:2 cream mixture Apply topically 2 (two) times daily as needed. 11/11/22   Daphine Deutscher, Mary-Margaret, FNP  lisinopril-hydrochlorothiazide (ZESTORETIC) 20-12.5 MG tablet Take 1 tablet by mouth daily. 12/10/22   Bennie Pierini, FNP  meloxicam (MOBIC) 15 MG tablet TAKE ONE TABLET DAILY 01/27/23   Daphine Deutscher, Mary-Margaret, FNP  omeprazole (PRILOSEC) 20 MG capsule Take 1 capsule (20 mg total) by mouth daily. 12/10/22   Daphine Deutscher, Mary-Margaret, FNP  rosuvastatin (CRESTOR) 20 MG tablet Take 1 tablet (20 mg total) by mouth daily. 12/10/22    Bennie Pierini, FNP  Semaglutide,0.25 or 0.5MG /DOS, (OZEMPIC, 0.25 OR 0.5 MG/DOSE,) 2 MG/3ML SOPN Start with 0.25mg  for 2 weeks then 0.5mg  weekly 12/10/22   Bennie Pierini, FNP      Allergies    Amoxicillin, Crestor [rosuvastatin calcium], Septra [sulfamethoxazole-trimethoprim], Sulfamethoxazole-trimethoprim, and Atorvastatin    Review of Systems   Review of Systems  Unable to perform ROS: Acuity of condition  Neurological:  Positive for dizziness.    Physical Exam Updated Vital Signs BP (!) 114/54   Pulse (!) 49   Temp 98.4 F (36.9 C) (Oral)   Resp 17   SpO2 98%  Physical Exam Vitals and nursing note reviewed.  Constitutional:      General: She is not in acute distress.    Appearance: She is well-developed.  HENT:     Head: Normocephalic and atraumatic.  Eyes:     Conjunctiva/sclera: Conjunctivae normal.  Cardiovascular:     Rate and Rhythm: Normal rate and regular rhythm.     Heart sounds: No murmur heard. Pulmonary:     Effort: Pulmonary effort is normal. No respiratory distress.     Breath sounds: Normal breath sounds.  Abdominal:     Palpations: Abdomen is soft.     Tenderness: There is no abdominal tenderness.  Musculoskeletal:        General: No swelling.     Cervical back: Neck supple.  Skin:    General: Skin is warm and dry.     Capillary Refill: Capillary refill takes less than 2 seconds.  Neurological:  Mental Status: She is alert.     Comments: MENTAL STATUS EXAM:    Orientation: Alert and oriented to person, place and time.  Memory: Cooperative, follows commands well.  Language: Speech is clear and language is normal.   CRANIAL NERVES:    CN 2 (Optic): Visual fields intact to confrontation.  CN 3,4,6 (EOM): Pupils equal and reactive to light. Full extraocular eye movement with nystagmus bilaterally and on upward gaze CN 5 (Trigeminal): Facial sensation is normal, no weakness of masticatory muscles.  CN 7 (Facial): No facial  weakness or asymmetry.  CN 8 (Auditory): Auditory acuity grossly normal.  CN 9,10 (Glossophar): The uvula is midline, the palate elevates symmetrically.  CN 11 (spinal access): Normal sternocleidomastoid and trapezius strength.  CN 12 (Hypoglossal): The tongue is midline. No atrophy or fasciculations.Marland Kitchen   MOTOR:  Muscle Strength: 5/5RUE, 5/5LUE, 5/5RLE, 5/5LLE.   COORDINATION:   Intact finger-to-nose, no tremor.   SENSATION:   Intact to light touch all four extremities.  GAIT: Unable to perform pt could not tolerate  HINTS: Nystagmus on bidirectional gaze and upward gaze Negative skew Normal head impulse test  Psychiatric:        Mood and Affect: Mood normal.     ED Results / Procedures / Treatments   Labs (all labs ordered are listed, but only abnormal results are displayed) Labs Reviewed  CBC - Abnormal; Notable for the following components:      Result Value   WBC 11.7 (*)    All other components within normal limits  DIFFERENTIAL - Abnormal; Notable for the following components:   Neutro Abs 9.1 (*)    All other components within normal limits  COMPREHENSIVE METABOLIC PANEL - Abnormal; Notable for the following components:   Glucose, Bld 111 (*)    BUN 24 (*)    Creatinine, Ser 1.05 (*)    GFR, Estimated 54 (*)    All other components within normal limits  CBG MONITORING, ED - Abnormal; Notable for the following components:   Glucose-Capillary 113 (*)    All other components within normal limits  ETHANOL  PROTIME-INR  APTT  URINALYSIS, ROUTINE W REFLEX MICROSCOPIC  RAPID URINE DRUG SCREEN, HOSP PERFORMED  LIPID PANEL  HEMOGLOBIN A1C    EKG EKG Interpretation  Date/Time:  Wednesday Feb 19 2023 19:04:48 EDT Ventricular Rate:  56 PR Interval:  170 QRS Duration: 118 QT Interval:  480 QTC Calculation: 463 R Axis:   -51 Text Interpretation: Sinus bradycardia Left axis deviation Low voltage QRS Incomplete right bundle branch block Cannot rule out Anterior  infarct , age undetermined Abnormal ECG Confirmed by Ernie Avena ([id number]) on 02/19/2023 7:37:36 PM  Radiology CT ANGIO HEAD NECK W WO CM (CODE STROKE)  Result Date: 02/19/2023 CLINICAL DATA:  Vertigo, nausea EXAM: CT ANGIOGRAPHY HEAD AND NECK WITH AND WITHOUT CONTRAST TECHNIQUE: Multidetector CT imaging of the head and neck was performed using the standard protocol during bolus administration of intravenous contrast. Multiplanar CT image reconstructions and MIPs were obtained to evaluate the vascular anatomy. Carotid stenosis measurements (when applicable) are obtained utilizing NASCET criteria, using the distal internal carotid diameter as the denominator. RADIATION DOSE REDUCTION: This exam was performed according to the departmental dose-optimization program which includes automated exposure control, adjustment of the mA and/or kV according to patient size and/or use of iterative reconstruction technique. CONTRAST:  75mL OMNIPAQUE IOHEXOL 350 MG/ML SOLN COMPARISON:  No prior CTA available, correlation is made with 02/19/2023 CT head FINDINGS: CT HEAD FINDINGS  For noncontrast findings, please see same day CT head. CTA NECK FINDINGS Aortic arch: Standard branching. Imaged portion shows no evidence of aneurysm or dissection. No significant stenosis of the major arch vessel origins. Right carotid system: No evidence of dissection, occlusion, or hemodynamically significant stenosis (greater than 50%). Left carotid system: No evidence of dissection, occlusion, or hemodynamically significant stenosis (greater than 50%). Vertebral arteries: No evidence of dissection, occlusion, or hemodynamically significant stenosis (greater than 50%). Skeleton: No acute osseous abnormality. Degenerative changes in the cervical spine. Other neck: Multiple hypoenhancing nodules in the thyroid, which measure up to 11 mm, for which no follow-up is currently indicated. (Reference: J Am Coll Radiol. 2015 Feb;12(2): 143-50) Upper chest:  No focal pulmonary opacity or pleural effusion. Review of the MIP images confirms the above findings CTA HEAD FINDINGS Anterior circulation: Both internal carotid arteries are patent to the termini, without significant stenosis. Patent left A1. Aplastic right A1. Normal anterior communicating artery. Anterior cerebral arteries are patent to their distal aspects without significant stenosis. No M1 stenosis or occlusion. MCA branches perfused to their distal aspects without significant stenosis. Posterior circulation: Vertebral arteries patent to the vertebrobasilar junction without significant stenosis. Posterior inferior cerebellar arteries patent proximally. Basilar patent to its distal aspect without significant stenosis. Superior cerebellar arteries patent proximally, duplicated on the left. Patent P1 segments. PCAs perfused to their distal aspects without significant stenosis. The bilateral posterior communicating arteries are not visualized. Venous sinuses: Patent. Anatomic variants: Aplastic right A1.  Duplicated left SCA. Review of the MIP images confirms the above findings IMPRESSION: 1. No intracranial large vessel occlusion or significant stenosis. 2. No hemodynamically significant stenosis in the neck. Electronically Signed   By: Wiliam Ke M.D.   On: 02/19/2023 21:10   CT HEAD CODE STROKE WO CONTRAST  Result Date: 02/19/2023 CLINICAL DATA:  Code stroke.  Vertigo, nausea EXAM: CT HEAD WITHOUT CONTRAST TECHNIQUE: Contiguous axial images were obtained from the base of the skull through the vertex without intravenous contrast. RADIATION DOSE REDUCTION: This exam was performed according to the departmental dose-optimization program which includes automated exposure control, adjustment of the mA and/or kV according to patient size and/or use of iterative reconstruction technique. COMPARISON:  None Available. FINDINGS: Brain: No evidence of acute infarction, hemorrhage, mass, mass effect, or midline  shift. No hydrocephalus or extra-axial collection. Vascular: No hyperdense vessel. Skull: Negative for fracture or focal lesion. Sinuses/Orbits: No acute finding. Other: The mastoid air cells are well aerated. ASPECTS [facility] Stroke Program Early CT Score) - Ganglionic level infarction (caudate, lentiform nuclei, internal capsule, insula, M1-M3 cortex): 7 - Supraganglionic infarction (M4-M6 cortex): 3 Total score (0-10 with 10 being normal): 10 IMPRESSION: No evidence of acute intracranial process. ASPECTS is 10. Code stroke imaging results were communicated on 02/19/2023 at 7:50 pm to provider [facility] via telephone, who verbally acknowledged these results. Electronically Signed   By: Wiliam Ke M.D.   On: 02/19/2023 19:52    Procedures .Critical Care  Performed by: Ernie Avena, MD Authorized by: Ernie Avena, MD   Critical care provider statement:    Critical care time (minutes):  30   Critical care was necessary to treat or prevent imminent or life-threatening deterioration of the following conditions:  CNS failure or compromise   Critical care was time spent personally by me on the following activities:  Development of treatment plan with patient or surrogate, discussions with consultants, evaluation of patient's response to treatment, examination of patient, ordering and review of laboratory studies, ordering  and review of radiographic studies, ordering and performing treatments and interventions, pulse oximetry, re-evaluation of patient's condition and review of old charts   Care discussed with: admitting provider       Medications Ordered in ED Medications  aspirin EC tablet 81 mg (81 mg Oral Given 02/19/23 2135)  clopidogrel (PLAVIX) tablet 300 mg (300 mg Oral Given 02/19/23 2135)    Followed by  clopidogrel (PLAVIX) tablet 75 mg (has no administration in time range)   stroke: early stages of recovery book (has no administration in time range)  meclizine (ANTIVERT) tablet 25 mg (25  mg Oral Given 02/19/23 2135)  ondansetron (ZOFRAN) injection 4 mg (4 mg Intravenous Given 02/19/23 2012)  iohexol (OMNIPAQUE) 350 MG/ML injection 100 mL (75 mLs Intravenous Contrast Given 02/19/23 2048)    ED Course/ Medical Decision Making/ A&P                             Medical Decision Making Amount and/or Complexity of Data Reviewed Labs: ordered. Radiology: ordered.  Risk Prescription drug management. Decision regarding hospitalization.     77 year old female with medical history significant for GERD, HTN, HLD, obesity, anemia, cataracts, reported history of vertigo years ago who presents to the emergency department with sudden onset vertiginous symptoms.  The patient states that around 4:30 PM she had sudden onset of room spinning vertigo.  She has been able to walk.  She denies any headaches, fevers or chills.  She denies any specific ear pain.  Endorses mild nausea.  No facial droop, no difficulty swallowing or speaking, no focal weakness or numbness.  On arrival, the patient was vitally stable, initial CBG 113.  Physical exam concerning for central etiology of the patient's vertigo with a normal head impulse test, vertical nystagmus and bidirectional nystagmus present on exam.  Patient presenting with acute onset vertiginous symptoms.  Code stroke called on my evaluation in triage.  Patient last normal at 4:30 PM, within the window for TNK.  TNK administration deferred to on-call teleneurology.  Per neurology, the patient stated that she felt off as early as 3:30 PM but possibly as early as 3 PM and therefore she would be outside the window for TNK.  Code Head code stroke: Negative  CTA Head and Neck: IMPRESSION:  1. No intracranial large vessel occlusion or significant stenosis.  2. No hemodynamically significant stenosis in the neck.   Dr. Iver Nestle recommended admission for further stroke workup, MRI brain.  Hospitalist medicine was consulted for admission, Dr. Haroldine Laws  accepting.   Final Clinical Impression(s) / ED Diagnoses Final diagnoses:  Vertigo  Stroke-like symptoms    Rx / DC Orders ED Discharge Orders     None           Ernie Avena, MD 02/19/23 2142

## 2023-02-19 NOTE — ED Notes (Signed)
MD notified of bradycardia. EDP at bedside for eval. Pt remains asymptomatic of low HR.

## 2023-02-19 NOTE — ED Notes (Signed)
Pt transported to CT by this float RN.

## 2023-02-20 ENCOUNTER — Observation Stay (HOSPITAL_COMMUNITY): Payer: Medicare PPO

## 2023-02-20 ENCOUNTER — Other Ambulatory Visit: Payer: Self-pay | Admitting: Neurology

## 2023-02-20 ENCOUNTER — Observation Stay (HOSPITAL_BASED_OUTPATIENT_CLINIC_OR_DEPARTMENT_OTHER): Payer: Medicare PPO

## 2023-02-20 DIAGNOSIS — I5030 Unspecified diastolic (congestive) heart failure: Secondary | ICD-10-CM

## 2023-02-20 DIAGNOSIS — M545 Low back pain, unspecified: Secondary | ICD-10-CM | POA: Diagnosis not present

## 2023-02-20 DIAGNOSIS — N1831 Chronic kidney disease, stage 3a: Secondary | ICD-10-CM

## 2023-02-20 DIAGNOSIS — N183 Chronic kidney disease, stage 3 unspecified: Secondary | ICD-10-CM | POA: Diagnosis present

## 2023-02-20 DIAGNOSIS — G8929 Other chronic pain: Secondary | ICD-10-CM

## 2023-02-20 DIAGNOSIS — E1122 Type 2 diabetes mellitus with diabetic chronic kidney disease: Secondary | ICD-10-CM | POA: Diagnosis not present

## 2023-02-20 DIAGNOSIS — R001 Bradycardia, unspecified: Secondary | ICD-10-CM

## 2023-02-20 DIAGNOSIS — K219 Gastro-esophageal reflux disease without esophagitis: Secondary | ICD-10-CM

## 2023-02-20 DIAGNOSIS — E118 Type 2 diabetes mellitus with unspecified complications: Secondary | ICD-10-CM

## 2023-02-20 DIAGNOSIS — E782 Mixed hyperlipidemia: Secondary | ICD-10-CM | POA: Diagnosis not present

## 2023-02-20 DIAGNOSIS — H8111 Benign paroxysmal vertigo, right ear: Secondary | ICD-10-CM

## 2023-02-20 DIAGNOSIS — R42 Dizziness and giddiness: Secondary | ICD-10-CM

## 2023-02-20 DIAGNOSIS — I10 Essential (primary) hypertension: Secondary | ICD-10-CM | POA: Diagnosis not present

## 2023-02-20 DIAGNOSIS — R29818 Other symptoms and signs involving the nervous system: Secondary | ICD-10-CM | POA: Diagnosis not present

## 2023-02-20 DIAGNOSIS — I129 Hypertensive chronic kidney disease with stage 1 through stage 4 chronic kidney disease, or unspecified chronic kidney disease: Secondary | ICD-10-CM | POA: Diagnosis not present

## 2023-02-20 DIAGNOSIS — H8110 Benign paroxysmal vertigo, unspecified ear: Secondary | ICD-10-CM

## 2023-02-20 DIAGNOSIS — D649 Anemia, unspecified: Secondary | ICD-10-CM

## 2023-02-20 DIAGNOSIS — Z79899 Other long term (current) drug therapy: Secondary | ICD-10-CM | POA: Diagnosis not present

## 2023-02-20 LAB — CBC WITH DIFFERENTIAL/PLATELET
Abs Immature Granulocytes: 0.02 10*3/uL (ref 0.00–0.07)
Basophils Absolute: 0 10*3/uL (ref 0.0–0.1)
Basophils Relative: 1 %
Eosinophils Absolute: 0.2 10*3/uL (ref 0.0–0.5)
Eosinophils Relative: 3 %
HCT: 35.7 % — ABNORMAL LOW (ref 36.0–46.0)
Hemoglobin: 11.4 g/dL — ABNORMAL LOW (ref 12.0–15.0)
Immature Granulocytes: 0 %
Lymphocytes Relative: 25 %
Lymphs Abs: 2 10*3/uL (ref 0.7–4.0)
MCH: 27.8 pg (ref 26.0–34.0)
MCHC: 31.9 g/dL (ref 30.0–36.0)
MCV: 87.1 fL (ref 80.0–100.0)
Monocytes Absolute: 0.6 10*3/uL (ref 0.1–1.0)
Monocytes Relative: 7 %
Neutro Abs: 5 10*3/uL (ref 1.7–7.7)
Neutrophils Relative %: 64 %
Platelets: 223 10*3/uL (ref 150–400)
RBC: 4.1 MIL/uL (ref 3.87–5.11)
RDW: 13.1 % (ref 11.5–15.5)
WBC: 7.8 10*3/uL (ref 4.0–10.5)
nRBC: 0 % (ref 0.0–0.2)

## 2023-02-20 LAB — HEMOGLOBIN AND HEMATOCRIT, BLOOD
HCT: 37 % (ref 36.0–46.0)
Hemoglobin: 12 g/dL (ref 12.0–15.0)

## 2023-02-20 LAB — COMPREHENSIVE METABOLIC PANEL
ALT: 16 U/L (ref 0–44)
AST: 15 U/L (ref 15–41)
Albumin: 2.9 g/dL — ABNORMAL LOW (ref 3.5–5.0)
Alkaline Phosphatase: 82 U/L (ref 38–126)
Anion gap: 9 (ref 5–15)
BUN: 20 mg/dL (ref 8–23)
CO2: 25 mmol/L (ref 22–32)
Calcium: 9.1 mg/dL (ref 8.9–10.3)
Chloride: 107 mmol/L (ref 98–111)
Creatinine, Ser: 1.12 mg/dL — ABNORMAL HIGH (ref 0.44–1.00)
GFR, Estimated: 50 mL/min — ABNORMAL LOW (ref >60)
Glucose, Bld: 93 mg/dL (ref 70–99)
Potassium: 4.1 mmol/L (ref 3.5–5.1)
Sodium: 141 mmol/L (ref 135–145)
Total Bilirubin: 0.6 mg/dL (ref 0.3–1.2)
Total Protein: 5.3 g/dL — ABNORMAL LOW (ref 6.5–8.1)

## 2023-02-20 LAB — ECHOCARDIOGRAM COMPLETE
Area-P 1/2: 3.36 cm2
Calc EF: 64.3 %
S' Lateral: 3.2 cm
Single Plane A2C EF: 62.5 %
Single Plane A4C EF: 66.3 %

## 2023-02-20 LAB — MAGNESIUM: Magnesium: 1.7 mg/dL (ref 1.7–2.4)

## 2023-02-20 LAB — HEMOGLOBIN A1C
Hgb A1c MFr Bld: 6.4 % — ABNORMAL HIGH (ref 4.8–5.6)
Mean Plasma Glucose: 137 mg/dL

## 2023-02-20 LAB — LIPID PANEL
Cholesterol: 137 mg/dL (ref 0–200)
HDL: 45 mg/dL (ref >40)
LDL Cholesterol: 67 mg/dL (ref 0–99)
Total CHOL/HDL Ratio: 3 RATIO
Triglycerides: 127 mg/dL (ref <150)
VLDL: 25 mg/dL (ref 0–40)

## 2023-02-20 LAB — TSH: TSH: 1.092 u[IU]/mL (ref 0.350–4.500)

## 2023-02-20 MED ORDER — ENOXAPARIN SODIUM 40 MG/0.4ML IJ SOSY
40.0000 mg | PREFILLED_SYRINGE | INTRAMUSCULAR | Status: DC
  Administered 2023-02-20: 40 mg via SUBCUTANEOUS
  Filled 2023-02-20: qty 0.4

## 2023-02-20 MED ORDER — ALBUTEROL SULFATE (2.5 MG/3ML) 0.083% IN NEBU: 2.5000 mg | INHALATION_SOLUTION | Freq: Four times a day (QID) | RESPIRATORY_TRACT | Status: DC | PRN

## 2023-02-20 MED ORDER — PERFLUTREN LIPID MICROSPHERE
1.0000 mL | INTRAVENOUS | Status: AC | PRN
  Administered 2023-02-20: 2 mL via INTRAVENOUS

## 2023-02-20 MED ORDER — ACETAMINOPHEN 325 MG PO TABS: 650.0000 mg | ORAL_TABLET | Freq: Four times a day (QID) | ORAL | Status: DC | PRN

## 2023-02-20 MED ORDER — ROSUVASTATIN CALCIUM 20 MG PO TABS
20.0000 mg | ORAL_TABLET | Freq: Every day | ORAL | Status: DC
  Administered 2023-02-20: 20 mg via ORAL
  Filled 2023-02-20: qty 1

## 2023-02-20 MED ORDER — SODIUM CHLORIDE 0.9% FLUSH
3.0000 mL | Freq: Two times a day (BID) | INTRAVENOUS | Status: DC
  Administered 2023-02-20: 3 mL via INTRAVENOUS

## 2023-02-20 MED ORDER — ACETAMINOPHEN 650 MG RE SUPP: 650.0000 mg | Freq: Four times a day (QID) | RECTAL | Status: DC | PRN

## 2023-02-20 MED ORDER — MECLIZINE HCL 25 MG PO TABS: 25.0000 mg | ORAL_TABLET | Freq: Three times a day (TID) | ORAL | 0 refills | Status: DC | PRN

## 2023-02-20 MED ORDER — ASPIRIN 81 MG PO TBEC: 81.0000 mg | DELAYED_RELEASE_TABLET | Freq: Every day | ORAL | 12 refills | Status: AC

## 2023-02-20 MED ORDER — ONDANSETRON HCL 4 MG/2ML IJ SOLN: 4.0000 mg | Freq: Four times a day (QID) | INTRAMUSCULAR | Status: DC | PRN

## 2023-02-20 MED ORDER — ONDANSETRON HCL 4 MG PO TABS: 4.0000 mg | ORAL_TABLET | Freq: Four times a day (QID) | ORAL | Status: DC | PRN

## 2023-02-20 MED ORDER — MELATONIN 3 MG PO TABS: 3.0000 mg | ORAL_TABLET | Freq: Every evening | ORAL | Status: DC | PRN

## 2023-02-20 MED ORDER — HYDROCODONE-ACETAMINOPHEN 10-325 MG PO TABS: 1.0000 | ORAL_TABLET | Freq: Four times a day (QID) | ORAL | Status: DC | PRN

## 2023-02-20 MED ORDER — PANTOPRAZOLE SODIUM 40 MG PO TBEC
40.0000 mg | DELAYED_RELEASE_TABLET | Freq: Every day | ORAL | Status: DC
  Administered 2023-02-20: 40 mg via ORAL
  Filled 2023-02-20: qty 1

## 2023-02-20 NOTE — Progress Notes (Signed)
OT Cancellation Note  Patient Details Name: Sarah Benton MRN: [id number] DOB: 1946-06-01   Cancelled Treatment:    Reason Eval/Treat Not Completed: OT screened, no needs identified, will sign off: Pt screened via chart review and interview with pt and pt's sister. Pt reports feeling back to baseline with exception from expected fatigue from earlier symptoms and hospitalization. Pt showing intact BUE ROM, MMT and dexterity. Pt reports no concerns for return home and has a good support system between her sister and her son.  Pt agreeable to OT sign off.   Theodoro Clock 02/20/2023, 2:19 PM

## 2023-02-20 NOTE — Progress Notes (Signed)
STROKE TEAM PROGRESS NOTE   SUBJECTIVE (INTERVAL HISTORY) Her sister is at the bedside.  Overall her condition is completely resolved.  Patient stated that she had recurrent vertigo episodes, this admission was her 4th vertigo episodes.  2 happened last year, 1 was last week.  Each time patient was getting up from recliner to standing up position, lasting a day or 2 and resolved.  Associate with nausea.  Denies any headache, ear ringing.  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   OBJECTIVE Temp:  [97.7 F (36.5 C)-98.5 F (36.9 C)] 98.4 F (36.9 C) (05/30 1604) Pulse Rate:  [40-65] 53 (05/30 1604) Cardiac Rhythm: Sinus bradycardia (05/30 0800) Resp:  [15-20] 19 (05/30 1604) BP: (104-144)/(47-73) 123/47 (05/30 1604) SpO2:  [93 %-99 %] 95 % (05/30 1604)  Recent Labs  Lab 02/19/23 1901  GLUCAP 113*   Recent Labs  Lab 02/19/23 1950 02/20/23 0424  NA 140 141  K 3.9 4.1  CL 104 107  CO2 24 25  GLUCOSE 111* 93  BUN 24* 20  CREATININE 1.05* 1.12*  CALCIUM 9.6 9.1  MG  --  1.7   Recent Labs  Lab 02/19/23 1950 02/20/23 0424  AST 16 15  ALT 16 16  ALKPHOS 96 82  BILITOT 0.5 0.6  PROT 6.7 5.3*  ALBUMIN 4.2 2.9*   Recent Labs  Lab 02/19/23 1950 02/20/23 0424 02/20/23 1157  WBC 11.7* 7.8  --   NEUTROABS 9.1* 5.0  --   HGB 12.6 11.4* 12.0  HCT 38.4 35.7* 37.0  MCV 85.1 87.1  --   PLT 258 223  --    No results for input(s): "CKTOTAL", "CKMB", "CKMBINDEX", "TROPONINI" in the last 168 hours. Recent Labs    02/19/23 1950  LABPROT 13.0  INR 1.0   Recent Labs    02/19/23 1933  COLORURINE COLORLESS*  LABSPEC 1.024  PHURINE 6.5  GLUCOSEU NEGATIVE  HGBUR NEGATIVE  BILIRUBINUR NEGATIVE  KETONESUR TRACE*  PROTEINUR NEGATIVE  NITRITE NEGATIVE  LEUKOCYTESUR NEGATIVE       Component Value Date/Time   CHOL 137 02/20/2023 0424   CHOL 160 12/10/2022 0845   CHOL 169 12/07/2012 1103   TRIG 127 02/20/2023 0424    TRIG 117 01/25/2015 1003   TRIG 137 12/07/2012 1103   HDL 45 02/20/2023 0424   HDL 51 12/10/2022 0845   HDL 55 01/25/2015 1003   HDL 49 12/07/2012 1103   CHOLHDL 3.0 02/20/2023 0424   VLDL 25 02/20/2023 0424   LDLCALC 67 02/20/2023 0424   LDLCALC 87 12/10/2022 0845   LDLCALC 85 01/07/2014 1427   LDLCALC 93 12/07/2012 1103   Lab Results  Component Value Date   HGBA1C 5.9 (H) 12/10/2022      Component Value Date/Time   LABOPIA NONE DETECTED 02/19/2023 1933   COCAINSCRNUR NONE DETECTED 02/19/2023 1933   LABBENZ NONE DETECTED 02/19/2023 1933   AMPHETMU NONE DETECTED 02/19/2023 1933   THCU NONE DETECTED 02/19/2023 1933   LABBARB NONE DETECTED 02/19/2023 1933    Recent Labs  Lab 02/19/23 1950  ETH <10    I have personally reviewed the radiological images below and agree with the radiology interpretations.  ECHOCARDIOGRAM COMPLETE  Result Date: 02/20/2023    ECHOCARDIOGRAM REPORT   Patient Name:   Sarah Benton Date of Exam: 02/20/2023 Medical Rec #:  [id number]         Height:       62.4  in Accession #:    [id number]        Weight:       193.0 lb Date of Birth:  [birth year]         BSA:          1.892 m Patient Age:    77 years          BP:           144/53 mmHg Patient Gender: F                 HR:           53 bpm. Exam Location:  Inpatient Procedure: 2D Echo, Cardiac Doppler, Color Doppler and Intracardiac            Opacification Agent Indications:    Stroke  History:        Patient has no prior history of Echocardiogram examinations.                 TIA; Risk Factors:Hypertension, Dyslipidemia and Diabetes.  Sonographer:    Sheralyn Boatman RDCS Referring Phys: [id number] Gordy Councilman  Sonographer Comments: Patient is obese and Technically difficult study due to poor echo windows. Image acquisition challenging due to patient body habitus. IMPRESSIONS  1. Left ventricular ejection fraction, by estimation, is 60 to 65%. The left ventricle has normal function. The left ventricle has no  regional wall motion abnormalities. Left ventricular diastolic parameters are consistent with Grade I diastolic dysfunction (impaired relaxation).  2. Right ventricular systolic function is normal. The right ventricular size is normal.  3. The mitral valve is grossly normal. Trivial mitral valve regurgitation.  4. The aortic valve is tricuspid. Aortic valve regurgitation is trivial. Aortic valve sclerosis/calcification is present, without any evidence of aortic stenosis.  5. The inferior vena cava is normal in size with greater than 50% respiratory variability, suggesting right atrial pressure of 3 mmHg. Comparison(s): No prior Echocardiogram. FINDINGS  Left Ventricle: Left ventricular ejection fraction, by estimation, is 60 to 65%. The left ventricle has normal function. The left ventricle has no regional wall motion abnormalities. Definity contrast agent was given IV to delineate the left ventricular  endocardial borders. The left ventricular internal cavity size was normal in size. There is no left ventricular hypertrophy. Left ventricular diastolic parameters are consistent with Grade I diastolic dysfunction (impaired relaxation). Right Ventricle: The right ventricular size is normal. Right vetricular wall thickness was not well visualized. Right ventricular systolic function is normal. Left Atrium: Left atrial size was normal in size. Right Atrium: Right atrial size was normal in size. Pericardium: There is no evidence of pericardial effusion. Mitral Valve: The mitral valve is grossly normal. Trivial mitral valve regurgitation. Tricuspid Valve: The tricuspid valve is normal in structure. Tricuspid valve regurgitation is trivial. Aortic Valve: The aortic valve is tricuspid. Aortic valve regurgitation is trivial. Aortic valve sclerosis/calcification is present, without any evidence of aortic stenosis. Pulmonic Valve: The pulmonic valve was grossly normal. Pulmonic valve regurgitation is not visualized. Aorta: The  aortic root is normal in size and structure. Venous: The inferior vena cava is normal in size with greater than 50% respiratory variability, suggesting right atrial pressure of 3 mmHg. IAS/Shunts: The atrial septum is grossly normal.  LEFT VENTRICLE PLAX 2D LVIDd:         4.70 cm      Diastology LVIDs:         3.20 cm      LV e' medial:  8.16 cm/s LV PW:         1.30 cm      LV E/e' medial:  10.3 LV IVS:        1.20 cm      LV e' lateral:   6.64 cm/s LVOT diam:     2.00 cm      LV E/e' lateral: 12.7 LV SV:         95 LV SV Index:   50 LVOT Area:     3.14 cm  LV Volumes (MOD) LV vol d, MOD A2C: 108.4 ml LV vol d, MOD A4C: 91.7 ml LV vol s, MOD A2C: 40.7 ml LV vol s, MOD A4C: 30.9 ml LV SV MOD A2C:     67.8 ml LV SV MOD A4C:     91.7 ml LV SV MOD BP:      64.2 ml RIGHT VENTRICLE             IVC RV S prime:     14.70 cm/s  IVC diam: 1.60 cm TAPSE (M-mode): 2.5 cm LEFT ATRIUM             Index        RIGHT ATRIUM           Index LA diam:        3.00 cm 1.59 cm/m   RA Area:     16.30 cm LA Vol (A2C):   36.4 ml 19.24 ml/m  RA Volume:   45.20 ml  23.89 ml/m LA Vol (A4C):   17.9 ml 9.46 ml/m LA Biplane Vol: 25.2 ml 13.32 ml/m  AORTIC VALVE LVOT Vmax:   129.00 cm/s LVOT Vmean:  81.700 cm/s LVOT VTI:    0.301 m  AORTA Ao Root diam: 3.40 cm Ao Asc diam:  3.10 cm MITRAL VALVE MV Area (PHT): 3.36 cm     SHUNTS MV Decel Time: 226 msec     Systemic VTI:  0.30 m MV E velocity: 84.15 cm/s   Systemic Diam: 2.00 cm MV A velocity: 102.35 cm/s MV E/A ratio:  0.82 Laurance Flatten MD Electronically signed by Laurance Flatten MD Signature Date/Time: 02/20/2023/12:14:40 PM    Final    MR BRAIN WO CONTRAST  Result Date: 02/20/2023 CLINICAL DATA:  77 year old female code stroke presentation yesterday. Vertigo. EXAM: MRI HEAD WITHOUT CONTRAST TECHNIQUE: Multiplanar, multiecho pulse sequences of the brain and surrounding structures were obtained without intravenous contrast. COMPARISON:  CT head, CTA head and neck yesterday.  FINDINGS: Brain: No restricted diffusion to suggest acute infarction. No midline shift, mass effect, evidence of mass lesion, ventriculomegaly, extra-axial collection or acute intracranial hemorrhage. Cervicomedullary junction and pituitary are within normal limits. Wallace Cullens and white matter signal is within normal limits for age throughout the brain. Minimal nonspecific white matter T2/FLAIR signal changes. No convincing cortical encephalomalacia or chronic cerebral blood products. Cerebral volume is within normal limits for age. Vascular: Major intracranial vascular flow voids are preserved. Skull and upper cervical spine: Negative. Visualized bone marrow signal is within normal limits. Sinuses/Orbits: Rightward gaze, but otherwise normal orbits soft tissues. Paranasal sinuses are stable and well aerated. Other: Grossly normal visible internal auditory structures aside from trace mastoid fluid which appears inconsequential. Normal stylomastoid foramina. Negative visible scalp and face. IMPRESSION: No acute intracranial abnormality. Normal for age noncontrast MRI appearance of the brain. Electronically Signed   By: Odessa Fleming M.D.   On: 02/20/2023 07:39   CT ANGIO HEAD NECK W WO CM (CODE STROKE)  Result Date: 02/19/2023 CLINICAL DATA:  Vertigo, nausea EXAM: CT ANGIOGRAPHY HEAD AND NECK WITH AND WITHOUT CONTRAST TECHNIQUE: Multidetector CT imaging of the head and neck was performed using the standard protocol during bolus administration of intravenous contrast. Multiplanar CT image reconstructions and MIPs were obtained to evaluate the vascular anatomy. Carotid stenosis measurements (when applicable) are obtained utilizing NASCET criteria, using the distal internal carotid diameter as the denominator. RADIATION DOSE REDUCTION: This exam was performed according to the departmental dose-optimization program which includes automated exposure control, adjustment of the mA and/or kV according to patient size and/or use of  iterative reconstruction technique. CONTRAST:  75mL OMNIPAQUE IOHEXOL 350 MG/ML SOLN COMPARISON:  No prior CTA available, correlation is made with 02/19/2023 CT head FINDINGS: CT HEAD FINDINGS For noncontrast findings, please see same day CT head. CTA NECK FINDINGS Aortic arch: Standard branching. Imaged portion shows no evidence of aneurysm or dissection. No significant stenosis of the major arch vessel origins. Right carotid system: No evidence of dissection, occlusion, or hemodynamically significant stenosis (greater than 50%). Left carotid system: No evidence of dissection, occlusion, or hemodynamically significant stenosis (greater than 50%). Vertebral arteries: No evidence of dissection, occlusion, or hemodynamically significant stenosis (greater than 50%). Skeleton: No acute osseous abnormality. Degenerative changes in the cervical spine. Other neck: Multiple hypoenhancing nodules in the thyroid, which measure up to 11 mm, for which no follow-up is currently indicated. (Reference: J Am Coll Radiol. 2015 Feb;12(2): 143-50) Upper chest: No focal pulmonary opacity or pleural effusion. Review of the MIP images confirms the above findings CTA HEAD FINDINGS Anterior circulation: Both internal carotid arteries are patent to the termini, without significant stenosis. Patent left A1. Aplastic right A1. Normal anterior communicating artery. Anterior cerebral arteries are patent to their distal aspects without significant stenosis. No M1 stenosis or occlusion. MCA branches perfused to their distal aspects without significant stenosis. Posterior circulation: Vertebral arteries patent to the vertebrobasilar junction without significant stenosis. Posterior inferior cerebellar arteries patent proximally. Basilar patent to its distal aspect without significant stenosis. Superior cerebellar arteries patent proximally, duplicated on the left. Patent P1 segments. PCAs perfused to their distal aspects without significant  stenosis. The bilateral posterior communicating arteries are not visualized. Venous sinuses: Patent. Anatomic variants: Aplastic right A1.  Duplicated left SCA. Review of the MIP images confirms the above findings IMPRESSION: 1. No intracranial large vessel occlusion or significant stenosis. 2. No hemodynamically significant stenosis in the neck. Electronically Signed   By: Wiliam Ke M.D.   On: 02/19/2023 21:10   CT HEAD CODE STROKE WO CONTRAST  Result Date: 02/19/2023 CLINICAL DATA:  Code stroke.  Vertigo, nausea EXAM: CT HEAD WITHOUT CONTRAST TECHNIQUE: Contiguous axial images were obtained from the base of the skull through the vertex without intravenous contrast. RADIATION DOSE REDUCTION: This exam was performed according to the departmental dose-optimization program which includes automated exposure control, adjustment of the mA and/or kV according to patient size and/or use of iterative reconstruction technique. COMPARISON:  None Available. FINDINGS: Brain: No evidence of acute infarction, hemorrhage, mass, mass effect, or midline shift. No hydrocephalus or extra-axial collection. Vascular: No hyperdense vessel. Skull: Negative for fracture or focal lesion. Sinuses/Orbits: No acute finding. Other: The mastoid air cells are well aerated. ASPECTS [facility] Stroke Program Early CT Score) - Ganglionic level infarction (caudate, lentiform nuclei, internal capsule, insula, M1-M3 cortex): 7 - Supraganglionic infarction (M4-M6 cortex): 3 Total score (0-10 with 10 being normal): 10 IMPRESSION: No evidence of acute intracranial process. ASPECTS is 10. Code stroke  imaging results were communicated on 02/19/2023 at 7:50 pm to provider [facility] via telephone, who verbally acknowledged these results. Electronically Signed   By: Wiliam Ke M.D.   On: 02/19/2023 19:52     PHYSICAL EXAM  Temp:  [97.7 F (36.5 C)-98.5 F (36.9 C)] 98.4 F (36.9 C) (05/30 1604) Pulse Rate:  [40-65] 53 (05/30 1604) Resp:   [15-20] 19 (05/30 1604) BP: (104-144)/(47-73) 123/47 (05/30 1604) SpO2:  [93 %-99 %] 95 % (05/30 1604)  General - Well nourished, well developed, in no apparent distress.  Ophthalmologic - fundi not visualized due to noncooperation.  Cardiovascular - Regular rhythm and rate.  Mental Status -  Level of arousal and orientation to time, place, and person were intact. Language including expression, naming, repetition, comprehension was assessed and found intact. Fund of Knowledge was assessed and was intact.  Cranial Nerves II - XII - II - Visual field intact OU. III, IV, VI - Extraocular movements intact. V - Facial sensation intact bilaterally. VII - Facial movement intact bilaterally. VIII - Hearing & vestibular intact bilaterally. X - Palate elevates symmetrically. XI - Chin turning & shoulder shrug intact bilaterally. XII - Tongue protrusion intact.  Motor Strength - The patient's strength was normal in all extremities and pronator drift was absent.  Bulk was normal and fasciculations were absent.   Motor Tone - Muscle tone was assessed at the neck and appendages and was normal.  Reflexes - The patient's reflexes were symmetrical in all extremities and she had no pathological reflexes.  Sensory - Light touch, temperature/pinprick were assessed and were symmetrical.    Coordination - The patient had normal movements in the hands and feet with no ataxia or dysmetria.  Tremor was absent.  Gait and Station - deferred.   ASSESSMENT/PLAN Ms. SOLOMIYA GAIER is a 77 y.o. female with history of hypertension, hyperlipidemia, diabetes, obesity, lower back pain, recurrent vertigo admitted for vertigo episode. No tPA given due to symptom.    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  Each time occurred getting up from recliner. 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 Patient counseled to be compliant with her antithrombotic medications Ongoing aggressive stroke risk factor management Therapy recommendations: Outpatient vestibular PT Disposition: Home today  Diabetes HgbA1c pending goal < 7.0 Controlled CBG monitoring SSI Dclose PCP follow up  Hypertension Stable Avoid low BP Long term BP goal normotensive  Hyperlipidemia Home meds: Crestor 20 LDL 67, goal < 70 Now on Crestor 20 Continue statin at discharge  Other Stroke Risk Factors Advanced age Obesity  Other Active Problems Lower back pain Leukocytosis WBC 11.7-7.8  Hospital day # 0  Neurology will sign off. Please call with questions. Pt will follow up with stroke clinic NP at [facility] in about 4 weeks. Thanks for the consult.   Marvel Plan, MD PhD Stroke Neurology 02/20/2023 8:33 PM    To contact Stroke Continuity provider, please refer to [email or web address]. After hours, contact General Neurology

## 2023-02-20 NOTE — Discharge Summary (Signed)
Physician Discharge Summary   Patient: Sarah Benton MRN: [id number] DOB: Mar 05, 1946  Admit date:     02/19/2023  Discharge date: 02/20/23  Discharge Physician: Clydie Braun   PCP: Bennie Pierini, FNP   Recommendations at discharge:   Bradycardia patient was noted to have heart rates in the 40s to 50s but otherwise noted to be asymptomatic with blood pressures maintained.  May warrant outpatient Holter monitor or referral to cardiology Recommend repeat checking orthostatics in outpatient setting. Discharge Diagnoses: Principal Problem:   BPPV (benign paroxysmal positional vertigo) Active Problems:   Sinus bradycardia   Normocytic anemia   Controlled diabetes mellitus type 2 with complications (HCC)   CKD (chronic kidney disease), stage III (HCC)   Mixed hyperlipidemia   Chronic back pain   GERD   Hospital Course: Sarah Benton is a 77 y.o. female with medical history significant of hypertension, hyperlipidemia, iron deficiency anemia, arthritis, esophageal stricture, GERD, and obesity who presents with complaints of dizziness.  Symptoms started yesterday afternoon while the patient was trying to stand up.  She reported feeling lightheaded, but did not feel like the room was moving around her.  She did report getting nauseous and vomited.  She had tried taking a old prescription that she had from meclizine when this had happened previously in the past, but states that symptoms did not improve.  Normally patient is able to ambulate without need of assistance.  Patient denies having any recent fevers, abdominal pain, cough, shortness of breath.   On admission to the emergency department patient noted to be afebrile with pulse 40-65, and all other vital signs maintained.  CT scan of the head did not note any acute abnormality.  CTA of the head and neck did not note any large vessel occlusion.  ED provider at [facility] discussed case with on-call neurology, Dr. Iver Nestle, who  conveyed that presentation was outside of window for consideration for tPA.  Patient had been given meclizine, Plavix, and aspirin TRH placed orders to admit to a telemetry bed at [facility] for further TIA/stroke workup.  MRI of brain did not note any acute.  Neurology felt symptoms less likely related to her TIA/stroke and more likely secondary to vertigo.   Assessment and Plan: Suspected BPPV  Patient presented with complaints of dizziness as though she could pass out with standing starting yesterday afternoon.  She tried taking old prescription of meclizine without improvement.  CT/CTA of the head and neck did not note any acute abnormality.  MRI of the brain was negative.  Echocardiogram noted preserved heart function.  Orthostatic vital signs were noted to positive with standing after 3 minutes.Marland Kitchen  TSH within normal limits.  Physical therapy was not able to elicit nystagmus with testing, but patient did have positive head thrust test to the right.  Referral had been placed for ambulatory vestibular rehab.  Bradycardia Acute.  Heart rates were noted 40-65.  She is not on any rate controlling medications.  Blood pressures otherwise maintained. -May warrant Holter monitor/referral to cardiology in the outpatient setting   Normocytic Anemia Hemoglobin 11.4 g/dL.  Baseline hemoglobin around 12 where recheck was noted to be.  No reports of bleeding.   Diabetes mellitus type 2, without long-term use of insulin Hemoglobin A1c was noted to be 5.9.  Patient had been on Ozempic previously, but discontinued medicine due to it making her feel sick. -Carb modified diet  Essential hypertension -Continue home blood pressure regimen   Chronic kidney disease stage  IIIa Creatinine 1.12 which appears around patient's baseline creatinine. -Continue to monitor   Hyperlipidemia -Continue Crestor   Back pain Chronic.  Patient has a history of chronic lower back pain from ruptured disc. Home medication  regimen includes Mobic.   GERD -Pharmacy substitution for omeprazole        Consultants: Neurology Procedures performed: None Disposition: Home Diet recommendation:  Discharge Diet Orders (From admission, onward)     Start     Ordered   02/20/23 0000  Diet - low sodium heart healthy        02/20/23 1654   02/20/23 0000  Diet Carb Modified        02/20/23 1654           DISCHARGE MEDICATION: Allergies as of 02/20/2023       Reactions   Amoxicillin Diarrhea   Crestor [rosuvastatin Calcium]    Leg cramps   Septra [sulfamethoxazole-trimethoprim]    Nausea    Sulfamethoxazole-trimethoprim Other (See Comments)   Atorvastatin Other (See Comments)   Myalgias / muscle aches        Medication List     STOP taking these medications    Ozempic (0.25 or 0.5 MG/DOSE) 2 MG/3ML Sopn Generic drug: Semaglutide(0.25 or 0.5MG /DOS)       TAKE these medications    Accu-Chek Guide w/Device Kit USE UP TO FOUR TIMES DAILY AS DIRECTED Dx E11.9   aspirin EC 81 MG tablet Take 1 tablet (81 mg total) by mouth daily. Swallow whole. Start taking on: Feb 21, 2023   Co Q-10 200 MG Caps Take 200 mg by mouth daily.   HYDROcodone-acetaminophen 10-325 MG tablet Commonly known as: NORCO Take 1 tablet by mouth every 6 (six) hours as needed.   ketoconazole 2%-triamcinolone 0.1% 1:2 cream mixture Apply topically 2 (two) times daily as needed.   lisinopril-hydrochlorothiazide 20-12.5 MG tablet Commonly known as: ZESTORETIC Take 1 tablet by mouth daily.   meclizine 25 MG tablet Commonly known as: ANTIVERT Take 1 tablet (25 mg total) by mouth 3 (three) times daily as needed for dizziness.   meloxicam 15 MG tablet Commonly known as: MOBIC TAKE ONE TABLET DAILY   omeprazole 20 MG capsule Commonly known as: PRILOSEC Take 1 capsule (20 mg total) by mouth daily.   rosuvastatin 20 MG tablet Commonly known as: CRESTOR Take 1 tablet (20 mg total) by mouth daily.   Vitamin D3 50  MCG (2000 UT) capsule Take 2,000 Units by mouth daily.        Discharge Exam: There were no vitals filed for this visit. Constitutional: Elderly female who appears to be in no acute distress at this time Eyes: PERRL, mild horizontal nystagmus noted when testing visual fields to the right ENMT: Mucous membranes are moist. Posterior pharynx clear of any exudate or lesions. Neck: normal, supple  Respiratory: clear to auscultation bilaterally, no wheezing, no crackles. Normal respiratory effort. No accessory muscle use.  Cardiovascular: Regular rate and rhythm, no murmurs / rubs / gallops. No extremity edema. Abdomen: no tenderness, no masses palpated.  Musculoskeletal: no clubbing / cyanosis. No joint deformity upper and lower extremities. Good ROM, no contractures. Normal muscle tone.  Skin: no rashes, lesions, ulcers. No induration Neurologic: CN 2-12 grossly intact.  Strength 5/5 in all 4.  Psychiatric: Normal judgment and insight. Alert and oriented x 3. Normal mood.   Condition at discharge: stable  The results of significant diagnostics from this hospitalization (including imaging, microbiology, ancillary and laboratory) are listed below for  reference.   Imaging Studies: ECHOCARDIOGRAM COMPLETE  Result Date: 02/20/2023    ECHOCARDIOGRAM REPORT   Patient Name:   GREYLYNN MILAND Date of Exam: 02/20/2023 Medical Rec #:  [id number]         Height:       62.4 in Accession #:    [id number]        Weight:       193.0 lb Date of Birth:  02-12-1946         BSA:          1.892 m Patient Age:    77 years          BP:           144/53 mmHg Patient Gender: F                 HR:           53 bpm. Exam Location:  Inpatient Procedure: 2D Echo, Cardiac Doppler, Color Doppler and Intracardiac            Opacification Agent Indications:    Stroke  History:        Patient has no prior history of Echocardiogram examinations.                 TIA; Risk Factors:Hypertension, Dyslipidemia and Diabetes.   Sonographer:    Sheralyn Boatman RDCS Referring Phys: [id number] Gordy Councilman  Sonographer Comments: Patient is obese and Technically difficult study due to poor echo windows. Image acquisition challenging due to patient body habitus. IMPRESSIONS  1. Left ventricular ejection fraction, by estimation, is 60 to 65%. The left ventricle has normal function. The left ventricle has no regional wall motion abnormalities. Left ventricular diastolic parameters are consistent with Grade I diastolic dysfunction (impaired relaxation).  2. Right ventricular systolic function is normal. The right ventricular size is normal.  3. The mitral valve is grossly normal. Trivial mitral valve regurgitation.  4. The aortic valve is tricuspid. Aortic valve regurgitation is trivial. Aortic valve sclerosis/calcification is present, without any evidence of aortic stenosis.  5. The inferior vena cava is normal in size with greater than 50% respiratory variability, suggesting right atrial pressure of 3 mmHg. Comparison(s): No prior Echocardiogram. FINDINGS  Left Ventricle: Left ventricular ejection fraction, by estimation, is 60 to 65%. The left ventricle has normal function. The left ventricle has no regional wall motion abnormalities. Definity contrast agent was given IV to delineate the left ventricular  endocardial borders. The left ventricular internal cavity size was normal in size. There is no left ventricular hypertrophy. Left ventricular diastolic parameters are consistent with Grade I diastolic dysfunction (impaired relaxation). Right Ventricle: The right ventricular size is normal. Right vetricular wall thickness was not well visualized. Right ventricular systolic function is normal. Left Atrium: Left atrial size was normal in size. Right Atrium: Right atrial size was normal in size. Pericardium: There is no evidence of pericardial effusion. Mitral Valve: The mitral valve is grossly normal. Trivial mitral valve regurgitation. Tricuspid  Valve: The tricuspid valve is normal in structure. Tricuspid valve regurgitation is trivial. Aortic Valve: The aortic valve is tricuspid. Aortic valve regurgitation is trivial. Aortic valve sclerosis/calcification is present, without any evidence of aortic stenosis. Pulmonic Valve: The pulmonic valve was grossly normal. Pulmonic valve regurgitation is not visualized. Aorta: The aortic root is normal in size and structure. Venous: The inferior vena cava is normal in size with greater than 50% respiratory variability, suggesting right atrial pressure of 3 mmHg.  IAS/Shunts: The atrial septum is grossly normal.  LEFT VENTRICLE PLAX 2D LVIDd:         4.70 cm      Diastology LVIDs:         3.20 cm      LV e' medial:    8.16 cm/s LV PW:         1.30 cm      LV E/e' medial:  10.3 LV IVS:        1.20 cm      LV e' lateral:   6.64 cm/s LVOT diam:     2.00 cm      LV E/e' lateral: 12.7 LV SV:         95 LV SV Index:   50 LVOT Area:     3.14 cm  LV Volumes (MOD) LV vol d, MOD A2C: 108.4 ml LV vol d, MOD A4C: 91.7 ml LV vol s, MOD A2C: 40.7 ml LV vol s, MOD A4C: 30.9 ml LV SV MOD A2C:     67.8 ml LV SV MOD A4C:     91.7 ml LV SV MOD BP:      64.2 ml RIGHT VENTRICLE             IVC RV S prime:     14.70 cm/s  IVC diam: 1.60 cm TAPSE (M-mode): 2.5 cm LEFT ATRIUM             Index        RIGHT ATRIUM           Index LA diam:        3.00 cm 1.59 cm/m   RA Area:     16.30 cm LA Vol (A2C):   36.4 ml 19.24 ml/m  RA Volume:   45.20 ml  23.89 ml/m LA Vol (A4C):   17.9 ml 9.46 ml/m LA Biplane Vol: 25.2 ml 13.32 ml/m  AORTIC VALVE LVOT Vmax:   129.00 cm/s LVOT Vmean:  81.700 cm/s LVOT VTI:    0.301 m  AORTA Ao Root diam: 3.40 cm Ao Asc diam:  3.10 cm MITRAL VALVE MV Area (PHT): 3.36 cm     SHUNTS MV Decel Time: 226 msec     Systemic VTI:  0.30 m MV E velocity: 84.15 cm/s   Systemic Diam: 2.00 cm MV A velocity: 102.35 cm/s MV E/A ratio:  0.82 Laurance Flatten MD Electronically signed by Laurance Flatten MD Signature Date/Time:  02/20/2023/12:14:40 PM    Final    MR BRAIN WO CONTRAST  Result Date: 02/20/2023 CLINICAL DATA:  77 year old female code stroke presentation yesterday. Vertigo. EXAM: MRI HEAD WITHOUT CONTRAST TECHNIQUE: Multiplanar, multiecho pulse sequences of the brain and surrounding structures were obtained without intravenous contrast. COMPARISON:  CT head, CTA head and neck yesterday. FINDINGS: Brain: No restricted diffusion to suggest acute infarction. No midline shift, mass effect, evidence of mass lesion, ventriculomegaly, extra-axial collection or acute intracranial hemorrhage. Cervicomedullary junction and pituitary are within normal limits. Wallace Cullens and white matter signal is within normal limits for age throughout the brain. Minimal nonspecific white matter T2/FLAIR signal changes. No convincing cortical encephalomalacia or chronic cerebral blood products. Cerebral volume is within normal limits for age. Vascular: Major intracranial vascular flow voids are preserved. Skull and upper cervical spine: Negative. Visualized bone marrow signal is within normal limits. Sinuses/Orbits: Rightward gaze, but otherwise normal orbits soft tissues. Paranasal sinuses are stable and well aerated. Other: Grossly normal visible internal auditory structures aside from trace mastoid fluid  which appears inconsequential. Normal stylomastoid foramina. Negative visible scalp and face. IMPRESSION: No acute intracranial abnormality. Normal for age noncontrast MRI appearance of the brain. Electronically Signed   By: Odessa Fleming M.D.   On: 02/20/2023 07:39   CT ANGIO HEAD NECK W WO CM (CODE STROKE)  Result Date: 02/19/2023 CLINICAL DATA:  Vertigo, nausea EXAM: CT ANGIOGRAPHY HEAD AND NECK WITH AND WITHOUT CONTRAST TECHNIQUE: Multidetector CT imaging of the head and neck was performed using the standard protocol during bolus administration of intravenous contrast. Multiplanar CT image reconstructions and MIPs were obtained to evaluate the vascular  anatomy. Carotid stenosis measurements (when applicable) are obtained utilizing NASCET criteria, using the distal internal carotid diameter as the denominator. RADIATION DOSE REDUCTION: This exam was performed according to the departmental dose-optimization program which includes automated exposure control, adjustment of the mA and/or kV according to patient size and/or use of iterative reconstruction technique. CONTRAST:  75mL OMNIPAQUE IOHEXOL 350 MG/ML SOLN COMPARISON:  No prior CTA available, correlation is made with 02/19/2023 CT head FINDINGS: CT HEAD FINDINGS For noncontrast findings, please see same day CT head. CTA NECK FINDINGS Aortic arch: Standard branching. Imaged portion shows no evidence of aneurysm or dissection. No significant stenosis of the major arch vessel origins. Right carotid system: No evidence of dissection, occlusion, or hemodynamically significant stenosis (greater than 50%). Left carotid system: No evidence of dissection, occlusion, or hemodynamically significant stenosis (greater than 50%). Vertebral arteries: No evidence of dissection, occlusion, or hemodynamically significant stenosis (greater than 50%). Skeleton: No acute osseous abnormality. Degenerative changes in the cervical spine. Other neck: Multiple hypoenhancing nodules in the thyroid, which measure up to 11 mm, for which no follow-up is currently indicated. (Reference: J Am Coll Radiol. 2015 Feb;12(2): 143-50) Upper chest: No focal pulmonary opacity or pleural effusion. Review of the MIP images confirms the above findings CTA HEAD FINDINGS Anterior circulation: Both internal carotid arteries are patent to the termini, without significant stenosis. Patent left A1. Aplastic right A1. Normal anterior communicating artery. Anterior cerebral arteries are patent to their distal aspects without significant stenosis. No M1 stenosis or occlusion. MCA branches perfused to their distal aspects without significant stenosis. Posterior  circulation: Vertebral arteries patent to the vertebrobasilar junction without significant stenosis. Posterior inferior cerebellar arteries patent proximally. Basilar patent to its distal aspect without significant stenosis. Superior cerebellar arteries patent proximally, duplicated on the left. Patent P1 segments. PCAs perfused to their distal aspects without significant stenosis. The bilateral posterior communicating arteries are not visualized. Venous sinuses: Patent. Anatomic variants: Aplastic right A1.  Duplicated left SCA. Review of the MIP images confirms the above findings IMPRESSION: 1. No intracranial large vessel occlusion or significant stenosis. 2. No hemodynamically significant stenosis in the neck. Electronically Signed   By: Wiliam Ke M.D.   On: 02/19/2023 21:10   CT HEAD CODE STROKE WO CONTRAST  Result Date: 02/19/2023 CLINICAL DATA:  Code stroke.  Vertigo, nausea EXAM: CT HEAD WITHOUT CONTRAST TECHNIQUE: Contiguous axial images were obtained from the base of the skull through the vertex without intravenous contrast. RADIATION DOSE REDUCTION: This exam was performed according to the departmental dose-optimization program which includes automated exposure control, adjustment of the mA and/or kV according to patient size and/or use of iterative reconstruction technique. COMPARISON:  None Available. FINDINGS: Brain: No evidence of acute infarction, hemorrhage, mass, mass effect, or midline shift. No hydrocephalus or extra-axial collection. Vascular: No hyperdense vessel. Skull: Negative for fracture or focal lesion. Sinuses/Orbits: No acute finding. Other: The mastoid  air cells are well aerated. ASPECTS [facility] Stroke Program Early CT Score) - Ganglionic level infarction (caudate, lentiform nuclei, internal capsule, insula, M1-M3 cortex): 7 - Supraganglionic infarction (M4-M6 cortex): 3 Total score (0-10 with 10 being normal): 10 IMPRESSION: No evidence of acute intracranial process. ASPECTS  is 10. Code stroke imaging results were communicated on 02/19/2023 at 7:50 pm to provider [facility] via telephone, who verbally acknowledged these results. Electronically Signed   By: Wiliam Ke M.D.   On: 02/19/2023 19:52    Microbiology: Results for orders placed or performed in visit on 01/08/22  Urine Culture     Status: None   Collection Time: 01/08/22  1:27 PM   Specimen: Urine   UR  Result Value Ref Range Status   Urine Culture, Routine Final report  Final   Organism ID, Bacteria Comment  Final    Comment: Mixed urogenital flora 10,000-25,000 colony forming units per mL   Microscopic Examination     Status: Abnormal   Collection Time: 01/08/22  1:27 PM   Urine  Result Value Ref Range Status   WBC, UA 0-5 0 - 5 /hpf Final   RBC, Urine 0-2 0 - 2 /hpf Final   Epithelial Cells (non renal) 0-10 0 - 10 /hpf Final   Renal Epithel, UA None seen None seen /hpf Final   Bacteria, UA Few (A) None seen/Few Final    Labs: CBC: Recent Labs  Lab 02/19/23 1950 02/20/23 0424 02/20/23 1157  WBC 11.7* 7.8  --   NEUTROABS 9.1* 5.0  --   HGB 12.6 11.4* 12.0  HCT 38.4 35.7* 37.0  MCV 85.1 87.1  --   PLT 258 223  --    Basic Metabolic Panel: Recent Labs  Lab 02/19/23 1950 02/20/23 0424  NA 140 141  K 3.9 4.1  CL 104 107  CO2 24 25  GLUCOSE 111* 93  BUN 24* 20  CREATININE 1.05* 1.12*  CALCIUM 9.6 9.1  MG  --  1.7   Liver Function Tests: Recent Labs  Lab 02/19/23 1950 02/20/23 0424  AST 16 15  ALT 16 16  ALKPHOS 96 82  BILITOT 0.5 0.6  PROT 6.7 5.3*  ALBUMIN 4.2 2.9*   CBG: Recent Labs  Lab 02/19/23 1901  GLUCAP 113*    Discharge time spent: less than 30 minutes.  Signed: Clydie Braun, MD [facility] 02/20/2023

## 2023-02-20 NOTE — Progress Notes (Signed)
Discharge teaching completed. Patient denies questions or complaints. Patient is discharging home with her son.

## 2023-02-20 NOTE — Progress Notes (Signed)
Carryover admission to the Day Admitter; accepted by Dr.  Joneen Roach as transfer from  [facility]  to a  med-tele bed at  [facility]  for  further TIA/stroke work-up. Please see Dr.  Lajoyce Lauber progress note for additional details.   Briefly, this is a 77 year old female who is being admitted as transfer from [facility] for further TIA/stroke workup after presenting to [facility] complaining of acute onset of vertigo starting at 1500 on 02/19/2023.  Noted to have vertical nystagmus on physical exam at [facility].  EDP at [facility] discussed case with on-call neurology, Dr. Iver Nestle, who conveyed that presentation was outside of window for consideration for tPA, per recommended [facility] admit to [facility] for further TIA/stroke workup.  The patient has existing orders that include echocardiogram, MRI brain, PT/OT/ST consults.  I have placed some additional preliminary admit orders via the adult multi-morbid admission order set. I have also placed orders to be of the focused TIA/stroke order set including serial neurochecks, lipid panel, n.p.o. pending RN swallow screen.  Also ordered morning labs in the form of CMP, CBC, serum magnesium level.  It appears that the patient had a hemoglobin A1c checked within the last 3 months, so I did not repeat this lab at the present time.    Newton Pigg, DO Hospitalist

## 2023-02-20 NOTE — Progress Notes (Signed)
Patient to MRI.

## 2023-02-20 NOTE — Progress Notes (Signed)
  Echocardiogram 2D Echocardiogram has been performed.  Janalyn Harder 02/20/2023, 10:55 AM

## 2023-02-20 NOTE — Progress Notes (Signed)
Patient back from MRI.

## 2023-02-20 NOTE — Progress Notes (Signed)
OT Cancellation Note  Patient Details Name: Sarah Benton MRN: [id number] DOB: [birth year]   Cancelled Treatment:    Reason Eval/Treat Not Completed: Other (comment): OT Evaluation attempted at 1012 and 1126 AM. Each time pt was with another discipline. Will continue efforts as time allows.   Theodoro Clock 02/20/2023, 12:05 PM

## 2023-02-20 NOTE — Evaluation (Signed)
Physical Therapy Evaluation Patient Details Name: Sarah Benton MRN: [id number] DOB: August 23, 1946 Today's Date: 02/20/2023  History of Present Illness  77 year old female who admitted as transfer from [facility] for further TIA/stroke workup after presenting to [facility] complaining of acute onset of vertigo starting at 1500 on 02/19/2023.  Documented to have vertical nystagmus on physical exam at [facility].  Pt outside of window for consideration for tPA.  All testing negative. PMH: HTN, HLD, DM type 2, GERD, morbid obesity, anemia, and h/o vertigo.  Clinical Impression  Pt admitted with above diagnosis. Pt was able to ambulate in hallway without device with min guard assist without LOB and scored 20/24 on DGI suggesting low risk of falls. Pt utilizes compensation strategies for safety with mobility. 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  Pt aware to f/u with Outpt PT.  Pt declines RW as she states she feels that she can manage at home without one and pt does have good safety awareness. Pt does state that she had been taking Meclizine as she had a bout of vertigo 5/18 and she ran out.  PT did explain to pt that exercises would be more beneficial than the meds as the meds suppress the system and exercises will make the vestibular system better. Also took orthostatic VS and placed in flowsheet and in comments below. Will continue to work with pt acutely.  Pt currently with functional limitations due to the deficits listed below (see PT Problem List). Pt will benefit from acute skilled PT to increase their independence and safety with mobility to allow discharge.          Recommendations for follow up therapy are one component of a multi-disciplinary discharge planning process, led by the attending physician.  Recommendations may be updated based on patient status, additional functional criteria and insurance authorization.  Follow Up  Recommendations       Assistance Recommended at Discharge PRN  Patient can return home with the following  Assistance with cooking/housework    Equipment Recommendations None recommended by PT  Recommendations for Other Services       Functional Status Assessment Patient has had a recent decline in their functional status and demonstrates the ability to make significant improvements in function in a reasonable and predictable amount of time.     Precautions / Restrictions Precautions Precautions: Fall Restrictions Weight Bearing Restrictions: No      Mobility  Bed Mobility Overal bed mobility: Independent             General bed mobility comments: Tested pt for BPPV however could not elicit nystagmus with testing. Pt did have overshooting to right with saccades and positive head thrust test to right suggesting pt with right hypofunction.    Transfers Overall transfer level: Independent                      Ambulation/Gait Ambulation/Gait assistance: Min guard Gait Distance (Feet): 200 Feet Assistive device: None Gait Pattern/deviations: Step-through pattern, Decreased stride length   Gait velocity interpretation: <1.31 ft/sec, indicative of household ambulator   General Gait Details: Pt able to ambulate to desk with overall good balance.  Pt did report a little dizziness with turning which PT suspects is from hypofunction. Gave pt strategies to use to decr dizziness when up such as moving slowly and focusing on objects.  Stairs            Wheelchair  Mobility    Modified Rankin (Stroke Patients Only) Modified Rankin (Stroke Patients Only) Pre-Morbid Rankin Score: No symptoms Modified Rankin: Moderate disability     Balance                                 Standardized Balance Assessment Standardized Balance Assessment : Dynamic Gait Index   Dynamic Gait Index Level Surface: Normal Change in Gait Speed: Normal Gait with  Horizontal Head Turns: Mild Impairment Gait with Vertical Head Turns: Normal Gait and Pivot Turn: Mild Impairment Step Over Obstacle: Mild Impairment Step Around Obstacles: Normal Steps: Mild Impairment Total Score: 20       Pertinent Vitals/Pain Pain Assessment Pain Assessment: No/denies pain    Home Living Family/patient expects to be discharged to:: Private residence Living Arrangements: Alone Available Help at Discharge: Family;Available PRN/intermittently (son works from home next door, sister can assist) Type of Home: House Home Access: Stairs to enter Entrance Stairs-Rails: Right Entrance Stairs-Number of Steps: 3   Home Layout: One level Home Equipment: None      Prior Function Prior Level of Function : Needs assist;Driving             Mobility Comments: No assist       Hand Dominance   Dominant Hand: Right    Extremity/Trunk Assessment   Upper Extremity Assessment Upper Extremity Assessment: Defer to OT evaluation    Lower Extremity Assessment Lower Extremity Assessment: Overall WFL for tasks assessed    Cervical / Trunk Assessment Cervical / Trunk Assessment: Normal  Communication   Communication: No difficulties  Cognition Arousal/Alertness: Awake/alert Behavior During Therapy: WFL for tasks assessed/performed Overall Cognitive Status: Within Functional Limits for tasks assessed                                          General Comments General comments (skin integrity, edema, etc.): Supine 110/48, 57 bpm; 110/56, 65 bpm sitting; standing 77 bpm, 113/95; standing 3 min 79 bpm, 114/78    Exercises Other Exercises Other Exercises: Gave [facility] handout with exercise program for gaze stability and educated pt.   Assessment/Plan    PT Assessment Patient needs continued PT services  PT Problem List Decreased activity tolerance;Decreased balance;Decreased mobility;Decreased knowledge of use of DME;Decreased safety  awareness;Decreased knowledge of precautions       PT Treatment Interventions DME instruction;Gait training;Functional mobility training;Therapeutic activities;Therapeutic exercise;Balance training;Wheelchair mobility training;Patient/family education;Stair training    PT Goals (Current goals can be found in the Care Plan section)  Acute Rehab PT Goals Patient Stated Goal: to go home PT Goal Formulation: With patient Time For Goal Achievement: 03/06/23 Potential to Achieve Goals: Good    Frequency Min 4X/week     Co-evaluation               AM-PAC PT "6 Clicks" Mobility  Outcome Measure Help needed turning from your back to your side while in a flat bed without using bedrails?: None Help needed moving from lying on your back to sitting on the side of a flat bed without using bedrails?: None Help needed moving to and from a bed to a chair (including a wheelchair)?: A Little Help needed standing up from a chair using your arms (e.g., wheelchair or bedside chair)?: A Little Help needed to walk in hospital room?: A Little Help needed  climbing 3-5 steps with a railing? : A Little 6 Click Score: 20    End of Session Equipment Utilized During Treatment: Gait belt Activity Tolerance: Patient tolerated treatment well Patient left: in bed;with call bell/phone within reach;with bed alarm set;with family/visitor present Nurse Communication: Mobility status PT Visit Diagnosis: Muscle weakness (generalized) (M62.81)    Time: 1610-9604 PT Time Calculation (min) (ACUTE ONLY): 55 min   Charges:   PT Evaluation $PT Eval Moderate Complexity: 1 Mod PT Treatments $Gait Training: 8-22 mins $Therapeutic Exercise: 8-22 mins $Therapeutic Activity: 8-22 mins        Sherlock Nancarrow M,PT Acute Rehab Services [phone]   Bevelyn Buckles 02/20/2023, 2:06 PM

## 2023-02-20 NOTE — Plan of Care (Signed)
Patient is discharging home today.

## 2023-02-20 NOTE — Progress Notes (Signed)
CSW received call about patient needing outpatient vestibular PT. RNCM made referral to [facility]. Info on AVS. No other needs identified at this time.  Joaquin Courts, MSW, [facility]

## 2023-02-20 NOTE — ED Notes (Signed)
[facility] at bedside for transport. 

## 2023-02-20 NOTE — Progress Notes (Signed)
SLP Cancellation Note  Patient Details Name: Sarah Benton MRN: [id number] DOB: December 06, 1945   Cancelled treatment:       Reason Eval/Treat Not Completed: SLP screened, no needs identified, will sign off Pt did not present with any acute speech, language, or cognitive-linguistic deficits on admission, MRI was unremarkable, and no overt communication or cognitive-linguistic deficits were noted by nursing. A formal evaluation does not appear to be clinically indicated at this time. SLP will sign off.  Sharry Beining I. Vear Clock, MS, CCC-SLP Neuro Diagnostic Specialist  Acute Rehabilitation Services Office number: [phone]  Scheryl Marten 02/20/2023, 10:36 AM

## 2023-02-20 NOTE — Progress Notes (Signed)
PT note Orthostatic BPs  Supine 110/48, 57 bpm  Sitting 110/56, 65 bpm  Standing 113/95, 77 bpm  Standing after 3 min 114/78, 79 bpm  Orthostatic BP taken during PT evaluation and as above. Full note of session to follow.  Dmetrius Ambs M,PT Acute Rehab Services [phone]

## 2023-02-20 NOTE — H&P (Addendum)
History and Physical    Patient: Sarah Benton:[id number] DOB: Feb 08, 1946 DOA: 02/19/2023 DOS: the patient was seen and examined on 02/20/2023 PCP: Bennie Pierini, FNP  Patient coming from: Transfer from [facility]  Chief Complaint:  Chief Complaint  Patient presents with   Dizziness   HPI: Sarah Benton is a 77 y.o. female with medical history significant of hypertension, hyperlipidemia, iron deficiency anemia, arthritis, esophageal stricture, GERD, and obesity who presents with complaints of dizziness.  Symptoms started yesterday afternoon while the patient was trying to stand up.  She reported feeling lightheaded, but did not feel like the room was moving around her.  She did report getting nauseous and vomited.  She had tried taking a old prescription that she had from meclizine when this had happened previously in the past, but states that symptoms did not improve.  Normally patient is able to ambulate without need of assistance.  Patient denies having any recent fevers, abdominal pain, cough, shortness of breath.  On admission to the emergency department patient noted to be afebrile with pulse 40-65, and all other vital signs maintained.  CT scan of the head did not note any acute abnormality.  CTA of the head and neck did not note any large vessel occlusion.  ED provider at [facility] discussed case with on-call neurology, Dr. Iver Nestle, who conveyed that presentation was outside of window for consideration for tPA.  Patient had been given meclizine, Plavix, and aspirin TRH placed orders to admit to a telemetry bed at [facility] for further TIA/stroke workup.  MRI of brain did not note any acute.  Review of Systems: As mentioned in the history of present illness. All other systems reviewed and are negative. Past Medical History:  Diagnosis Date   Anemia    Iron def   Arthritis    Cataract    Edema    Esophageal stricture    GERD (gastroesophageal reflux disease)     Hyperlipidemia    Hypertension    Obesity    Ruptured lumbar disc    Past Surgical History:  Procedure Laterality Date   ABDOMINAL HYSTERECTOMY Bilateral 2001   complete   BREAST BIOPSY  1993   Left/negative   BREAST CYST EXCISION Left    LUMBAR LAMINECTOMY  1998   ruptured disc   TONSILLECTOMY     TUBAL LIGATION  1979   Social History:  reports that she has never smoked. She has never used smokeless tobacco. She reports that she does not drink alcohol and does not use drugs.  Allergies  Allergen Reactions   Amoxicillin Diarrhea   Crestor [Rosuvastatin Calcium]     Leg cramps    Septra [Sulfamethoxazole-Trimethoprim]     Nausea    Sulfamethoxazole-Trimethoprim Other (See Comments)   Atorvastatin Other (See Comments)    Myalgias / muscle aches     Family History  Problem Relation Age of Onset   Parkinsonism Mother 66   Ovarian cancer Mother    Hypertension Father    Prostate cancer Father 36       met to bone / hip   Arthritis Sister        knee   Diabetes Daughter    Diabetes Son        type 2   Diabetes Grandchild 17       type 1   Colon cancer Neg Hx    Breast cancer Neg Hx     Prior to Admission medications   Medication Sig Start  Date End Date Taking? Authorizing Provider  Blood Glucose Monitoring Suppl (ACCU-CHEK GUIDE) w/Device KIT USE UP TO FOUR TIMES DAILY AS DIRECTED Dx E11.9 08/23/22   Bennie Pierini, FNP  Cholecalciferol (VITAMIN D3) 2000 UNITS capsule Take 2,000 Units by mouth daily.    [provider]  Coenzyme Q10 (CO Q-10 PO) Take by mouth.    [provider]  HYDROcodone-acetaminophen (NORCO) 10-325 MG tablet Take 1 tablet by mouth every 6 (six) hours as needed.    [provider]  ketoconazole 2%-triamcinolone 0.1% 1:2 cream mixture Apply topically 2 (two) times daily as needed. 11/11/22   Daphine Deutscher, Mary-Margaret, FNP  lisinopril-hydrochlorothiazide (ZESTORETIC) 20-12.5 MG tablet Take 1 tablet by mouth daily.  12/10/22   Bennie Pierini, FNP  meloxicam (MOBIC) 15 MG tablet TAKE ONE TABLET DAILY 01/27/23   Daphine Deutscher, Mary-Margaret, FNP  omeprazole (PRILOSEC) 20 MG capsule Take 1 capsule (20 mg total) by mouth daily. 12/10/22   Daphine Deutscher, Mary-Margaret, FNP  rosuvastatin (CRESTOR) 20 MG tablet Take 1 tablet (20 mg total) by mouth daily. 12/10/22   Bennie Pierini, FNP  Semaglutide,0.25 or 0.5MG /DOS, (OZEMPIC, 0.25 OR 0.5 MG/DOSE,) 2 MG/3ML SOPN Start with 0.25mg  for 2 weeks then 0.5mg  weekly 12/10/22   Bennie Pierini, FNP    Physical Exam: Vitals:   02/20/23 0200 02/20/23 0230 02/20/23 0230 02/20/23 0309  BP: (!) 115/54 119/73  (!) 144/53  Pulse: (!) 44 (!) 47  (!) 46  Resp: 15 16  17   Temp:   97.7 F (36.5 C) 98 F (36.7 C)  TempSrc:   Oral Oral  SpO2: 96% 99%  95%   Constitutional: Elderly female who appears to be in no acute distress at this time Eyes: PERRL, mild horizontal nystagmus noted when testing visual fields to the right ENMT: Mucous membranes are moist. Posterior pharynx clear of any exudate or lesions. Neck: normal, supple,  Respiratory: clear to auscultation bilaterally, no wheezing, no crackles. Normal respiratory effort. No accessory muscle use.  Cardiovascular: Regular rate and rhythm, no murmurs / rubs / gallops. No extremity edema. 2+ pedal pulses. No carotid bruits.  Abdomen: no tenderness, no masses palpated. No hepatosplenomegaly. Bowel sounds positive.  Musculoskeletal: no clubbing / cyanosis. No joint deformity upper and lower extremities. Good ROM, no contractures. Normal muscle tone.  Skin: no rashes, lesions, ulcers. No induration Neurologic: CN 2-12 grossly intact. Sensation intact, DTR normal. Strength 5/5 in all 4.  Psychiatric: Normal judgment and insight. Alert and oriented x 3. Normal mood.   Data Reviewed:  EKG reveals sinus bradycardia 56 bpm with left axis deviation and incomplete right bundle branch block.  Assessment and  Plan:  Dizziness Patient presented with complaints of dizziness as though she could pass out with standing starting yesterday afternoon.  She tried taking old prescription of meclizine without improvement.  CT/CTA of the head and neck did not note any acute abnormality.  MRI of the brain was negative.   -Admit to a medical telemetry bed -Neurochecks -Check orthostatic vital signs -Hold lisinopril-hydrochlorothiazide -Check TSH -Check echocardiogram -PT/vestibular PT to evaluate and treat -Aspirin -Follow-up telemetry monitoring -Appreciate neurology consultative services, we will follow-up for any further recommendations  Bradycardia Acute.  Heart rates were noted 40-65.  She is not on any rate controlling medications.  Blood pressures otherwise maintained. -May warrant Holter monitor in the outpatient setting  Normocytic Anemia Hemoglobin 11.4 g/dL normally hemoglobin around 12.  Patient denied any reports of bleeding. -Recheck hemoglobin   Diabetes mellitus type 2, without long-term use  of insulin Hemoglobin A1c was noted to be 5.9.  Patient had been on Ozempic previously, but discontinued medicine due to it making her feel sick. -Carb modified diet  Chronic kidney disease stage IIIa Creatinine 1.12 which appears around patient's baseline creatinine. -Continue to monitor  Hyperlipidemia -Continue Crestor  Back pain Chronic.  Patient has a history of chronic lower back pain from ruptured disc. Home medication regimen includes Mobic.  GERD -Pharmacy substitution for omeprazole  DVT prophylaxis: Lovenox Advance Care Planning:   Code Status: Full Code  Consults: Neurology  Family Communication: Sister updated at bedside  Severity of Illness: The appropriate patient status for this patient is OBSERVATION. Observation status is judged to be reasonable and necessary in order to provide the required intensity of service to ensure the patient's safety. The patient's  presenting symptoms, physical exam findings, and initial radiographic and laboratory data in the context of their medical condition is felt to place them at decreased risk for further clinical deterioration. Furthermore, it is anticipated that the patient will be medically stable for discharge from the hospital within 2 midnights of admission.   Author: Clydie Braun, MD 02/20/2023 8:55 AM  For on call review [email or web address].

## 2023-02-20 NOTE — Progress Notes (Signed)
Code stroke activated at 1932, patient to CT. Dr. Nathaneil Canary connected at 1940, patient returned from CT at 61. MRS 0.   Duanne Moron, Telestroke RN

## 2023-02-21 LAB — LIPID PANEL
Cholesterol: 168 mg/dL (ref 0–200)
HDL: 53 mg/dL (ref >40)
LDL Cholesterol: 83 mg/dL (ref 0–99)
Total CHOL/HDL Ratio: 3.2 RATIO
Triglycerides: 160 mg/dL — ABNORMAL HIGH (ref <150)
VLDL: 32 mg/dL (ref 0–40)

## 2023-02-26 ENCOUNTER — Ambulatory Visit (INDEPENDENT_AMBULATORY_CARE_PROVIDER_SITE_OTHER): Payer: Medicare PPO

## 2023-02-26 VITALS — Ht 63.0 in | Wt 185.0 lb

## 2023-02-26 DIAGNOSIS — Z0000 Encounter for general adult medical examination without abnormal findings: Secondary | ICD-10-CM | POA: Diagnosis not present

## 2023-02-26 NOTE — Progress Notes (Signed)
Subjective:   Sarah Benton is a 77 y.o. female who presents for Medicare Annual (Subsequent) preventive examination.  Review of Systems    I connected with  Sarah Benton on 02/26/23 by a audio enabled telemedicine application and verified that I am speaking with the correct person using two identifiers.  Patient Location: Home  Provider Location: Home Office  I discussed the limitations of evaluation and management by telemedicine. The patient expressed understanding and agreed to proceed.  Cardiac Risk Factors include: advanced age (>58men, >27 women);diabetes mellitus;dyslipidemia;hypertension     Objective:    Today's Vitals   02/26/23 0846  Weight: 185 lb (83.9 kg)  Height: 5\' 3"  (1.6 m)   Body mass index is 32.77 kg/m.     02/26/2023    8:49 AM 02/20/2023    5:37 PM 02/19/2023    6:56 PM 08/07/2021   12:30 PM 07/16/2021   11:19 AM 06/02/2018    8:59 AM 05/21/2016   11:31 AM  Advanced Directives  Does Patient Have a Medical Advance Directive? Yes No No Yes Yes Yes Yes  Type of Estate agent of [place];Living will    Healthcare Power of [place];Living will Healthcare Power of [place];Living will Healthcare Power of [place];Living will  Does patient want to make changes to medical advance directive?      No - Patient declined   Copy of Healthcare Power of Attorney in Chart? No - copy requested    No - copy requested No - copy requested No - copy requested  Would patient like information on creating a medical advance directive?  No - Patient declined No - Patient declined        Current Medications (verified) Outpatient Encounter Medications as of 02/26/2023  Medication Sig   aspirin EC 81 MG tablet Take 1 tablet (81 mg total) by mouth daily. Swallow whole.   Blood Glucose Monitoring Suppl (ACCU-CHEK GUIDE) w/Device KIT USE UP TO FOUR TIMES DAILY AS DIRECTED Dx E11.9   Cholecalciferol (VITAMIN D3) 2000 UNITS capsule Take 2,000 Units by  mouth daily.   Coenzyme Q10 (CO Q-10) 200 MG CAPS Take 200 mg by mouth daily.   HYDROcodone-acetaminophen (NORCO) 10-325 MG tablet Take 1 tablet by mouth every 6 (six) hours as needed.   ketoconazole 2%-triamcinolone 0.1% 1:2 cream mixture Apply topically 2 (two) times daily as needed.   lisinopril-hydrochlorothiazide (ZESTORETIC) 20-12.5 MG tablet Take 1 tablet by mouth daily.   meclizine (ANTIVERT) 25 MG tablet Take 1 tablet (25 mg total) by mouth 3 (three) times daily as needed for dizziness.   meloxicam (MOBIC) 15 MG tablet TAKE ONE TABLET DAILY   omeprazole (PRILOSEC) 20 MG capsule Take 1 capsule (20 mg total) by mouth daily.   rosuvastatin (CRESTOR) 20 MG tablet Take 1 tablet (20 mg total) by mouth daily.   No facility-administered encounter medications on file as of 02/26/2023.    Allergies (verified) Amoxicillin, Crestor [rosuvastatin calcium], Septra [sulfamethoxazole-trimethoprim], Sulfamethoxazole-trimethoprim, and Atorvastatin   History: Past Medical History:  Diagnosis Date   Anemia    Iron def   Arthritis    Cataract    Edema    Esophageal stricture    GERD (gastroesophageal reflux disease)    Hyperlipidemia    Hypertension    Obesity    Ruptured lumbar disc    Past Surgical History:  Procedure Laterality Date   ABDOMINAL HYSTERECTOMY Bilateral 2001   complete   BREAST BIOPSY  1993   Left/negative   BREAST  CYST EXCISION Left    LUMBAR LAMINECTOMY  1998   ruptured disc   TONSILLECTOMY     TUBAL LIGATION  1979   Family History  Problem Relation Age of Onset   Parkinsonism Mother 57   Ovarian cancer Mother    Hypertension Father    Prostate cancer Father 55       met to bone / hip   Arthritis Sister        knee   Diabetes Daughter    Diabetes Son        type 2   Diabetes Grandchild 17       type 1   Colon cancer Neg Hx    Breast cancer Neg Hx    Social History   Socioeconomic History   Marital status: Widowed    Spouse name: Not on file    Number of children: 2   Years of education: 94   Highest education level: 12th grade  Occupational History   Occupation: Retired    Comment: School system  Tobacco Use   Smoking status: Never   Smokeless tobacco: Never  Vaping Use   Vaping Use: Never used  Substance and Sexual Activity   Alcohol use: No    Alcohol/week: 0.0 standard drinks of alcohol   Drug use: No   Sexual activity: Not Currently  Other Topics Concern   Not on file  Social History Narrative   Not on file   Social Determinants of Health   Financial Resource Strain: Low Risk  (02/26/2023)   Overall Financial Resource Strain (CARDIA)    Difficulty of Paying Living Expenses: Not hard at all  Food Insecurity: No Food Insecurity (02/26/2023)   Hunger Vital Sign    Worried About Running Out of Food in the Last Year: Never true    Ran Out of Food in the Last Year: Never true  Transportation Needs: No Transportation Needs (02/26/2023)   PRAPARE - Administrator, Civil Service (Medical): No    Lack of Transportation (Non-Medical): No  Physical Activity: Inactive (02/26/2023)   Exercise Vital Sign    Days of Exercise per Week: 0 days    Minutes of Exercise per Session: 0 min  Stress: No Stress Concern Present (02/26/2023)   [employer] of Occupational Health - Occupational Stress Questionnaire    Feeling of Stress : Not at all  Social Connections: Socially Isolated (02/26/2023)   Social Connection and Isolation Panel [NHANES]    Frequency of Communication with Friends and Family: More than three times a week    Frequency of Social Gatherings with Friends and Family: More than three times a week    Attends Religious Services: Never    Database administrator or Organizations: No    Attends Banker Meetings: Never    Marital Status: Widowed    Tobacco Counseling Counseling given: Not Answered   Clinical Intake:  Pre-visit preparation completed: Yes  Pain : No/denies pain      Nutritional Risks: None Diabetes: Yes CBG done?: No Did pt. bring in CBG monitor from home?: No  How often do you need to have someone help you when you read instructions, pamphlets, or other written materials from your doctor or pharmacy?: 1 - Never  Diabetic?yes  Nutrition Risk Assessment:  Has the patient had any N/V/D within the last 2 months?  No  Does the patient have any non-healing wounds?  No  Has the patient had any unintentional weight loss  or weight gain?  No   Diabetes:  Is the patient diabetic?  Yes  If diabetic, was a CBG obtained today?  No  Did the patient bring in their glucometer from home?  No  How often do you monitor your CBG's? Never .   Financial Strains and Diabetes Management:  Are you having any financial strains with the device, your supplies or your medication? No .  Does the patient want to be seen by Chronic Care Management for management of their diabetes?  No  Would the patient like to be referred to a Nutritionist or for Diabetic Management?  No   Diabetic Exams:  Diabetic Eye Exam: Completed 09/2022 Diabetic Foot Exam: Overdue, Pt has been advised about the importance in completing this exam. Pt is scheduled for diabetic foot exam on next office visit .   Interpreter Needed?: No  Information entered by :: Renie Ora, LPN   Activities of Daily Living    02/26/2023    8:50 AM 02/20/2023    5:36 PM  In your present state of health, do you have any difficulty performing the following activities:  Hearing? 0   Vision? 0   Difficulty concentrating or making decisions? 0   Walking or climbing stairs? 0   Dressing or bathing? 0   Doing errands, shopping? 0 0  Preparing Food and eating ? N   Using the Toilet? N   In the past six months, have you accidently leaked urine? N   Do you have problems with loss of bowel control? N   Managing your Medications? N   Managing your Finances? N   Housekeeping or managing your Housekeeping? N      Patient Care Team: Bennie Pierini, FNP as PCP - General (Nurse Practitioner) Tracey Harries, MD as Consulting Physician (Obstetrics and Gynecology) Meryl Dare, MD as Consulting Physician (Gastroenterology) Adam Phenix, DPM as Consulting Physician (Podiatry)  Indicate any recent Medical Services you may have received from other than [facility] providers in the past year (date may be approximate).     Assessment:   This is a routine wellness examination for Leonard.  Hearing/Vision screen Vision Screening - Comments:: Wears rx glasses - up to date with routine eye exams with  Dr.Free man   Dietary issues and exercise activities discussed: Current Exercise Habits: The patient does not participate in regular exercise at present, Exercise limited by: None identified   Goals Addressed             This Visit's Progress    DIET - INCREASE WATER INTAKE         Depression Screen    02/26/2023    8:49 AM 12/10/2022    8:41 AM 08/23/2022   10:23 AM 06/21/2022    8:36 AM 12/24/2021   12:35 PM 12/18/2021    9:58 AM 07/16/2021   11:14 AM  PHQ 2/9 Scores  PHQ - 2 Score 0 0 0 0 0 0 0  PHQ- 9 Score  3 0 0  0 1    Fall Risk    02/26/2023    8:48 AM 12/10/2022    8:41 AM 08/23/2022   10:23 AM 06/21/2022    8:36 AM 12/24/2021   12:34 PM  Fall Risk   Falls in the past year? 0 0 0 0 0  Number falls in past yr: 0 0     Injury with Fall? 0 0     Risk for fall due to : No Fall  Risks      Follow up Falls prevention discussed        FALL RISK PREVENTION PERTAINING TO THE HOME:  Any stairs in or around the home? Yes  If so, are there any without handrails? No  Home free of loose throw rugs in walkways, pet beds, electrical cords, etc? Yes  Adequate lighting in your home to reduce risk of falls? Yes   ASSISTIVE DEVICES UTILIZED TO PREVENT FALLS:  Life alert? No  Use of a cane, walker or w/c? No  Grab bars in the bathroom? No  Shower chair or bench in shower? No  Elevated  toilet seat or a handicapped toilet? No       06/02/2018    9:09 AM 05/21/2016   11:35 AM 01/10/2015   11:56 AM  MMSE - Mini Mental State Exam  Orientation to time 5 5 5   Orientation to Place 5 5 5   Registration 3 3 3   Attention/ Calculation 5 5 5   Recall 3 3 3   Language- name 2 objects 2 2 2   Language- repeat 1  1  Language- follow 3 step command 3  3  Language- read & follow direction 1  1  Write a sentence 1  1  Copy design 1  1  Total score 30  30        02/26/2023    8:50 AM  6CIT Screen  What Year? 0 points  What month? 0 points  What time? 0 points  Count back from 20 0 points  Months in reverse 0 points  Repeat phrase 0 points  Total Score 0 points    Immunizations Immunization History  Administered Date(s) Administered   Fluad Quad(high Dose 65+) 06/10/2019, 06/20/2020, 06/19/2021, 07/10/2022   Influenza Split 07/05/2020   Influenza, High Dose Seasonal PF 08/13/2016, 08/18/2017, 08/11/2018   Influenza,inj,Quad PF,6+ Mos 07/25/2014, 07/28/2015   Moderna Covid-19 Vaccine Bivalent Booster 34yrs & up 07/10/2021   Moderna SARS-COV2 Booster Vaccination 07/17/2020   Moderna Sars-Covid-2 Vaccination 10/13/2019, 11/10/2019, 01/30/2021, 07/10/2021, 03/12/2022   Pneumococcal Conjugate-13 07/25/2014   Pneumococcal Polysaccharide-23 06/07/2011   Tdap 06/07/2011   Zoster Recombinat (Shingrix) 12/18/2021, 06/21/2022   Zoster, Live 01/25/2015    TDAP status: Due, Education has been provided regarding the importance of this vaccine. Advised may receive this vaccine at local pharmacy or Health Dept. Aware to provide a copy of the vaccination record if obtained from local pharmacy or Health Dept. Verbalized acceptance and understanding.  Flu Vaccine status: Up to date  Pneumococcal vaccine status: Up to date  Covid-19 vaccine status: Completed vaccines  Qualifies for Shingles Vaccine? Yes   Zostavax completed Yes   Shingrix Completed?: Yes  Screening Tests Health  Maintenance  Topic Date Due   OPHTHALMOLOGY EXAM  Never done   Diabetic kidney evaluation - Urine ACR  Never done   DTaP/Tdap/Td (2 - Td or Tdap) 06/06/2021   COVID-19 Vaccine (8 - 2023-24 season) 05/24/2022   INFLUENZA VACCINE  04/24/2023   HEMOGLOBIN A1C  08/22/2023   MAMMOGRAM  11/09/2023   FOOT EXAM  12/10/2023   Diabetic kidney evaluation - eGFR measurement  02/20/2024   Medicare Annual Wellness (AWV)  02/26/2024   DEXA SCAN  06/20/2025   Pneumonia Vaccine 54+ Years old  Completed   Hepatitis C Screening  Completed   Zoster Vaccines- Shingrix  Completed   HPV VACCINES  Aged Out    Health Maintenance  Health Maintenance Due  Topic Date Due   OPHTHALMOLOGY EXAM  Never done   Diabetic kidney evaluation - Urine ACR  Never done   DTaP/Tdap/Td (2 - Td or Tdap) 06/06/2021   COVID-19 Vaccine (8 - 2023-24 season) 05/24/2022    Colorectal cancer screening: No longer required.   Mammogram status: No longer required due to age .  Bone Density status: Completed 06/20/2020. Results reflect: Bone density results: OSTEOPENIA. Repeat every 5 years.  Lung Cancer Screening: (Low Dose CT Chest recommended if Age 20-80 years, 30 pack-year currently smoking OR have quit w/in 15years.) does not qualify.   Lung Cancer Screening Referral: n/a  Additional Screening:  Hepatitis C Screening: does not qualify; Completed 07/28/2015  Vision Screening: Recommended annual ophthalmology exams for early detection of glaucoma and other disorders of the eye. Is the patient up to date with their annual eye exam?  Yes  Who is the provider or what is the name of the office in which the patient attends annual eye exams? Dr.Freeman  If pt is not established with a provider, would they like to be referred to a provider to establish care? No .   Dental Screening: Recommended annual dental exams for proper oral hygiene  Community Resource Referral / Chronic Care Management: CRR required this visit?  No    CCM required this visit?  No      Plan:     I have personally reviewed and noted the following in the patient's chart:   Medical and social history Use of alcohol, tobacco or illicit drugs  Current medications and supplements including opioid prescriptions. Patient is not currently taking opioid prescriptions. Functional ability and status Nutritional status Physical activity Advanced directives List of other physicians Hospitalizations, surgeries, and ER visits in previous 12 months Vitals Screenings to include cognitive, depression, and falls Referrals and appointments  In addition, I have reviewed and discussed with patient certain preventive protocols, quality metrics, and best practice recommendations. A written personalized care plan for preventive services as well as general preventive health recommendations were provided to patient.     Lorrene Reid, LPN   [id number]   Nurse Notes: Due TDAP Vaccine

## 2023-02-26 NOTE — Patient Instructions (Signed)
Sarah Benton , Thank you for taking time to come for your Medicare Wellness Visit. I appreciate your ongoing commitment to your health goals. Please review the following plan we discussed and let me know if I can assist you in the future.   These are the goals we discussed:  Goals      DIET - INCREASE WATER INTAKE     Exercise 150 min/wk Moderate Activity        This is a list of the screening recommended for you and due dates:  Health Maintenance  Topic Date Due   Eye exam for diabetics  Never done   Yearly kidney health urinalysis for diabetes  Never done   DTaP/Tdap/Td vaccine (2 - Td or Tdap) 06/06/2021   COVID-19 Vaccine (8 - 2023-24 season) 05/24/2022   Flu Shot  04/24/2023   Hemoglobin A1C  08/22/2023   Mammogram  11/09/2023   Complete foot exam   12/10/2023   Yearly kidney function blood test for diabetes  02/20/2024   Medicare Annual Wellness Visit  02/26/2024   DEXA scan (bone density measurement)  06/20/2025   Pneumonia Vaccine  Completed   Hepatitis C Screening  Completed   Zoster (Shingles) Vaccine  Completed   HPV Vaccine  Aged Out    Advanced directives: Please bring a copy of your health care power of attorney and living will to the office to be added to your chart at your convenience.   Conditions/risks identified: Aim for 30 minutes of exercise or brisk walking, 6-8 glasses of water, and 5 servings of fruits and vegetables each day.   Next appointment: Follow up in one year for your annual wellness visit    Preventive Care 65 Years and Older, Female Preventive care refers to lifestyle choices and visits with your health care provider that can promote health and wellness. What does preventive care include? A yearly physical exam. This is also called an annual well check. Dental exams once or twice a year. Routine eye exams. Ask your health care provider how often you should have your eyes checked. Personal lifestyle choices, including: Daily care of your  teeth and gums. Regular physical activity. Eating a healthy diet. Avoiding tobacco and drug use. Limiting alcohol use. Practicing safe sex. Taking low-dose aspirin every day. Taking vitamin and mineral supplements as recommended by your health care provider. What happens during an annual well check? The services and screenings done by your health care provider during your annual well check will depend on your age, overall health, lifestyle risk factors, and family history of disease. Counseling  Your health care provider may ask you questions about your: Alcohol use. Tobacco use. Drug use. Emotional well-being. Home and relationship well-being. Sexual activity. Eating habits. History of falls. Memory and ability to understand (cognition). Work and work Astronomer. Reproductive health. Screening  You may have the following tests or measurements: Height, weight, and BMI. Blood pressure. Lipid and cholesterol levels. These may be checked every 5 years, or more frequently if you are over 43 years old. Skin check. Lung cancer screening. You may have this screening every year starting at age 36 if you have a 30-pack-year history of smoking and currently smoke or have quit within the past 15 years. Fecal occult blood test (FOBT) of the stool. You may have this test every year starting at age 28. Flexible sigmoidoscopy or colonoscopy. You may have a sigmoidoscopy every 5 years or a colonoscopy every 10 years starting at age 25. Hepatitis C  blood test. Hepatitis B blood test. Sexually transmitted disease (STD) testing. Diabetes screening. This is done by checking your blood sugar (glucose) after you have not eaten for a while (fasting). You may have this done every 1-3 years. Bone density scan. This is done to screen for osteoporosis. You may have this done starting at age 15. Mammogram. This may be done every 1-2 years. Talk to your health care provider about how often you should have  regular mammograms. Talk with your health care provider about your test results, treatment options, and if necessary, the need for more tests. Vaccines  Your health care provider may recommend certain vaccines, such as: Influenza vaccine. This is recommended every year. Tetanus, diphtheria, and acellular pertussis (Tdap, Td) vaccine. You may need a Td booster every 10 years. Zoster vaccine. You may need this after age 14. Pneumococcal 13-valent conjugate (PCV13) vaccine. One dose is recommended after age 41. Pneumococcal polysaccharide (PPSV23) vaccine. One dose is recommended after age 38. Talk to your health care provider about which screenings and vaccines you need and how often you need them. This information is not intended to replace advice given to you by your health care provider. Make sure you discuss any questions you have with your health care provider. Document Released: 10/06/2015 Document Revised: 05/29/2016 Document Reviewed: 07/11/2015 Elsevier Interactive Patient Education  2017 [employer].  Fall Prevention in the Home Falls can cause injuries. They can happen to people of all ages. There are many things you can do to make your home safe and to help prevent falls. What can I do on the outside of my home? Regularly fix the edges of walkways and driveways and fix any cracks. Remove anything that might make you trip as you walk through a door, such as a raised step or threshold. Trim any bushes or trees on the path to your home. Use bright outdoor lighting. Clear any walking paths of anything that might make someone trip, such as rocks or tools. Regularly check to see if handrails are loose or broken. Make sure that both sides of any steps have handrails. Any raised decks and porches should have guardrails on the edges. Have any leaves, snow, or ice cleared regularly. Use sand or salt on walking paths during winter. Clean up any spills in your garage right away. This  includes oil or grease spills. What can I do in the bathroom? Use night lights. Install grab bars by the toilet and in the tub and shower. Do not use towel bars as grab bars. Use non-skid mats or decals in the tub or shower. If you need to sit down in the shower, use a plastic, non-slip stool. Keep the floor dry. Clean up any water that spills on the floor as soon as it happens. Remove soap buildup in the tub or shower regularly. Attach bath mats securely with double-sided non-slip rug tape. Do not have throw rugs and other things on the floor that can make you trip. What can I do in the bedroom? Use night lights. Make sure that you have a light by your bed that is easy to reach. Do not use any sheets or blankets that are too big for your bed. They should not hang down onto the floor. Have a firm chair that has side arms. You can use this for support while you get dressed. Do not have throw rugs and other things on the floor that can make you trip. What can I do in the kitchen?  Clean up any spills right away. Avoid walking on wet floors. Keep items that you use a lot in easy-to-reach places. If you need to reach something above you, use a strong step stool that has a grab bar. Keep electrical cords out of the way. Do not use floor polish or wax that makes floors slippery. If you must use wax, use non-skid floor wax. Do not have throw rugs and other things on the floor that can make you trip. What can I do with my stairs? Do not leave any items on the stairs. Make sure that there are handrails on both sides of the stairs and use them. Fix handrails that are broken or loose. Make sure that handrails are as long as the stairways. Check any carpeting to make sure that it is firmly attached to the stairs. Fix any carpet that is loose or worn. Avoid having throw rugs at the top or bottom of the stairs. If you do have throw rugs, attach them to the floor with carpet tape. Make sure that you  have a light switch at the top of the stairs and the bottom of the stairs. If you do not have them, ask someone to add them for you. What else can I do to help prevent falls? Wear shoes that: Do not have high heels. Have rubber bottoms. Are comfortable and fit you well. Are closed at the toe. Do not wear sandals. If you use a stepladder: Make sure that it is fully opened. Do not climb a closed stepladder. Make sure that both sides of the stepladder are locked into place. Ask someone to hold it for you, if possible. Clearly mark and make sure that you can see: Any grab bars or handrails. First and last steps. Where the edge of each step is. Use tools that help you move around (mobility aids) if they are needed. These include: Canes. Walkers. Scooters. Crutches. Turn on the lights when you go into a dark area. Replace any light bulbs as soon as they burn out. Set up your furniture so you have a clear path. Avoid moving your furniture around. If any of your floors are uneven, fix them. If there are any pets around you, be aware of where they are. Review your medicines with your doctor. Some medicines can make you feel dizzy. This can increase your chance of falling. Ask your doctor what other things that you can do to help prevent falls. This information is not intended to replace advice given to you by your health care provider. Make sure you discuss any questions you have with your health care provider. Document Released: 07/06/2009 Document Revised: 02/15/2016 Document Reviewed: 10/14/2014 Elsevier Interactive Patient Education  2017 [employer].

## 2023-03-05 NOTE — Therapy (Signed)
  OUTPATIENT PHYSICAL THERAPY NOTE     Patient Name: Sarah Benton MRN: [id number] DOB:1946/04/20, 77 y.o., female Today's Date: 03/05/2023   Patient arrived to clinic for evaluation, requesting "don't make me dizzy-I drove here." Explained to patient that she was scheduled for a vestibular assessment which would provoke her symptoms. Patient requested not to be seen d/t noting that she would not have someone to drive her after this appointment if she became dizzy. Upon discussion, patient reports that she has not had any dizziness or imbalance since coming home from the hospital. Denies dizziness with HEP given to her from acute care. Patient reports dizziness episodes occur without motion provocation and are spontaneous but denies N/T, weakness, diplopia. Patient was educated on s/sx of stroke and to call 911 if these occur. Patient reported understanding and left without being see per her request.     Anette Guarneri, PT, DPT 03/06/23 9:51 AM  [facility] [address] [place], [address] [place], [place] [address] Phone # [phone] Fax # [phone]

## 2023-03-06 ENCOUNTER — Ambulatory Visit: Payer: Medicare PPO | Admitting: Physical Therapy

## 2023-03-06 DIAGNOSIS — R2990 Unspecified symptoms and signs involving the nervous system: Secondary | ICD-10-CM

## 2023-03-07 ENCOUNTER — Ambulatory Visit: Payer: Medicare PPO

## 2023-04-22 ENCOUNTER — Inpatient Hospital Stay: Payer: Medicare PPO | Admitting: Adult Health

## 2023-04-28 ENCOUNTER — Inpatient Hospital Stay: Payer: Medicare PPO | Admitting: Family Medicine

## 2023-05-14 DIAGNOSIS — H5203 Hypermetropia, bilateral: Secondary | ICD-10-CM | POA: Diagnosis not present

## 2023-05-14 DIAGNOSIS — H524 Presbyopia: Secondary | ICD-10-CM | POA: Diagnosis not present

## 2023-05-14 DIAGNOSIS — H25813 Combined forms of age-related cataract, bilateral: Secondary | ICD-10-CM | POA: Diagnosis not present

## 2023-05-22 DIAGNOSIS — E1169 Type 2 diabetes mellitus with other specified complication: Secondary | ICD-10-CM | POA: Diagnosis not present

## 2023-05-22 DIAGNOSIS — M545 Low back pain, unspecified: Secondary | ICD-10-CM | POA: Diagnosis not present

## 2023-05-22 DIAGNOSIS — Z79899 Other long term (current) drug therapy: Secondary | ICD-10-CM | POA: Diagnosis not present

## 2023-05-22 DIAGNOSIS — R030 Elevated blood-pressure reading, without diagnosis of hypertension: Secondary | ICD-10-CM | POA: Diagnosis not present

## 2023-05-22 DIAGNOSIS — E119 Type 2 diabetes mellitus without complications: Secondary | ICD-10-CM | POA: Diagnosis not present

## 2023-05-22 DIAGNOSIS — E6609 Other obesity due to excess calories: Secondary | ICD-10-CM | POA: Diagnosis not present

## 2023-05-27 DIAGNOSIS — Z79899 Other long term (current) drug therapy: Secondary | ICD-10-CM | POA: Diagnosis not present

## 2023-06-12 ENCOUNTER — Encounter: Payer: Self-pay | Admitting: Nurse Practitioner

## 2023-06-12 ENCOUNTER — Ambulatory Visit: Payer: Medicare PPO | Admitting: Nurse Practitioner

## 2023-06-12 VITALS — BP 124/70 | HR 62 | Temp 97.7°F | Resp 20 | Ht 63.0 in | Wt 182.0 lb

## 2023-06-12 DIAGNOSIS — R601 Generalized edema: Secondary | ICD-10-CM | POA: Diagnosis not present

## 2023-06-12 DIAGNOSIS — K219 Gastro-esophageal reflux disease without esophagitis: Secondary | ICD-10-CM

## 2023-06-12 DIAGNOSIS — E119 Type 2 diabetes mellitus without complications: Secondary | ICD-10-CM | POA: Diagnosis not present

## 2023-06-12 DIAGNOSIS — M545 Low back pain, unspecified: Secondary | ICD-10-CM | POA: Diagnosis not present

## 2023-06-12 DIAGNOSIS — D649 Anemia, unspecified: Secondary | ICD-10-CM | POA: Diagnosis not present

## 2023-06-12 DIAGNOSIS — E782 Mixed hyperlipidemia: Secondary | ICD-10-CM

## 2023-06-12 DIAGNOSIS — E118 Type 2 diabetes mellitus with unspecified complications: Secondary | ICD-10-CM | POA: Diagnosis not present

## 2023-06-12 DIAGNOSIS — Z23 Encounter for immunization: Secondary | ICD-10-CM | POA: Diagnosis not present

## 2023-06-12 DIAGNOSIS — E559 Vitamin D deficiency, unspecified: Secondary | ICD-10-CM | POA: Diagnosis not present

## 2023-06-12 DIAGNOSIS — I10 Essential (primary) hypertension: Secondary | ICD-10-CM | POA: Diagnosis not present

## 2023-06-12 DIAGNOSIS — G8929 Other chronic pain: Secondary | ICD-10-CM

## 2023-06-12 LAB — CBC WITH DIFFERENTIAL/PLATELET
Basophils Absolute: 0.1 10*3/uL (ref 0.0–0.2)
Basos: 1 %
EOS (ABSOLUTE): 0.2 10*3/uL (ref 0.0–0.4)
Eos: 3 %
Hematocrit: 38.5 % (ref 34.0–46.6)
Hemoglobin: 12.2 g/dL (ref 11.1–15.9)
Immature Grans (Abs): 0 10*3/uL (ref 0.0–0.1)
Immature Granulocytes: 0 %
Lymphocytes Absolute: 1.8 10*3/uL (ref 0.7–3.1)
Lymphs: 22 %
MCH: 28.1 pg (ref 26.6–33.0)
MCHC: 31.7 g/dL (ref 31.5–35.7)
MCV: 89 fL (ref 79–97)
Monocytes Absolute: 0.5 10*3/uL (ref 0.1–0.9)
Monocytes: 6 %
Neutrophils Absolute: 5.7 10*3/uL (ref 1.4–7.0)
Neutrophils: 68 %
Platelets: 246 10*3/uL (ref 150–450)
RBC: 4.34 x10E6/uL (ref 3.77–5.28)
RDW: 12.2 % (ref 11.7–15.4)
WBC: 8.4 10*3/uL (ref 3.4–10.8)

## 2023-06-12 LAB — CMP14+EGFR
ALT: 16 IU/L (ref 0–32)
AST: 18 IU/L (ref 0–40)
Albumin: 4.1 g/dL (ref 3.8–4.8)
Alkaline Phosphatase: 113 IU/L (ref 44–121)
BUN/Creatinine Ratio: 22 (ref 12–28)
BUN: 25 mg/dL (ref 8–27)
Bilirubin Total: 0.4 mg/dL (ref 0.0–1.2)
CO2: 24 mmol/L (ref 20–29)
Calcium: 9.6 mg/dL (ref 8.7–10.3)
Chloride: 105 mmol/L (ref 96–106)
Creatinine, Ser: 1.16 mg/dL — ABNORMAL HIGH (ref 0.57–1.00)
Globulin, Total: 1.8 g/dL (ref 1.5–4.5)
Glucose: 92 mg/dL (ref 70–99)
Potassium: 4.4 mmol/L (ref 3.5–5.2)
Sodium: 144 mmol/L (ref 134–144)
Total Protein: 5.9 g/dL — ABNORMAL LOW (ref 6.0–8.5)
eGFR: 48 mL/min/{1.73_m2} — ABNORMAL LOW (ref >59)

## 2023-06-12 LAB — LIPID PANEL
Chol/HDL Ratio: 2.7 ratio (ref 0.0–4.4)
Cholesterol, Total: 149 mg/dL (ref 100–199)
HDL: 55 mg/dL (ref >39)
LDL Chol Calc (NIH): 76 mg/dL (ref 0–99)
Triglycerides: 95 mg/dL (ref 0–149)
VLDL Cholesterol Cal: 18 mg/dL (ref 5–40)

## 2023-06-12 MED ORDER — HYDROCODONE-ACETAMINOPHEN 10-325 MG PO TABS: 1.0000 | ORAL_TABLET | Freq: Four times a day (QID) | ORAL | 0 refills | Status: DC | PRN

## 2023-06-12 MED ORDER — ROSUVASTATIN CALCIUM 20 MG PO TABS: 20.0000 mg | ORAL_TABLET | Freq: Every day | ORAL | 1 refills | Status: DC

## 2023-06-12 MED ORDER — LISINOPRIL-HYDROCHLOROTHIAZIDE 20-12.5 MG PO TABS: 1.0000 | ORAL_TABLET | Freq: Every day | ORAL | 1 refills | Status: DC

## 2023-06-12 MED ORDER — OMEPRAZOLE 20 MG PO CPDR: 20.0000 mg | DELAYED_RELEASE_CAPSULE | Freq: Every day | ORAL | 1 refills | Status: DC

## 2023-06-12 NOTE — Progress Notes (Signed)
Subjective:    Patient ID: Sarah Benton, female    DOB: 1946/07/20, 77 y.o.   MRN: [id number]   Chief Complaint: Medical Management of Chronic Issues    HPI:  Sarah Benton is a 77 y.o. who identifies as a female who was assigned female at birth.   Social history: Lives with: husband Work history: retired   Water engineer in today for follow up of the following chronic medical issues:  1. Diet-controlled diabetes mellitus (HCC) She doe snot check her blood sugars at home. Lab Results  Component Value Date   HGBA1C 6.4 (H) 02/19/2023     2. Essential hypertension, benign No c/o chest pain, sob or headaches. Doe snot check blood pressure at home. BP Readings from Last 3 Encounters:  06/12/23 124/70  02/20/23 (!) 123/47  12/10/22 129/76     3. Mixed hyperlipidemia Doe snot watch diet at home. No dedicated exercise Lab Results  Component Value Date   CHOL 137 02/20/2023   HDL 45 02/20/2023   LDLCALC 67 02/20/2023   TRIG 127 02/20/2023   CHOLHDL 3.0 02/20/2023     4. Gastroesophageal reflux disease, unspecified whether esophagitis present Is on omperazole daily and is doing well.  5. Generalized edema Has intermittently  6. Normocytic anemia No c/o fatigue Lab Results  Component Value Date   HGB 12.0 02/20/2023     7. Vitamin D deficiency Id on daily vitamin d supplement  8. Chronic low back pain, unspecified back pain laterality, unspecified whether sciatica present Pain assessment: Cause of pain- DDD Pain location- low back pain Pain on scale of 1-10- 3/10 currently Frequency- daily What increases pain-standing increases pain What makes pain Better-sitting Effects on ADL - none Any change in general medical condition-none  Current opioids rx- norco 10/325- only takes when absolutely neede. 2-3x a week # meds rx- 30 Effectiveness of current meds-helps Adverse reactions from pain meds-none Morphine equivalent- 40 MME  Pill count  performed-No Last drug screen - never ( high risk q83m, moderate risk q77m, low risk yearly ) Urine drug screen today- No Was the NCCSR reviewed- yes  If yes were their any concerning findings? - no   Overdose risk: 1     No data to display          9.  Morbid obesity No recent weight changes Wt Readings from Last 3 Encounters:  06/12/23 182 lb (82.6 kg)  02/26/23 185 lb (83.9 kg)  12/10/22 193 lb (87.5 kg)   BMI Readings from Last 3 Encounters:  06/12/23 32.24 kg/m  02/26/23 32.77 kg/m  12/10/22 34.85 kg/m    New complaints: Insert SmartText    None today  Allergies  Allergen Reactions   Amoxicillin Diarrhea   Crestor [Rosuvastatin Calcium]     Leg cramps    Septra [Sulfamethoxazole-Trimethoprim]     Nausea    Sulfamethoxazole-Trimethoprim Other (See Comments)   Atorvastatin Other (See Comments)    Myalgias / muscle aches    Outpatient Encounter Medications as of 06/12/2023  Medication Sig   aspirin EC 81 MG tablet Take 1 tablet (81 mg total) by mouth daily. Swallow whole.   Blood Glucose Monitoring Suppl (ACCU-CHEK GUIDE) w/Device KIT USE UP TO FOUR TIMES DAILY AS DIRECTED Dx E11.9   Cholecalciferol (VITAMIN D3) 2000 UNITS capsule Take 2,000 Units by mouth daily.   Coenzyme Q10 (CO Q-10) 200 MG CAPS Take 200 mg by mouth daily.   HYDROcodone-acetaminophen (NORCO) 10-325 MG tablet Take 1 tablet by  mouth every 6 (six) hours as needed.   lisinopril-hydrochlorothiazide (ZESTORETIC) 20-12.5 MG tablet Take 1 tablet by mouth daily.   meclizine (ANTIVERT) 25 MG tablet Take 1 tablet (25 mg total) by mouth 3 (three) times daily as needed for dizziness.   meloxicam (MOBIC) 15 MG tablet TAKE ONE TABLET DAILY   omeprazole (PRILOSEC) 20 MG capsule Take 1 capsule (20 mg total) by mouth daily.   rosuvastatin (CRESTOR) 20 MG tablet Take 1 tablet (20 mg total) by mouth daily.   [DISCONTINUED] ketoconazole 2%-triamcinolone 0.1% 1:2 cream mixture Apply topically 2 (two)  times daily as needed.   No facility-administered encounter medications on file as of 06/12/2023.    Past Surgical History:  Procedure Laterality Date   ABDOMINAL HYSTERECTOMY Bilateral 2001   complete   BREAST BIOPSY  1993   Left/negative   BREAST CYST EXCISION Left    LUMBAR LAMINECTOMY  1998   ruptured disc   TONSILLECTOMY     TUBAL LIGATION  1979    Family History  Problem Relation Age of Onset   Parkinsonism Mother 77   Ovarian cancer Mother    Hypertension Father    Prostate cancer Father 56       met to bone / hip   Arthritis Sister        knee   Diabetes Daughter    Diabetes Son        type 2   Diabetes Grandchild 17       type 1   Colon cancer Neg Hx    Breast cancer Neg Hx       Controlled substance contract: n/a     Review of Systems  Constitutional:  Negative for diaphoresis.  Eyes:  Negative for pain.  Respiratory:  Negative for shortness of breath.   Cardiovascular:  Negative for chest pain, palpitations and leg swelling.  Gastrointestinal:  Negative for abdominal pain.  Endocrine: Negative for polydipsia.  Skin:  Negative for rash.  Neurological:  Negative for dizziness, weakness and headaches.  Hematological:  Does not bruise/bleed easily.  All other systems reviewed and are negative.      Objective:   Physical Exam Vitals and nursing note reviewed.  Constitutional:      General: She is not in acute distress.    Appearance: Normal appearance. She is well-developed.  HENT:     Head: Normocephalic.     Right Ear: Tympanic membrane normal.     Left Ear: Tympanic membrane normal.     Nose: Nose normal.     Mouth/Throat:     Mouth: Mucous membranes are moist.  Eyes:     Pupils: Pupils are equal, round, and reactive to light.  Neck:     Vascular: No carotid bruit or JVD.  Cardiovascular:     Rate and Rhythm: Normal rate and regular rhythm.     Heart sounds: Normal heart sounds.  Pulmonary:     Effort: Pulmonary effort is normal.  No respiratory distress.     Breath sounds: Normal breath sounds. No wheezing or rales.  Chest:     Chest wall: No tenderness.  Abdominal:     General: Bowel sounds are normal. There is no distension or abdominal bruit.     Palpations: Abdomen is soft. There is no hepatomegaly, splenomegaly, mass or pulsatile mass.     Tenderness: There is no abdominal tenderness.  Musculoskeletal:        General: Normal range of motion.     Cervical back:  Normal range of motion and neck supple.  Lymphadenopathy:     Cervical: No cervical adenopathy.  Skin:    General: Skin is warm and dry.  Neurological:     Mental Status: She is alert and oriented to person, place, and time.     Deep Tendon Reflexes: Reflexes are normal and symmetric.  Psychiatric:        Behavior: Behavior normal.        Thought Content: Thought content normal.        Judgment: Judgment normal.     BP 124/70   Pulse 62   Temp 97.7 F (36.5 C) (Temporal)   Resp 20   Ht 5\' 3"  (1.6 m)   Wt 182 lb (82.6 kg)   SpO2 95%   BMI 32.24 kg/m        Assessment & Plan:   Sarah Benton comes in today with chief complaint of Medical Management of Chronic Issues   Diagnosis and orders addressed:  1. Diet-controlled diabetes mellitus (HCC) Continue to wathc carbs in diet - Microalbumin / creatinine urine ratio - Bayer DCA Hb A1c Waived  2. Essential hypertension, benign Discussed diet and exercise for person with BMI >25 Will recheck weight in 3-6 months  - CBC with Differential/Platelet - CMP14+EGFR - lisinopril-hydrochlorothiazide (ZESTORETIC) 20-12.5 MG tablet; Take 1 tablet by mouth daily.  Dispense: 90 tablet; Refill: 1  3. Mixed hyperlipidemia Low fat diet - Lipid panel - rosuvastatin (CRESTOR) 20 MG tablet; Take 1 tablet (20 mg total) by mouth daily.  Dispense: 90 tablet; Refill: 1  4. Gastroesophageal reflux disease, unspecified whether esophagitis present Avoid spicy foods Do not eat 2 hours prior to  bedtime  - omeprazole (PRILOSEC) 20 MG capsule; Take 1 capsule (20 mg total) by mouth daily.  Dispense: 90 capsule; Refill: 1  5. Generalized edema Elevate legs when sitting  6. Normocytic anemia Labs pending  7. Vitamin D deficiency Continue vitamin d suppleemnt  8. Chronic low back pain, unspecified back pain laterality, unspecified whether sciatica present Back exercises  9. Morbid obesity (HCC) Discussed diet and exercise for person with BMI >25 Will recheck weight in 3-6 months    Labs pending Health Maintenance reviewed Diet and exercise encouraged  Follow up plan: 6 months   Mary-Margaret Daphine Deutscher, FNP

## 2023-06-13 LAB — BAYER DCA HB A1C WAIVED: HB A1C (BAYER DCA - WAIVED): 5.7 % — ABNORMAL HIGH (ref 4.8–5.6)

## 2023-06-13 LAB — MICROALBUMIN / CREATININE URINE RATIO
Creatinine, Urine: 87.6 mg/dL
Microalb/Creat Ratio: 19 mg/g creat (ref 0–29)
Microalbumin, Urine: 16.4 ug/mL

## 2023-06-16 ENCOUNTER — Other Ambulatory Visit: Payer: Self-pay | Admitting: Nurse Practitioner

## 2023-09-09 ENCOUNTER — Other Ambulatory Visit: Payer: Self-pay | Admitting: Nurse Practitioner

## 2023-09-09 DIAGNOSIS — K219 Gastro-esophageal reflux disease without esophagitis: Secondary | ICD-10-CM

## 2023-09-25 ENCOUNTER — Other Ambulatory Visit: Payer: Self-pay | Admitting: Nurse Practitioner

## 2023-09-25 DIAGNOSIS — Z1231 Encounter for screening mammogram for malignant neoplasm of breast: Secondary | ICD-10-CM

## 2023-09-25 MED ORDER — NYSTATIN 100000 UNIT/ML MT SUSP: 5.0000 mL | Freq: Four times a day (QID) | OROMUCOSAL | 0 refills | Status: DC

## 2023-10-29 ENCOUNTER — Telehealth: Payer: Medicare PPO | Admitting: Physician Assistant

## 2023-10-29 DIAGNOSIS — Z20828 Contact with and (suspected) exposure to other viral communicable diseases: Secondary | ICD-10-CM | POA: Diagnosis not present

## 2023-10-29 DIAGNOSIS — R6889 Other general symptoms and signs: Secondary | ICD-10-CM

## 2023-10-29 MED ORDER — OSELTAMIVIR PHOSPHATE 75 MG PO CAPS: 75.0000 mg | ORAL_CAPSULE | Freq: Two times a day (BID) | ORAL | 0 refills | Status: AC

## 2023-10-29 NOTE — Patient Instructions (Signed)
 Heron GORMAN Aguas, thank you for joining Lovette Borg, PA-C for today's virtual visit.  While this provider is not your primary care provider (PCP), if your PCP is located in our provider database this encounter information will be shared with them immediately following your visit.   A [facility] MyChart account gives you access to today's visit and all your visits, tests, and labs performed at [facility]  click here if you don't have a [facility] MyChart account or go to [email or web address]  Consent: (Patient) Sarah Benton provided verbal consent for this virtual visit at the beginning of the encounter.  Current Medications:  Current Outpatient Medications:    oseltamivir  (TAMIFLU ) 75 MG capsule, Take 1 capsule (75 mg total) by mouth 2 (two) times daily for 5 days., Disp: 10 capsule, Rfl: 0   aspirin  EC 81 MG tablet, Take 1 tablet (81 mg total) by mouth daily. Swallow whole., Disp: 30 tablet, Rfl: 12   Blood Glucose Monitoring Suppl (ACCU-CHEK GUIDE) w/Device KIT, USE UP TO FOUR TIMES DAILY AS DIRECTED Dx E11.9, Disp: 1 kit, Rfl: 0   Cholecalciferol (VITAMIN D3) 2000 UNITS capsule, Take 2,000 Units by mouth daily., Disp: , Rfl:    Coenzyme Q10 (CO Q-10) 200 MG CAPS, Take 200 mg by mouth daily., Disp: , Rfl:    HYDROcodone -acetaminophen  (NORCO) 10-325 MG tablet, Take 1 tablet by mouth every 6 (six) hours as needed., Disp: 30 tablet, Rfl: 0   lisinopril -hydrochlorothiazide  (ZESTORETIC ) 20-12.5 MG tablet, Take 1 tablet by mouth daily., Disp: 90 tablet, Rfl: 1   meclizine  (ANTIVERT ) 25 MG tablet, Take 1 tablet (25 mg total) by mouth 3 (three) times daily as needed for dizziness., Disp: 30 tablet, Rfl: 0   meloxicam  (MOBIC ) 15 MG tablet, TAKE ONE TABLET DAILY, Disp: 90 tablet, Rfl: 1   nystatin  (MYCOSTATIN ) 100000 UNIT/ML suspension, Take 5 mLs (500,000 Units total) by mouth 4 (four) times daily., Disp: 60 mL, Rfl: 0   omeprazole  (PRILOSEC) 20 MG capsule, TAKE ONE CAPSULE  BY MOUTH DAILY, Disp: 90 capsule, Rfl: 0   rosuvastatin  (CRESTOR ) 20 MG tablet, Take 1 tablet (20 mg total) by mouth daily., Disp: 90 tablet, Rfl: 1   Medications ordered in this encounter:  Meds ordered this encounter  Medications   oseltamivir  (TAMIFLU ) 75 MG capsule    Sig: Take 1 capsule (75 mg total) by mouth 2 (two) times daily for 5 days.    Dispense:  10 capsule    Refill:  0    Supervising Provider:   BLAISE ALEENE KIDD [[id number]]     *If you need refills on other medications prior to your next appointment, please contact your pharmacy*  Follow-Up: Call back or seek an in-person evaluation if the symptoms worsen or if the condition fails to improve as anticipated.  [facility] [phone]  Other Instructions Increase fluids Consider taking Tylenol  for fever and body aches as directed on the bottle. Start medicine as prescribed. Reviewed side effects to Tamiflu  with patient and she verbalized understanding. If not improve or with worsening symptoms then follow up with urgent care clinic or PCP.   If you have been instructed to have an in-person evaluation today at a local Urgent Care facility, please use the link below. It will take you to a list of all of our available [facility], including address, phone number and hours of operation. Please do not delay care.  [facility]  If you or a family  member do not have a primary care provider, use the link below to schedule a visit and establish care. When you choose a [facility] primary care physician or advanced practice provider, you gain a long-term partner in health. Find a Primary Care Provider  Learn more about [facility] in-office and virtual care options: [facility] - Get Care Now

## 2023-10-29 NOTE — Progress Notes (Signed)
 Virtual Visit Consent   Sarah Benton, you are scheduled for a virtual visit with a [facility] provider today. Just as with appointments in the office, your consent must be obtained to participate. Your consent will be active for this visit and any virtual visit you may have with one of our providers in the next 365 days. If you have a MyChart account, a copy of this consent can be sent to you electronically.  As this is a virtual visit, video technology does not allow for your provider to perform a traditional examination. This may limit your provider's ability to fully assess your condition. If your provider identifies any concerns that need to be evaluated in person or the need to arrange testing (such as labs, EKG, etc.), we will make arrangements to do so. Although advances in technology are sophisticated, we cannot ensure that it will always work on either your end or our end. If the connection with a video visit is poor, the visit may have to be switched to a telephone visit. With either a video or telephone visit, we are not always able to ensure that we have a secure connection.  By engaging in this virtual visit, you consent to the provision of healthcare and authorize for your insurance to be billed (if applicable) for the services provided during this visit. Depending on your insurance coverage, you may receive a charge related to this service.  I need to obtain your verbal consent now. Are you willing to proceed with your visit today? Sarah Benton has provided verbal consent on 10/29/2023 for a virtual visit (video or telephone). Sarah Borg, PA-C  Date: 10/29/2023 5:46 PM  Virtual Visit via Video Note   I, Sarah Benton, connected with  Sarah Benton  ([id number], 1946-01-13) on 10/29/23 at  5:45 PM EST by a video-enabled telemedicine application and verified that I am speaking with the correct person using two identifiers.  Location: Patient: Virtual Visit Location Patient:  Home Provider: Virtual Visit Location Provider: Home Office   I discussed the limitations of evaluation and management by telemedicine and the availability of in person appointments. The patient expressed understanding and agreed to proceed.    History of Present Illness: Sarah Benton is a 78 y.o. who identifies as a female who was assigned female at birth, and is being seen today for uri symptoms.  HPI: 78 y/o F presents via video telehealth for c/o cough, congestion, body aches, chills x 1 day and getting worse. +close exposure to flu (son and daughter -in- law). Currently taking Mucinex.   URI     Problems:  Patient Active Problem List   Diagnosis Date Noted   Sinus bradycardia 02/20/2023   CKD (chronic kidney disease), stage III (HCC) 02/20/2023   Chronic back pain 02/20/2023   BPPV (benign paroxysmal positional vertigo) 02/19/2023   Controlled diabetes mellitus type 2 with complications (HCC) 12/10/2022   Vitamin D  deficiency 02/06/2016   Generalized edema 07/28/2015   Normocytic anemia 04/06/2012   GERD 11/08/2010   Mixed hyperlipidemia 12/12/2008   Essential hypertension, benign 12/12/2008   Morbid obesity (HCC) 12/12/2008    Allergies:  Allergies  Allergen Reactions   Amoxicillin  Diarrhea   Crestor  [Rosuvastatin  Calcium ]     Leg cramps    Septra [Sulfamethoxazole-Trimethoprim]     Nausea    Sulfamethoxazole-Trimethoprim Other (See Comments)   Atorvastatin  Other (See Comments)    Myalgias / muscle aches    Medications:  Current Outpatient Medications:  oseltamivir  (TAMIFLU ) 75 MG capsule, Take 1 capsule (75 mg total) by mouth 2 (two) times daily for 5 days., Disp: 10 capsule, Rfl: 0   aspirin  EC 81 MG tablet, Take 1 tablet (81 mg total) by mouth daily. Swallow whole., Disp: 30 tablet, Rfl: 12   Blood Glucose Monitoring Suppl (ACCU-CHEK GUIDE) w/Device KIT, USE UP TO FOUR TIMES DAILY AS DIRECTED Dx E11.9, Disp: 1 kit, Rfl: 0   Cholecalciferol (VITAMIN D3)  2000 UNITS capsule, Take 2,000 Units by mouth daily., Disp: , Rfl:    Coenzyme Q10 (CO Q-10) 200 MG CAPS, Take 200 mg by mouth daily., Disp: , Rfl:    HYDROcodone -acetaminophen  (NORCO) 10-325 MG tablet, Take 1 tablet by mouth every 6 (six) hours as needed., Disp: 30 tablet, Rfl: 0   lisinopril -hydrochlorothiazide  (ZESTORETIC ) 20-12.5 MG tablet, Take 1 tablet by mouth daily., Disp: 90 tablet, Rfl: 1   meclizine  (ANTIVERT ) 25 MG tablet, Take 1 tablet (25 mg total) by mouth 3 (three) times daily as needed for dizziness., Disp: 30 tablet, Rfl: 0   meloxicam  (MOBIC ) 15 MG tablet, TAKE ONE TABLET DAILY, Disp: 90 tablet, Rfl: 1   nystatin  (MYCOSTATIN ) 100000 UNIT/ML suspension, Take 5 mLs (500,000 Units total) by mouth 4 (four) times daily., Disp: 60 mL, Rfl: 0   omeprazole  (PRILOSEC) 20 MG capsule, TAKE ONE CAPSULE BY MOUTH DAILY, Disp: 90 capsule, Rfl: 0   rosuvastatin  (CRESTOR ) 20 MG tablet, Take 1 tablet (20 mg total) by mouth daily., Disp: 90 tablet, Rfl: 1  Observations/Objective: Patient is well-developed, well-nourished in no acute distress.  Resting comfortably at home.  Head is normocephalic, atraumatic.  No labored breathing.  Speech is clear and coherent with logical content.  Patient is alert and oriented at baseline.    Assessment and Plan: 1. Exposure to the flu (Primary) - oseltamivir  (TAMIFLU ) 75 MG capsule; Take 1 capsule (75 mg total) by mouth 2 (two) times daily for 5 days.  Dispense: 10 capsule; Refill: 0  2. Flu-like symptoms - oseltamivir  (TAMIFLU ) 75 MG capsule; Take 1 capsule (75 mg total) by mouth 2 (two) times daily for 5 days.  Dispense: 10 capsule; Refill: 0  Increase fluids Consider taking Tylenol  for fever and body aches as directed on the bottle. Start medicine as prescribed. Reviewed side effects to Tamiflu  with patient and she verbalized understanding. If not improve or with worsening symptoms then follow up with urgent care clinic or PCP. Pt verbalized  understanding and in agreement.    Follow Up Instructions: I discussed the assessment and treatment plan with the patient. The patient was provided an opportunity to ask questions and all were answered. The patient agreed with the plan and demonstrated an understanding of the instructions.  A copy of instructions were sent to the patient via MyChart unless otherwise noted below.   Patient has requested to receive PHI (AVS, Work Notes, etc) pertaining to this video visit through e-mail as they are currently without active MyChart. They have voiced understand that email is not considered secure and their health information could be viewed by someone other than the patient.   The patient was advised to call back or seek an in-person evaluation if the symptoms worsen or if the condition fails to improve as anticipated.    Torrell Krutz, PA-C

## 2023-10-30 ENCOUNTER — Encounter: Payer: Self-pay | Admitting: Physician Assistant

## 2023-10-30 DIAGNOSIS — J069 Acute upper respiratory infection, unspecified: Secondary | ICD-10-CM

## 2023-10-31 MED ORDER — BENZONATATE 100 MG PO CAPS: 100.0000 mg | ORAL_CAPSULE | Freq: Three times a day (TID) | ORAL | 0 refills | Status: DC | PRN

## 2023-11-10 ENCOUNTER — Ambulatory Visit
Admission: RE | Admit: 2023-11-10 | Discharge: 2023-11-10 | Disposition: A | Discharge status: Home / Self Care | Payer: Medicare PPO | Source: Ambulatory Visit | Attending: Nurse Practitioner | Admitting: Nurse Practitioner

## 2023-11-10 DIAGNOSIS — Z1231 Encounter for screening mammogram for malignant neoplasm of breast: Secondary | ICD-10-CM

## 2023-12-08 ENCOUNTER — Other Ambulatory Visit: Payer: Self-pay | Admitting: Nurse Practitioner

## 2023-12-08 NOTE — Telephone Encounter (Signed)
 Ok for refill on creams?

## 2023-12-09 ENCOUNTER — Encounter: Payer: Self-pay | Admitting: Nurse Practitioner

## 2023-12-09 ENCOUNTER — Ambulatory Visit: Payer: Medicare PPO | Admitting: Nurse Practitioner

## 2023-12-09 VITALS — BP 114/73 | HR 59 | Temp 97.4°F | Ht 63.0 in | Wt 181.4 lb

## 2023-12-09 DIAGNOSIS — Z0001 Encounter for general adult medical examination with abnormal findings: Secondary | ICD-10-CM

## 2023-12-09 DIAGNOSIS — K219 Gastro-esophageal reflux disease without esophagitis: Secondary | ICD-10-CM

## 2023-12-09 DIAGNOSIS — E782 Mixed hyperlipidemia: Secondary | ICD-10-CM

## 2023-12-09 DIAGNOSIS — E1169 Type 2 diabetes mellitus with other specified complication: Secondary | ICD-10-CM | POA: Diagnosis not present

## 2023-12-09 DIAGNOSIS — I10 Essential (primary) hypertension: Secondary | ICD-10-CM

## 2023-12-09 DIAGNOSIS — Z23 Encounter for immunization: Secondary | ICD-10-CM

## 2023-12-09 DIAGNOSIS — E119 Type 2 diabetes mellitus without complications: Secondary | ICD-10-CM

## 2023-12-09 DIAGNOSIS — E118 Type 2 diabetes mellitus with unspecified complications: Secondary | ICD-10-CM | POA: Diagnosis not present

## 2023-12-09 LAB — BAYER DCA HB A1C WAIVED: HB A1C (BAYER DCA - WAIVED): 5.3 % (ref 4.8–5.6)

## 2023-12-09 LAB — LIPID PANEL

## 2023-12-09 MED ORDER — LISINOPRIL-HYDROCHLOROTHIAZIDE 20-12.5 MG PO TABS: 1.0000 | ORAL_TABLET | Freq: Every day | ORAL | 1 refills | Status: DC

## 2023-12-09 MED ORDER — OMEPRAZOLE 20 MG PO CPDR: 20.0000 mg | DELAYED_RELEASE_CAPSULE | Freq: Every day | ORAL | 1 refills | Status: DC

## 2023-12-09 MED ORDER — ROSUVASTATIN CALCIUM 20 MG PO TABS: 20.0000 mg | ORAL_TABLET | Freq: Every day | ORAL | 1 refills | Status: DC

## 2023-12-09 NOTE — Patient Instructions (Signed)
Tetanus; Diphtheria (Td) Vaccine Injection What is this medication? TETANUS; DIPHTHERIA VACCINE (TET n [place]; dif THEER ee uh vak SEEN) reduces the risk of tetanus (lockjaw) and diphtheria. It does not treat tetanus or diphtheria. It is still possible to get tetanus or diphtheria after receiving the vaccine, but the symptoms may be less severe or not last as long. It works by helping your immune system learn how to fight off a future infection. This medicine may be used for other purposes; ask your health care provider or pharmacist if you have questions. COMMON BRAND NAME(S): DECAVAC, TDVAX, TENIVAC What should I tell my care team before I take this medication? They need to know if you have any of these conditions: Bleeding disorder Immune system problems Infection with fever Low levels of platelets in the blood An unusual or allergic reaction to diphtheria or tetanus toxoid, latex, other vaccines, other medications, foods, dyes, or preservatives Pregnant or trying to get pregnant Breastfeeding How should I use this medication? This vaccine is injected into a muscle. It is given by your care team. A copy of Vaccine Information Statements will be given before each vaccination. Be sure to read this information carefully each time. This sheet may change often. Talk to your care team about the use of this medication in children. While it may be prescribed to children for selected conditions, precautions do apply. Overdosage: If you think you have taken too much of this medicine contact a poison control center or emergency room at once. NOTE: This medicine is only for you. Do not share this medicine with others. What if I miss a dose? Keep appointments for follow-up doses as directed. It is important not to miss your dose. Call your care team if you are unable to keep an appointment. What may interact with this medication? Adalimumab Anakinra Certain medications that prevent or treat blood clots,  such as daily aspirin, enoxaparin, heparin, ticlopidine, warfarin Infliximab Live virus vaccines Medications that lower your chance of fighting infection Medications to treat cancer Radiopharmaceuticals, such as iodine I-125 or I-131 This list may not describe all possible interactions. Give your health care provider a list of all the medicines, herbs, non-prescription drugs, or dietary supplements you use. Also tell them if you smoke, drink alcohol, or use illegal drugs. Some items may interact with your medicine. What should I watch for while using this medication? Visit your care team regularly. Report any side effects to your care team right away. This vaccine, like all vaccines, may not fully protect everyone. What side effects may I notice from receiving this medication? Side effects that you should report to your care team as soon as possible: Allergic reactions--skin rash, itching, hives, swelling of the face, lips, tongue, or throat Feeling faint or lightheaded Side effects that usually do not require medical attention (report these to your care team if they continue or are bothersome): Fever General discomfort and fatigue Headache Joint pain Loss of appetite Muscle weakness Pain, redness, or irritation at injection site This list may not describe all possible side effects. Call your doctor for medical advice about side effects. You may report side effects to FDA at 1-800-FDA-1088. Where should I keep my medication? This vaccine is only given by your care team. It will not be stored at home. NOTE: This sheet is a summary. It may not cover all possible information. If you have questions about this medicine, talk to your doctor, pharmacist, or health care provider.  2024 Elsevier/Gold Standard (2022-02-20 00:00:00)

## 2023-12-09 NOTE — Progress Notes (Signed)
 Subjective:     Chief Complaint: annual physical   HPI:  Sarah Benton is a 78 y.o. who identifies as a female who was assigned female at birth.   Social history: Lives with: husnband Work history: retired   Water engineer in today for follow up of the following chronic medical issues:  1. Annual physical exam   2. Controlled type 2 diabetes mellitus with complication, without long-term current use of insulin (HCC) She has not been checking her blood sugars. Does try to watch diet Lab Results  Component Value Date   HGBA1C 5.7 (H) 06/12/2023     3. Essential hypertension, benign No c/o chest pain, sob or headache. Does not check blood pressure at home. BP Readings from Last 3 Encounters:  06/12/23 124/70  02/20/23 (!) 123/47  12/10/22 129/76     4. Mixed hyperlipidemia Has been watching diet but doing no exercise. Lab Results  Component Value Date   CHOL 149 06/12/2023   HDL 55 06/12/2023   LDLCALC 76 06/12/2023   TRIG 95 06/12/2023   CHOLHDL 2.7 06/12/2023   The 10-year ASCVD risk score (Arnett DK, et al., 2019) is: 44.1%   5. Gastroesophageal reflux disease without esophagitis Is on omeprazole and is doing good  6. Generalized edema Has some lower ext edema by the end of each day.  7. Vitamin D deficiency Is on daily vitamin d supplement  8. Morbid obesity (HCC) Weight is down  lbs since last visit  Wt Readings from Last 3 Encounters:  12/09/23 181 lb 6.4 oz (82.3 kg)  06/12/23 182 lb (82.6 kg)  02/26/23 185 lb (83.9 kg)   BMI Readings from Last 3 Encounters:  12/09/23 32.13 kg/m  06/12/23 32.24 kg/m  02/26/23 32.77 kg/m       New complaints: None today  Allergies  Allergen Reactions   Amoxicillin Diarrhea   Crestor [Rosuvastatin Calcium]     Leg cramps    Septra [Sulfamethoxazole-Trimethoprim]     Nausea    Sulfamethoxazole-Trimethoprim Other (See Comments)   Atorvastatin Other (See Comments)    Myalgias / muscle aches     Outpatient Encounter Medications as of 12/09/2023  Medication Sig   aspirin EC 81 MG tablet Take 1 tablet (81 mg total) by mouth daily. Swallow whole.   benzonatate (TESSALON) 100 MG capsule Take 1 capsule (100 mg total) by mouth 3 (three) times daily as needed for cough.   Blood Glucose Monitoring Suppl (ACCU-CHEK GUIDE) w/Device KIT USE UP TO FOUR TIMES DAILY AS DIRECTED Dx E11.9   Cholecalciferol (VITAMIN D3) 2000 UNITS capsule Take 2,000 Units by mouth daily.   Coenzyme Q10 (CO Q-10) 200 MG CAPS Take 200 mg by mouth daily.   fluticasone (CUTIVATE) 0.05 % cream APPLY TO THE AFFECTED AREAS UP TO TWICE A DAY AS NEEDED   HYDROcodone-acetaminophen (NORCO) 10-325 MG tablet Take 1 tablet by mouth every 6 (six) hours as needed.   ketoconazole (NIZORAL) 2 % cream APPLY TO THE AFFECTED AREAS UP TO TWICE A DAY AS NEEDED   lisinopril-hydrochlorothiazide (ZESTORETIC) 20-12.5 MG tablet Take 1 tablet by mouth daily.   meclizine (ANTIVERT) 25 MG tablet Take 1 tablet (25 mg total) by mouth 3 (three) times daily as needed for dizziness.   meloxicam (MOBIC) 15 MG tablet TAKE ONE TABLET DAILY   nystatin (MYCOSTATIN) 100000 UNIT/ML suspension Take 5 mLs (500,000 Units total) by mouth 4 (four) times daily.   omeprazole (PRILOSEC) 20 MG capsule TAKE ONE CAPSULE BY MOUTH DAILY  rosuvastatin (CRESTOR) 20 MG tablet Take 1 tablet (20 mg total) by mouth daily.   No facility-administered encounter medications on file as of 12/09/2023.    Past Surgical History:  Procedure Laterality Date   ABDOMINAL HYSTERECTOMY Bilateral 2001   complete   BREAST BIOPSY  1993   Left/negative   BREAST CYST EXCISION Left    LUMBAR LAMINECTOMY  1998   ruptured disc   TONSILLECTOMY     TUBAL LIGATION  1979    Family History  Problem Relation Age of Onset   Parkinsonism Mother 48   Ovarian cancer Mother    Hypertension Father    Prostate cancer Father 26       met to bone / hip   Arthritis Sister        knee   Diabetes  Daughter    Diabetes Son        type 2   Diabetes Grandchild 17       type 1   Colon cancer Neg Hx    Breast cancer Neg Hx       Controlled substance contract: n/a     Review of Systems  Constitutional:  Negative for diaphoresis.  Eyes:  Negative for pain.  Respiratory:  Negative for shortness of breath.   Cardiovascular:  Negative for chest pain, palpitations and leg swelling.  Gastrointestinal:  Negative for abdominal pain.  Endocrine: Negative for polydipsia.  Skin:  Negative for rash.  Neurological:  Negative for dizziness, weakness and headaches.  Hematological:  Does not bruise/bleed easily.  All other systems reviewed and are negative.      Objective:   Physical Exam Vitals and nursing note reviewed.  Constitutional:      General: She is not in acute distress.    Appearance: Normal appearance. She is well-developed.  HENT:     Head: Normocephalic.     Right Ear: Tympanic membrane normal.     Left Ear: Tympanic membrane normal.     Nose: Nose normal.     Mouth/Throat:     Mouth: Mucous membranes are moist.  Eyes:     Pupils: Pupils are equal, round, and reactive to light.  Neck:     Vascular: No carotid bruit or JVD.  Cardiovascular:     Rate and Rhythm: Normal rate and regular rhythm.     Heart sounds: Normal heart sounds.  Pulmonary:     Effort: Pulmonary effort is normal. No respiratory distress.     Breath sounds: Normal breath sounds. No wheezing or rales.  Chest:     Chest wall: No tenderness.  Abdominal:     General: Bowel sounds are normal. There is no distension or abdominal bruit.     Palpations: Abdomen is soft. There is no hepatomegaly, splenomegaly, mass or pulsatile mass.     Tenderness: There is no abdominal tenderness.  Musculoskeletal:        General: Normal range of motion.     Cervical back: Normal range of motion and neck supple.  Lymphadenopathy:     Cervical: No cervical adenopathy.  Skin:    General: Skin is warm and dry.   Neurological:     Mental Status: She is alert and oriented to person, place, and time.     Deep Tendon Reflexes: Reflexes are normal and symmetric.  Psychiatric:        Behavior: Behavior normal.        Thought Content: Thought content normal.  Judgment: Judgment normal.    BP 114/73   Pulse (!) 59   Temp (!) 97.4 F (36.3 C)   Ht 5\' 3"  (1.6 m)   Wt 181 lb 6.4 oz (82.3 kg)   SpO2 95%   BMI 32.13 kg/m    HGBA1c 5.9%           Assessment & Plan:   Sarah Benton comes in today with chief complaint of medical management of chronic issues    Diagnosis and orders addressed:  1. Annual physical exam   2. Controlled type 2 diabetes mellitus with complication, without long-term current use of insulin (HCC) Continue t wtahc carbs in diet - Bayer DCA Hb A1c Waived  3. Essential hypertension, benign Low sodium diet - CBC with Differential/Platelet - CMP14+EGFR - DG Chest 2 View - EKG 12-Lead - lisinopril-hydrochlorothiazide (ZESTORETIC) 20-12.5 MG tablet; Take 1 tablet by mouth daily.  Dispense: 90 tablet; Refill: 1  4. Mixed hyperlipidemia Low fat diet - Lipid panel - rosuvastatin (CRESTOR) 20 MG tablet; Take 1 tablet (20 mg total) by mouth daily.  Dispense: 90 tablet; Refill: 1  5. Gastroesophageal reflux disease without esophagitis Avoid spicy foods Do not eat 2 hours prior to bedtime  - omeprazole (PRILOSEC) 20 MG capsule; Take 1 capsule (20 mg total) by mouth daily.  Dispense: 90 capsule; Refill: 1  6. Generalized edema Elevate legs while sitting  7. Vitamin D deficiency Continue vitamin d daily  8. Morbid obesity (HCC) Discussed diet and exercise for person with BMI >25 Will recheck weight in 3-6 months    Labs pending Health Maintenance reviewed Diet and exercise encouraged  Follow up plan: 6 months   Mary-Margaret Daphine Deutscher, FNP

## 2023-12-10 LAB — LIPID PANEL
Cholesterol, Total: 149 mg/dL (ref 100–199)
HDL: 53 mg/dL (ref >39)
LDL CALC COMMENT:: 2.8 ratio (ref 0.0–4.4)
LDL Chol Calc (NIH): 76 mg/dL (ref 0–99)
Triglycerides: 108 mg/dL (ref 0–149)
VLDL Cholesterol Cal: 20 mg/dL (ref 5–40)

## 2023-12-10 LAB — CMP14+EGFR
ALT: 11 IU/L (ref 0–32)
AST: 14 IU/L (ref 0–40)
Albumin: 4 g/dL (ref 3.8–4.8)
Alkaline Phosphatase: 113 IU/L (ref 44–121)
BUN/Creatinine Ratio: 21 (ref 12–28)
BUN: 23 mg/dL (ref 8–27)
Bilirubin Total: 0.3 mg/dL (ref 0.0–1.2)
CO2: 24 mmol/L (ref 20–29)
Calcium: 9.3 mg/dL (ref 8.7–10.3)
Chloride: 106 mmol/L (ref 96–106)
Creatinine, Ser: 1.1 mg/dL — ABNORMAL HIGH (ref 0.57–1.00)
Globulin, Total: 1.8 g/dL (ref 1.5–4.5)
Glucose: 98 mg/dL (ref 70–99)
Potassium: 4.5 mmol/L (ref 3.5–5.2)
Sodium: 144 mmol/L (ref 134–144)
Total Protein: 5.8 g/dL — ABNORMAL LOW (ref 6.0–8.5)
eGFR: 51 mL/min/{1.73_m2} — ABNORMAL LOW (ref >59)

## 2023-12-10 LAB — CBC WITH DIFFERENTIAL/PLATELET
Basophils Absolute: 0.1 10*3/uL (ref 0.0–0.2)
Basos: 1 %
EOS (ABSOLUTE): 0.2 10*3/uL (ref 0.0–0.4)
Eos: 3 %
Hematocrit: 38.9 % (ref 34.0–46.6)
Hemoglobin: 12.6 g/dL (ref 11.1–15.9)
Immature Grans (Abs): 0 10*3/uL (ref 0.0–0.1)
Immature Granulocytes: 0 %
Lymphocytes Absolute: 1.9 10*3/uL (ref 0.7–3.1)
Lymphs: 22 %
MCH: 28.2 pg (ref 26.6–33.0)
MCHC: 32.4 g/dL (ref 31.5–35.7)
MCV: 87 fL (ref 79–97)
Monocytes Absolute: 0.6 10*3/uL (ref 0.1–0.9)
Monocytes: 7 %
Neutrophils Absolute: 6 10*3/uL (ref 1.4–7.0)
Neutrophils: 67 %
Platelets: 244 10*3/uL (ref 150–450)
RBC: 4.47 x10E6/uL (ref 3.77–5.28)
RDW: 13.2 % (ref 11.7–15.4)
WBC: 8.8 10*3/uL (ref 3.4–10.8)

## 2024-04-16 DIAGNOSIS — H10013 Acute follicular conjunctivitis, bilateral: Secondary | ICD-10-CM | POA: Diagnosis not present

## 2024-05-06 ENCOUNTER — Encounter: Payer: Self-pay | Admitting: Family Medicine

## 2024-05-06 ENCOUNTER — Ambulatory Visit: Admitting: Family Medicine

## 2024-05-06 ENCOUNTER — Ambulatory Visit: Payer: Self-pay | Admitting: Family Medicine

## 2024-05-06 VITALS — BP 124/71 | HR 59 | Temp 97.1°F | Ht 63.0 in | Wt 182.6 lb

## 2024-05-06 DIAGNOSIS — M5441 Lumbago with sciatica, right side: Secondary | ICD-10-CM | POA: Diagnosis not present

## 2024-05-06 DIAGNOSIS — R300 Dysuria: Secondary | ICD-10-CM | POA: Diagnosis not present

## 2024-05-06 DIAGNOSIS — N3001 Acute cystitis with hematuria: Secondary | ICD-10-CM | POA: Diagnosis not present

## 2024-05-06 LAB — MICROSCOPIC EXAMINATION
Renal Epithel, UA: NONE SEEN /HPF
WBC, UA: >30 /HPF — AB (ref 0–5)
Yeast, UA: NONE SEEN

## 2024-05-06 LAB — URINALYSIS, ROUTINE W REFLEX MICROSCOPIC
Bilirubin, UA: NEGATIVE
Nitrite, UA: POSITIVE — AB
Specific Gravity, UA: 1.015 (ref 1.005–1.030)
Urobilinogen, Ur: 1 mg/dL (ref 0.2–1.0)
pH, UA: 5 (ref 5.0–7.5)

## 2024-05-06 MED ORDER — PREDNISONE 20 MG PO TABS: 40.0000 mg | ORAL_TABLET | Freq: Every day | ORAL | 0 refills | Status: AC

## 2024-05-06 MED ORDER — CEPHALEXIN 500 MG PO CAPS: 500.0000 mg | ORAL_CAPSULE | Freq: Two times a day (BID) | ORAL | 0 refills | Status: AC

## 2024-05-06 NOTE — Progress Notes (Signed)
 Subjective:  Patient ID: Sarah Benton, female    DOB: September 21, 1946, 78 y.o.   MRN: [id number]  Patient Care Team: Gladis Mustard, FNP as PCP - General (Nurse Practitioner) Austin Ned, MD as Consulting Physician (Obstetrics and Gynecology) Aneita Gwendlyn DASEN, MD (Inactive) as Consulting Physician (Gastroenterology) Roddie Bring, DPM as Consulting Physician (Podiatry) Pa, [facility] (Ophthalmology)   Chief Complaint:  Urinary Frequency (X 3 days. Patient has been taking azo x 2 days ) and Back Pain (Patient bent over yesterday and when she did she felt a sharpe pain in her lower back.  )   HPI: Sarah Benton is a 78 y.o. female presenting on 05/06/2024 for Urinary Frequency (X 3 days. Patient has been taking azo x 2 days ) and Back Pain (Patient bent over yesterday and when she did she felt a sharpe pain in her lower back.  )   Sarah Benton is an 78 year old female who presents with urinary symptoms and acute back pain.  She experiences urinary symptoms characterized by a frequent urge to urinate every five minutes and dysuria. There is a sensation of pressure in the lower abdomen and suprapubic area. She has tried using Azo for relief, but it has not been effective. She notes a chill sensation when urinating but has no fever, chills, confusion, or weakness at other times. She has a history of urinary tract infections, with the last occurrence in 2023.  She reports acute back pain that began last night, describing it as a sharp, excruciating pain in the sacroiliac region, particularly when moving her right leg. She likens the pain to a pinched nerve. No loss of bowel or bladder function and no numbness in the genital area. She took hydrocodone last night, which provided relief, but she avoids taking it frequently. She has a history of degenerative disc disease and had surgery for a ruptured disc in 1998. She has experienced ongoing pain in the sacroiliac area for  several years, which she describes as 'bone rubbing against bone' pain, but notes that the current pain is different, resembling a pinched nerve.          Relevant past medical, surgical, family, and social history reviewed and updated as indicated.  Allergies and medications reviewed and updated. Data reviewed: Chart in Epic.   Past Medical History:  Diagnosis Date   Anemia    Iron def   Arthritis    Cataract    Edema    Esophageal stricture    GERD (gastroesophageal reflux disease)    Hyperlipidemia    Hypertension    Obesity    Ruptured lumbar disc     Past Surgical History:  Procedure Laterality Date   ABDOMINAL HYSTERECTOMY Bilateral 2001   complete   BREAST BIOPSY  1993   Left/negative   BREAST CYST EXCISION Left    LUMBAR LAMINECTOMY  1998   ruptured disc   TONSILLECTOMY     TUBAL LIGATION  1979    Social History   Socioeconomic History   Marital status: Widowed    Spouse name: Not on file   Number of children: 2   Years of education: 46   Highest education level: 12th grade  Occupational History   Occupation: Retired    Comment: School system  Tobacco Use   Smoking status: Never   Smokeless tobacco: Never  Vaping Use   Vaping status: Never Used  Substance and Sexual Activity   Alcohol use: No  Alcohol/week: 0.0 standard drinks of alcohol   Drug use: No   Sexual activity: Not Currently  Other Topics Concern   Not on file  Social History Narrative   Not on file   Social Drivers of Health   Financial Resource Strain: Low Risk  (12/08/2023)   Overall Financial Resource Strain (CARDIA)    Difficulty of Paying Living Expenses: Not hard at all  Food Insecurity: No Food Insecurity (12/08/2023)   Hunger Vital Sign    Worried About Running Out of Food in the Last Year: Never true    Ran Out of Food in the Last Year: Never true  Transportation Needs: No Transportation Needs (12/08/2023)   PRAPARE - Administrator, Civil Service  (Medical): No    Lack of Transportation (Non-Medical): No  Physical Activity: Inactive (02/26/2023)   Exercise Vital Sign    Days of Exercise per Week: 0 days    Minutes of Exercise per Session: 0 min  Stress: No Stress Concern Present (02/26/2023)   [employer] of Occupational Health - Occupational Stress Questionnaire    Feeling of Stress : Not at all  Social Connections: Socially Isolated (12/08/2023)   Social Connection and Isolation Panel    Frequency of Communication with Friends and Family: More than three times a week    Frequency of Social Gatherings with Friends and Family: Once a week    Attends Religious Services: Never    Database administrator or Organizations: No    Attends Engineer, structural: Not on file    Marital Status: Widowed  Intimate Partner Violence: Not At Risk (02/26/2023)   Humiliation, Afraid, Rape, and Kick questionnaire    Fear of Current or Ex-Partner: No    Emotionally Abused: No    Physically Abused: No    Sexually Abused: No    Outpatient Encounter Medications as of 05/06/2024  Medication Sig   aspirin EC 81 MG tablet Take 1 tablet (81 mg total) by mouth daily. Swallow whole.   Blood Glucose Monitoring Suppl (ACCU-CHEK GUIDE) w/Device KIT USE UP TO FOUR TIMES DAILY AS DIRECTED Dx E11.9   cephALEXin (KEFLEX) 500 MG capsule Take 1 capsule (500 mg total) by mouth 2 (two) times daily for 7 days.   Cholecalciferol (VITAMIN D3) 2000 UNITS capsule Take 2,000 Units by mouth daily.   Coenzyme Q10 (CO Q-10) 200 MG CAPS Take 200 mg by mouth daily.   fluticasone (CUTIVATE) 0.05 % cream APPLY TO THE AFFECTED AREAS UP TO TWICE A DAY AS NEEDED   ketoconazole (NIZORAL) 2 % cream APPLY TO THE AFFECTED AREAS UP TO TWICE A DAY AS NEEDED   lisinopril-hydrochlorothiazide (ZESTORETIC) 20-12.5 MG tablet Take 1 tablet by mouth daily.   meloxicam (MOBIC) 15 MG tablet TAKE ONE TABLET DAILY   omeprazole (PRILOSEC) 20 MG capsule Take 1 capsule (20 mg total) by mouth  daily.   predniSONE (DELTASONE) 20 MG tablet Take 2 tablets (40 mg total) by mouth daily with breakfast for 5 days.   rosuvastatin (CRESTOR) 20 MG tablet Take 1 tablet (20 mg total) by mouth daily.   meclizine (ANTIVERT) 25 MG tablet Take 1 tablet (25 mg total) by mouth 3 (three) times daily as needed for dizziness. (Patient not taking: Reported on 05/06/2024)   No facility-administered encounter medications on file as of 05/06/2024.    Allergies  Allergen Reactions   Amoxicillin Diarrhea   Crestor [Rosuvastatin Calcium]     Leg cramps    Septra [Sulfamethoxazole-Trimethoprim]  Nausea    Sulfamethoxazole-Trimethoprim Other (See Comments)   Atorvastatin Other (See Comments)    Myalgias / muscle aches     Pertinent ROS per HPI, otherwise unremarkable      Objective:  BP 124/71   Pulse (!) 59   Temp (!) 97.1 F (36.2 C)   Ht 5' 3 (1.6 m)   Wt 182 lb 9.6 oz (82.8 kg)   SpO2 96%   BMI 32.35 kg/m    Wt Readings from Last 3 Encounters:  05/06/24 182 lb 9.6 oz (82.8 kg)  12/09/23 181 lb 6.4 oz (82.3 kg)  06/12/23 182 lb (82.6 kg)    Physical Exam Vitals and nursing note reviewed.  Constitutional:      General: She is not in acute distress.    Appearance: Normal appearance. She is obese. She is not ill-appearing, toxic-appearing or diaphoretic.  HENT:     Head: Normocephalic and atraumatic.     Mouth/Throat:     Mouth: Mucous membranes are moist.  Eyes:     Pupils: Pupils are equal, round, and reactive to light.  Cardiovascular:     Rate and Rhythm: Regular rhythm. Bradycardia present.     Heart sounds: Normal heart sounds.  Pulmonary:     Effort: Pulmonary effort is normal.     Breath sounds: Normal breath sounds.  Abdominal:     General: Bowel sounds are normal.     Palpations: Abdomen is soft.     Tenderness: There is abdominal tenderness in the suprapubic area. There is no right CVA tenderness, left CVA tenderness, guarding or rebound. Negative signs  include Murphy's sign, Rovsing's sign, McBurney's sign, psoas sign and obturator sign.  Musculoskeletal:     Cervical back: Neck supple.     Thoracic back: Normal.     Lumbar back: Tenderness present. No swelling, edema, deformity, signs of trauma, lacerations, spasms or bony tenderness. Decreased range of motion. Positive right straight leg raise test. Negative left straight leg raise test. No scoliosis.       Back:     Right lower leg: No edema.     Left lower leg: No edema.  Skin:    General: Skin is warm and dry.     Capillary Refill: Capillary refill takes less than 2 seconds.  Neurological:     General: No focal deficit present.     Mental Status: She is alert and oriented to person, place, and time.  Psychiatric:        Mood and Affect: Mood normal.        Behavior: Behavior normal.        Thought Content: Thought content normal.        Judgment: Judgment normal.     Results for orders placed or performed in visit on 12/09/23  Bayer DCA Hb A1c Waived   Collection Time: 12/09/23  9:53 AM  Result Value Ref Range   HB A1C (BAYER DCA - WAIVED) 5.3 4.8 - 5.6 %  CBC with Differential/Platelet   Collection Time: 12/09/23  9:59 AM  Result Value Ref Range   WBC 8.8 3.4 - 10.8 x10E3/uL   RBC 4.47 3.77 - 5.28 x10E6/uL   Hemoglobin 12.6 11.1 - 15.9 g/dL   Hematocrit 61.0 65.9 - 46.6 %   MCV 87 79 - 97 fL   MCH 28.2 26.6 - 33.0 pg   MCHC 32.4 31.5 - 35.7 g/dL   RDW 86.7 88.2 - 84.5 %   Platelets 244 150 - 450  x10E3/uL   Neutrophils 67 Not Estab. %   Lymphs 22 Not Estab. %   Monocytes 7 Not Estab. %   Eos 3 Not Estab. %   Basos 1 Not Estab. %   Neutrophils Absolute 6.0 1.4 - 7.0 x10E3/uL   Lymphocytes Absolute 1.9 0.7 - 3.1 x10E3/uL   Monocytes Absolute 0.6 0.1 - 0.9 x10E3/uL   EOS (ABSOLUTE) 0.2 0.0 - 0.4 x10E3/uL   Basophils Absolute 0.1 0.0 - 0.2 x10E3/uL   Immature Granulocytes 0 Not Estab. %   Immature Grans (Abs) 0.0 0.0 - 0.1 x10E3/uL  CMP14+EGFR   Collection  Time: 12/09/23  9:59 AM  Result Value Ref Range   Glucose 98 70 - 99 mg/dL   BUN 23 8 - 27 mg/dL   Creatinine, Ser 8.89 (H) 0.57 - 1.00 mg/dL   eGFR 51 (L) >40 fO/fpw/8.26   BUN/Creatinine Ratio 21 12 - 28   Sodium 144 134 - 144 mmol/L   Potassium 4.5 3.5 - 5.2 mmol/L   Chloride 106 96 - 106 mmol/L   CO2 24 20 - 29 mmol/L   Calcium 9.3 8.7 - 10.3 mg/dL   Total Protein 5.8 (L) 6.0 - 8.5 g/dL   Albumin 4.0 3.8 - 4.8 g/dL   Globulin, Total 1.8 1.5 - 4.5 g/dL   Bilirubin Total 0.3 0.0 - 1.2 mg/dL   Alkaline Phosphatase 113 44 - 121 IU/L   AST 14 0 - 40 IU/L   ALT 11 0 - 32 IU/L  Lipid panel   Collection Time: 12/09/23  9:59 AM  Result Value Ref Range   Cholesterol, Total 149 100 - 199 mg/dL   Triglycerides 891 0 - 149 mg/dL   HDL 53 >60 mg/dL   VLDL Cholesterol Cal 20 5 - 40 mg/dL   LDL Chol Calc (NIH) 76 0 - 99 mg/dL   Chol/HDL Ratio 2.8 0.0 - 4.4 ratio       Pertinent labs & imaging results that were available during my care of the patient were reviewed by me and considered in my medical decision making.  Assessment & Plan:  Sarah Benton was seen today for urinary frequency and back pain.  Diagnoses and all orders for this visit:  Acute cystitis with hematuria -     Urine Culture -     Urinalysis, Routine w reflex microscopic -     cephALEXin (KEFLEX) 500 MG capsule; Take 1 capsule (500 mg total) by mouth 2 (two) times daily for 7 days.  Dysuria -     Urine Culture -     Urinalysis, Routine w reflex microscopic  Acute bilateral low back pain with right-sided sciatica -     predniSONE (DELTASONE) 20 MG tablet; Take 2 tablets (40 mg total) by mouth daily with breakfast for 5 days.       Acute right sacroiliac and proximal right thigh pain (possible pinched nerve) Acute onset of severe right sacroiliac and proximal right thigh pain, described as sharp and excruciating, possibly due to a pinched nerve. No loss of bowel or bladder function, and no saddle anesthesia, ruling  out cauda equina syndrome. Degenerative disc disease and previous ruptured disc surgery noted. Pain exacerbated by movement, suggesting nerve involvement. - Prescribe a burst of prednisone, two pills every morning for five days, due to its anti-inflammatory properties, which are more effective for nerve pain than hydrocodone. - Advise to report if symptoms worsen or do not improve. - Consider imaging if symptoms persist or worsen.  Degenerative disc  disease of the lumbar spine Chronic lumbar spine degenerative disc disease with previous surgery for a ruptured disc in 1998. Ongoing sacroiliac region pain, typically described as bone rubbing against bone, but currently experiencing a different type of pain suggestive of nerve involvement.  Urinary tract infection Acute urinary tract infection with symptoms of frequent urination, dysuria, and suprapubic pressure. Positive leukocytes and nitrites on urinalysis. Previous urine cultures in 2023 showed E. Coli susceptible to all antibiotics tested, and in 2022 showed Klebsiella also susceptible to all antibiotics tested. Allergies to amoxicillin, Bactrim, and Septra noted. Discussion on avoiding fluoroquinolones like Cipro due to risks of tendon rupture and aneurysm, unless necessary. Keflex is chosen based on previous culture susceptibilities and lower risk profile compared to Cipro. - Prescribe Keflex twice a day for seven days. - Perform urine culture to confirm antibiotic susceptibility. - Adjust antibiotics if culture results indicate resistance to Keflex.          Continue all other maintenance medications.  Follow up plan: Return if symptoms worsen or fail to improve.   Continue healthy lifestyle choices, including diet (rich in fruits, vegetables, and lean proteins, and low in salt and simple carbohydrates) and exercise (at least 30 minutes of moderate physical activity daily).   The above assessment and management plan was discussed with  the patient. The patient verbalized understanding of and has agreed to the management plan. Patient is aware to call the clinic if they develop any new symptoms or if symptoms persist or worsen. Patient is aware when to return to the clinic for a follow-up visit. Patient educated on when it is appropriate to go to the emergency department.   Rosaline Bruns, FNP-C [facility] [phone]

## 2024-05-08 LAB — URINE CULTURE

## 2024-06-02 DIAGNOSIS — H524 Presbyopia: Secondary | ICD-10-CM | POA: Diagnosis not present

## 2024-06-02 DIAGNOSIS — H25813 Combined forms of age-related cataract, bilateral: Secondary | ICD-10-CM | POA: Diagnosis not present

## 2024-06-02 DIAGNOSIS — H11431 Conjunctival hyperemia, right eye: Secondary | ICD-10-CM | POA: Diagnosis not present

## 2024-06-02 DIAGNOSIS — H5203 Hypermetropia, bilateral: Secondary | ICD-10-CM | POA: Diagnosis not present

## 2024-06-08 ENCOUNTER — Ambulatory Visit: Admitting: Nurse Practitioner

## 2024-06-08 ENCOUNTER — Encounter: Payer: Self-pay | Admitting: Nurse Practitioner

## 2024-06-08 VITALS — BP 114/55 | HR 58 | Temp 97.5°F | Ht 63.0 in | Wt 183.0 lb

## 2024-06-08 DIAGNOSIS — E785 Hyperlipidemia, unspecified: Secondary | ICD-10-CM | POA: Diagnosis not present

## 2024-06-08 DIAGNOSIS — E1169 Type 2 diabetes mellitus with other specified complication: Secondary | ICD-10-CM | POA: Diagnosis not present

## 2024-06-08 DIAGNOSIS — N1831 Chronic kidney disease, stage 3a: Secondary | ICD-10-CM

## 2024-06-08 DIAGNOSIS — D649 Anemia, unspecified: Secondary | ICD-10-CM | POA: Diagnosis not present

## 2024-06-08 DIAGNOSIS — E1122 Type 2 diabetes mellitus with diabetic chronic kidney disease: Secondary | ICD-10-CM | POA: Diagnosis not present

## 2024-06-08 DIAGNOSIS — N183 Chronic kidney disease, stage 3 unspecified: Secondary | ICD-10-CM | POA: Diagnosis not present

## 2024-06-08 DIAGNOSIS — K219 Gastro-esophageal reflux disease without esophagitis: Secondary | ICD-10-CM | POA: Diagnosis not present

## 2024-06-08 DIAGNOSIS — E559 Vitamin D deficiency, unspecified: Secondary | ICD-10-CM | POA: Diagnosis not present

## 2024-06-08 DIAGNOSIS — Z23 Encounter for immunization: Secondary | ICD-10-CM | POA: Diagnosis not present

## 2024-06-08 DIAGNOSIS — I10 Essential (primary) hypertension: Secondary | ICD-10-CM | POA: Diagnosis not present

## 2024-06-08 LAB — LIPID PANEL

## 2024-06-08 LAB — BAYER DCA HB A1C WAIVED: HB A1C (BAYER DCA - WAIVED): 5.8 % — ABNORMAL HIGH (ref 4.8–5.6)

## 2024-06-08 MED ORDER — OMEPRAZOLE 20 MG PO CPDR: 20.0000 mg | DELAYED_RELEASE_CAPSULE | Freq: Every day | ORAL | 1 refills | Status: AC

## 2024-06-08 MED ORDER — ROSUVASTATIN CALCIUM 20 MG PO TABS: 20.0000 mg | ORAL_TABLET | Freq: Every day | ORAL | 1 refills | Status: AC

## 2024-06-08 MED ORDER — MECLIZINE HCL 25 MG PO TABS: 25.0000 mg | ORAL_TABLET | Freq: Three times a day (TID) | ORAL | 1 refills | Status: AC | PRN

## 2024-06-08 MED ORDER — LISINOPRIL-HYDROCHLOROTHIAZIDE 20-12.5 MG PO TABS: 1.0000 | ORAL_TABLET | Freq: Every day | ORAL | 1 refills | Status: AC

## 2024-06-08 NOTE — Patient Instructions (Signed)

## 2024-06-08 NOTE — Progress Notes (Signed)
 Subjective:    Patient ID: Sarah Benton, female    DOB: 1946/03/12, 78 y.o.   MRN: [id number]   Chief Complaint: medical management of chronic issues     HPI:  Sarah Benton is a 78 y.o. who identifies as a female who was assigned female at birth.   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 Essential hypertension, benign No c/o chest pain, sob or headache. Does not check blood pressure at home. BP Readings from Last 3 Encounters:  05/06/24 124/71  12/09/23 114/73  06/12/23 124/70     2. Mixed hyperlipidemia Does not watch diet and does no dedicated exercise. Lab Results  Component Value Date   CHOL 149 12/09/2023   HDL 53 12/09/2023   LDLCALC 76 12/09/2023   TRIG 108 12/09/2023   CHOLHDL 2.8 12/09/2023   The ASCVD Risk score (Arnett DK, et al., 2019) failed to calculate for the following reasons:   The 2019 ASCVD risk score is only valid for ages 52 to 70   3. Diabetes controlled with oral meds with kidney complications She has not been checking her blood sugars at home. She is currently doing diet control. Lab Results  Component Value Date   HGBA1C 5.3 12/09/2023      4. Normocytic anemia Is not on iron supplement. Denies fatigue Lab Results  Component Value Date   HGB 12.6 12/09/2023     5. Gastroesophageal reflux disease without esophagitis Is on omeprazole  daily and is doing well.  6. Vitamin D  deficiency Is on daily vitamin d  supplement  7. Generalized edema Has edema in the evening occasionally  8. Morbid obesity (HCC) No recent weight changes  Wt Readings from Last 3 Encounters:  06/08/24 183 lb (83 kg)  05/06/24 182 lb 9.6 oz (82.8 kg)  12/09/23 181 lb 6.4 oz (82.3 kg)   BMI Readings from Last 3 Encounters:  06/08/24 32.42 kg/m  05/06/24 32.35 kg/m  12/09/23 32.13 kg/m      New  complaints: None today  Allergies  Allergen Reactions   Amoxicillin  Diarrhea   Crestor  [Rosuvastatin  Calcium ]     Leg cramps    Septra [Sulfamethoxazole-Trimethoprim]     Nausea    Sulfamethoxazole-Trimethoprim Other (See Comments)   Atorvastatin  Other (See Comments)    Myalgias / muscle aches    Outpatient Encounter Medications as of 06/08/2024  Medication Sig   aspirin  EC 81 MG tablet Take 1 tablet (81 mg total) by mouth daily. Swallow whole.   Blood Glucose Monitoring Suppl (ACCU-CHEK GUIDE) w/Device KIT USE UP TO FOUR TIMES DAILY AS DIRECTED Dx E11.9   Cholecalciferol (VITAMIN D3) 2000 UNITS capsule Take 2,000 Units by mouth daily.   Coenzyme Q10 (CO Q-10) 200 MG CAPS Take 200 mg by mouth daily.   fluticasone  (CUTIVATE ) 0.05 % cream APPLY TO THE AFFECTED AREAS UP TO TWICE A DAY AS NEEDED   ketoconazole (NIZORAL) 2 % cream APPLY TO THE AFFECTED AREAS UP TO TWICE A DAY AS NEEDED   lisinopril -hydrochlorothiazide  (ZESTORETIC ) 20-12.5 MG tablet Take 1 tablet by mouth daily.   meclizine  (ANTIVERT ) 25 MG tablet Take 1 tablet (25 mg total) by mouth 3 (three) times daily as needed for dizziness. (Patient not taking: Reported on 05/06/2024)   meloxicam  (MOBIC ) 15 MG tablet TAKE ONE TABLET DAILY   omeprazole  (PRILOSEC) 20 MG capsule Take  1 capsule (20 mg total) by mouth daily.   rosuvastatin  (CRESTOR ) 20 MG tablet Take 1 tablet (20 mg total) by mouth daily.   No facility-administered encounter medications on file as of 06/08/2024.    Past Surgical History:  Procedure Laterality Date   ABDOMINAL HYSTERECTOMY Bilateral 2001   complete   BREAST BIOPSY  1993   Left/negative   BREAST CYST EXCISION Left    LUMBAR LAMINECTOMY  1998   ruptured disc   TONSILLECTOMY     TUBAL LIGATION  1979    Family History  Problem Relation Age of Onset   Parkinsonism Mother 24   Ovarian cancer Mother    Hypertension Father    Prostate cancer Father 79       met to bone / hip   Arthritis Sister         knee   Diabetes Daughter    Diabetes Son        type 2   Diabetes Grandchild 17       type 1   Colon cancer Neg Hx    Breast cancer Neg Hx       Controlled substance contract: n/a     Review of Systems  Constitutional:  Negative for diaphoresis.  Eyes:  Negative for pain.  Respiratory:  Negative for shortness of breath.   Cardiovascular:  Negative for chest pain, palpitations and leg swelling.  Gastrointestinal:  Negative for abdominal pain.  Endocrine: Negative for polydipsia.  Skin:  Negative for rash.  Neurological:  Negative for dizziness, weakness and headaches.  Hematological:  Does not bruise/bleed easily.  All other systems reviewed and are negative.      Objective:   Physical Exam Vitals and nursing note reviewed.  Constitutional:      General: She is not in acute distress.    Appearance: Normal appearance. She is well-developed.  HENT:     Head: Normocephalic.     Right Ear: Tympanic membrane normal.     Left Ear: Tympanic membrane normal.     Nose: Nose normal.     Mouth/Throat:     Mouth: Mucous membranes are moist.  Eyes:     Pupils: Pupils are equal, round, and reactive to light.  Neck:     Vascular: No carotid bruit or JVD.  Cardiovascular:     Rate and Rhythm: Normal rate and regular rhythm.     Heart sounds: Normal heart sounds.  Pulmonary:     Effort: Pulmonary effort is normal. No respiratory distress.     Breath sounds: Normal breath sounds. No wheezing or rales.  Chest:     Chest wall: No tenderness.  Abdominal:     General: Bowel sounds are normal. There is no distension or abdominal bruit.     Palpations: Abdomen is soft. There is no hepatomegaly, splenomegaly, mass or pulsatile mass.     Tenderness: There is no abdominal tenderness.  Musculoskeletal:        General: Normal range of motion.     Cervical back: Normal range of motion and neck supple.  Lymphadenopathy:     Cervical: No cervical adenopathy.  Skin:    General:  Skin is warm and dry.  Neurological:     Mental Status: She is alert and oriented to person, place, and time.     Deep Tendon Reflexes: Reflexes are normal and symmetric.  Psychiatric:        Behavior: Behavior normal.        Thought Content:  Thought content normal.        Judgment: Judgment normal.    BP (!) 114/55   Pulse (!) 58   Temp (!) 97.5 F (36.4 C) (Temporal)   Ht 5' 3 (1.6 m)   Wt 183 lb (83 kg)   SpO2 97%   BMI 32.42 kg/m    Hgba1c 5.8%       Assessment & Plan:   VALEEN BORYS comes in today with chief complaint of Medical Management of Chronic Issues   Diagnosis and orders addressed:  1. Essential hypertension, benign Low sodium diet - CBC with Differential/Platelet - CMP14+EGFR - lisinopril -hydrochlorothiazide  (ZESTORETIC ) 20-12.5 MG tablet; Take 1 tablet by mouth daily.  Dispense: 90 tablet; Refill: 1  2. Hyperlipidemia associated with type 2 diabetes mellitus (HCC) Low fat diet - Lipid panel - rosuvastatin  (CRESTOR ) 20 MG tablet; Take 1 tablet (20 mg total) by mouth daily.  Dispense: 90 tablet; Refill: 1  3. Controlled type 2 diabetes mellitus with stage 3 chronic kidney disease, without long-term current use of insulin (HCC) Continue to watch carbs in diet - Bayer DCA Hb A1c Waived - Microalbumin / creatinine urine ratio  4. Stage 3a chronic kidney disease (HCC) Labs pending  5. Normocytic anemia Labs pending  6. Gastroesophageal reflux disease, unspecified whether esophagitis present Avoid spicy foods Do not eat 2 hours prior to bedtime - omeprazole  (PRILOSEC) 20 MG capsule; Take 1 capsule (20 mg total) by mouth daily.  Dispense: 90 capsule; Refill: 1  7. Vitamin D  deficiency (Primary) Continue daily vitamin d  supplement  8. Morbid obesity (HCC) Discussed diet and exercise for person with BMI >25 Will recheck weight in 3-6 months    Labs pending Health Maintenance reviewed Diet and exercise encouraged  Follow up plan: 6  months   Mary-Margaret Gladis, FNP

## 2024-06-09 ENCOUNTER — Ambulatory Visit: Payer: Self-pay | Admitting: Nurse Practitioner

## 2024-06-09 LAB — CBC WITH DIFFERENTIAL/PLATELET
Basophils Absolute: 0.1 x10E3/uL (ref 0.0–0.2)
Basos: 1 %
EOS (ABSOLUTE): 0.2 x10E3/uL (ref 0.0–0.4)
Eos: 3 %
Hematocrit: 40.5 % (ref 34.0–46.6)
Hemoglobin: 12.8 g/dL (ref 11.1–15.9)
Immature Grans (Abs): 0.1 x10E3/uL (ref 0.0–0.1)
Immature Granulocytes: 1 %
Lymphocytes Absolute: 2 x10E3/uL (ref 0.7–3.1)
Lymphs: 24 %
MCH: 27.9 pg (ref 26.6–33.0)
MCHC: 31.6 g/dL (ref 31.5–35.7)
MCV: 88 fL (ref 79–97)
Monocytes Absolute: 0.6 x10E3/uL (ref 0.1–0.9)
Monocytes: 7 %
Neutrophils Absolute: 5.6 x10E3/uL (ref 1.4–7.0)
Neutrophils: 64 %
Platelets: 288 x10E3/uL (ref 150–450)
RBC: 4.58 x10E6/uL (ref 3.77–5.28)
RDW: 12.7 % (ref 11.7–15.4)
WBC: 8.6 x10E3/uL (ref 3.4–10.8)

## 2024-06-09 LAB — LIPID PANEL
Cholesterol, Total: 186 mg/dL (ref 100–199)
HDL: 57 mg/dL (ref >39)
LDL CALC COMMENT:: 3.3 ratio (ref 0.0–4.4)
LDL Chol Calc (NIH): 104 mg/dL — AB (ref 0–99)
Triglycerides: 144 mg/dL (ref 0–149)
VLDL Cholesterol Cal: 25 mg/dL (ref 5–40)

## 2024-06-09 LAB — CMP14+EGFR
ALT: 13 IU/L (ref 0–32)
AST: 16 IU/L (ref 0–40)
Albumin: 4.1 g/dL (ref 3.8–4.8)
Alkaline Phosphatase: 124 IU/L (ref 49–135)
BUN/Creatinine Ratio: 22 (ref 12–28)
BUN: 28 mg/dL — ABNORMAL HIGH (ref 8–27)
Bilirubin Total: 0.4 mg/dL (ref 0.0–1.2)
CO2: 24 mmol/L (ref 20–29)
Calcium: 9.6 mg/dL (ref 8.7–10.3)
Chloride: 106 mmol/L (ref 96–106)
Creatinine, Ser: 1.25 mg/dL — ABNORMAL HIGH (ref 0.57–1.00)
Globulin, Total: 1.8 g/dL (ref 1.5–4.5)
Glucose: 94 mg/dL (ref 70–99)
Potassium: 4.6 mmol/L (ref 3.5–5.2)
Sodium: 145 mmol/L — ABNORMAL HIGH (ref 134–144)
Total Protein: 5.9 g/dL — ABNORMAL LOW (ref 6.0–8.5)
eGFR: 44 mL/min/1.73 — ABNORMAL LOW (ref >59)

## 2024-06-09 LAB — MICROALBUMIN / CREATININE URINE RATIO
Creatinine, Urine: 62.4 mg/dL
Microalb/Creat Ratio: 10 mg/g{creat} (ref 0–29)
Microalbumin, Urine: 6.5 ug/mL

## 2024-06-11 ENCOUNTER — Ambulatory Visit: Admitting: Nurse Practitioner

## 2024-06-24 ENCOUNTER — Encounter: Payer: Self-pay | Admitting: Family Medicine

## 2024-06-24 ENCOUNTER — Ambulatory Visit: Admitting: Family Medicine

## 2024-06-24 VITALS — BP 114/65 | HR 83 | Temp 98.2°F | Ht 63.0 in | Wt 180.0 lb

## 2024-06-24 DIAGNOSIS — R202 Paresthesia of skin: Secondary | ICD-10-CM

## 2024-06-24 NOTE — Progress Notes (Signed)
 BP 114/65   Pulse 83   Temp 98.2 F (36.8 C) (Temporal)   Ht 5' 3 (1.6 m)   Wt 180 lb (81.6 kg)   SpO2 96%   BMI 31.89 kg/m    Subjective:   Patient ID: Sarah Benton, female    DOB: 22-Jan-1946, 78 y.o.   MRN: [id number]  HPI: Sarah Benton is a 78 y.o. female presenting on 06/24/2024 for Prickly feeling under skin (All over her body. Started Saturday with leg cramps)   Discussed the use of AI scribe software for clinical note transcription with the patient, who gave verbal consent to proceed.  History of Present Illness   Sarah Benton is an 78 year old female with chronic kidney disease and diet-controlled diabetes who presents with new onset tingling and burning sensations throughout her body.  Paresthesia and dysesthesia - New onset tingling and burning sensations throughout the body, described as 'creaking, burning,' tingling, prickling, and sometimes like a bee sting - Symptoms began on Sunday afternoon, four days prior to presentation - Sensations occur intermittently and affect various parts of the body, including the eyelashes - No prior history of similar symptoms - No specific area more affected than others  Muscle cramps - Severe leg cramps began Saturday night, disrupting sleep - History of leg cramps previously attributed to cholesterol medication - CoQ10 had been effective for leg cramps in the past  Recent medication changes - Discontinued meloxicam  two weeks ago after 23 years of use for bone spurs in the back, due to concerns related to chronic kidney disease - No immediate side effects after stopping meloxicam  - Tingling sensations began approximately one week after discontinuation - No other changes in medications or vitamins  Chronic kidney disease - Chronic kidney disease with GFR between 40 and 50  Diabetes mellitus - Diabetes managed through diet - Last hemoglobin A1c was 5.8 in September of the previous year - Not currently taking  any medication for diabetes  Nutritional status - Vitamin B12 levels have never been checked          Relevant past medical, surgical, family and social history reviewed and updated as indicated. Interim medical history since our last visit reviewed. Allergies and medications reviewed and updated.  Review of Systems  Constitutional:  Negative for chills and fever.  Eyes:  Negative for visual disturbance.  Respiratory:  Negative for chest tightness and shortness of breath.   Cardiovascular:  Negative for chest pain and leg swelling.  Genitourinary:  Negative for difficulty urinating and dysuria.  Musculoskeletal:  Negative for back pain and gait problem.  Skin:  Negative for rash.  Neurological:  Positive for numbness. Negative for dizziness, weakness, light-headedness and headaches.  Psychiatric/Behavioral:  Negative for agitation and behavioral problems.   All other systems reviewed and are negative.   Per HPI unless specifically indicated above   Allergies as of 06/24/2024       Reactions   Amoxicillin  Diarrhea   Crestor  [rosuvastatin  Calcium ]    Leg cramps   Septra [sulfamethoxazole-trimethoprim]    Nausea    Sulfamethoxazole-trimethoprim Other (See Comments)   Atorvastatin  Other (See Comments)   Myalgias / muscle aches        Medication List        Accurate as of June 24, 2024  9:27 AM. If you have any questions, ask your nurse or doctor.          STOP taking these medications    meloxicam  15  MG tablet Commonly known as: MOBIC  Stopped by: Fonda LABOR Tahsin Benyo       TAKE these medications    Accu-Chek Guide w/Device Kit USE UP TO FOUR TIMES DAILY AS DIRECTED Dx E11.9   aspirin  EC 81 MG tablet Take 1 tablet (81 mg total) by mouth daily. Swallow whole.   Co Q-10 200 MG Caps Take 200 mg by mouth daily.   fluticasone  0.05 % cream Commonly known as: CUTIVATE  APPLY TO THE AFFECTED AREAS UP TO TWICE A DAY AS NEEDED   ketoconazole 2 %  cream Commonly known as: NIZORAL APPLY TO THE AFFECTED AREAS UP TO TWICE A DAY AS NEEDED   lisinopril -hydrochlorothiazide  20-12.5 MG tablet Commonly known as: ZESTORETIC  Take 1 tablet by mouth daily.   meclizine  25 MG tablet Commonly known as: ANTIVERT  Take 1 tablet (25 mg total) by mouth 3 (three) times daily as needed for dizziness.   omeprazole  20 MG capsule Commonly known as: PRILOSEC Take 1 capsule (20 mg total) by mouth daily.   rosuvastatin  20 MG tablet Commonly known as: CRESTOR  Take 1 tablet (20 mg total) by mouth daily.   Vitamin D3 50 MCG (2000 UT) capsule Take 2,000 Units by mouth daily.         Objective:   BP 114/65   Pulse 83   Temp 98.2 F (36.8 C) (Temporal)   Ht 5' 3 (1.6 m)   Wt 180 lb (81.6 kg)   SpO2 96%   BMI 31.89 kg/m   Wt Readings from Last 3 Encounters:  06/24/24 180 lb (81.6 kg)  06/08/24 183 lb (83 kg)  05/06/24 182 lb 9.6 oz (82.8 kg)    Physical Exam Vitals and nursing note reviewed.  Constitutional:      Appearance: Normal appearance.  HENT:     Right Ear: Tympanic membrane normal.     Left Ear: Tympanic membrane normal.     Mouth/Throat:     Mouth: Mucous membranes are moist.     Pharynx: Oropharynx is clear. No oropharyngeal exudate or posterior oropharyngeal erythema.  Neurological:     Mental Status: She is alert.    Physical Exam   NEUROLOGICAL: Cranial nerves II, III, IV, and VI intact. Sensation symmetric and intact. Motor function normal, strength intact. No signs of stroke, strength normal.         Assessment & Plan:   Problem List Items Addressed This Visit   None Visit Diagnoses       Paresthesia    -  Primary   Relevant Orders   CBC with Differential/Platelet   CMP14+EGFR   TSH   Vitamin B12   C-reactive protein   Sedimentation rate           Paresthesia Intermittent tingling and burning sensations for four days. Differential includes B12 deficiency, thyroid  disease, inflammation, or nerve  impingement. Diabetes-related neuropathy unlikely due to atypical distribution and controlled diabetes. CKD unlikely given mild stage 3 CKD and managed GFR. Stopping meloxicam  considered but unlikely cause. - Order B12 level. - Order thyroid  function tests. - Recheck blood glucose levels. - Advise to stay hydrated. - Consider neurology referral if symptoms persist and blood work is normal.  Type 2 diabetes mellitus, diet controlled Diet-controlled with recent A1c of 5.8, indicating good control. Unlikely cause of paresthesia due to atypical presentation and good glycemic control. - Continue current dietary management. - Recheck blood glucose levels.  Chronic kidney disease, stage 3 Mild stage 3 CKD with GFR between 40 and 50,  normal for age. No symptoms or complications related to CKD. - Advise to maintain hydration with non-caffeinated fluids.          Follow up plan: Return if symptoms worsen or fail to improve.  Counseling provided for all of the vaccine components Orders Placed This Encounter  Procedures   CBC with Differential/Platelet   CMP14+EGFR   TSH   Vitamin B12   C-reactive protein   Sedimentation rate    Fonda Levins, MD [facility] 06/24/2024, 9:27 AM

## 2024-06-25 LAB — TSH: TSH: 1.21 u[IU]/mL (ref 0.450–4.500)

## 2024-06-25 LAB — CBC WITH DIFFERENTIAL/PLATELET
Basophils Absolute: 0.1 x10E3/uL (ref 0.0–0.2)
Basos: 1 %
EOS (ABSOLUTE): 0.1 x10E3/uL (ref 0.0–0.4)
Eos: 1 %
Hematocrit: 41.3 % (ref 34.0–46.6)
Hemoglobin: 13.1 g/dL (ref 11.1–15.9)
Immature Grans (Abs): 0 x10E3/uL (ref 0.0–0.1)
Immature Granulocytes: 0 %
Lymphocytes Absolute: 1.6 x10E3/uL (ref 0.7–3.1)
Lymphs: 20 %
MCH: 27.9 pg (ref 26.6–33.0)
MCHC: 31.7 g/dL (ref 31.5–35.7)
MCV: 88 fL (ref 79–97)
Monocytes Absolute: 0.5 x10E3/uL (ref 0.1–0.9)
Monocytes: 6 %
Neutrophils Absolute: 5.7 x10E3/uL (ref 1.4–7.0)
Neutrophils: 72 %
Platelets: 272 x10E3/uL (ref 150–450)
RBC: 4.7 x10E6/uL (ref 3.77–5.28)
RDW: 12.7 % (ref 11.7–15.4)
WBC: 7.9 x10E3/uL (ref 3.4–10.8)

## 2024-06-25 LAB — CMP14+EGFR
ALT: 13 IU/L (ref 0–32)
AST: 20 IU/L (ref 0–40)
Albumin: 4.3 g/dL (ref 3.8–4.8)
Alkaline Phosphatase: 119 IU/L (ref 49–135)
BUN/Creatinine Ratio: 20 (ref 12–28)
BUN: 27 mg/dL (ref 8–27)
Bilirubin Total: 0.4 mg/dL (ref 0.0–1.2)
CO2: 19 mmol/L — AB (ref 20–29)
Calcium: 9.7 mg/dL (ref 8.7–10.3)
Chloride: 106 mmol/L (ref 96–106)
Creatinine, Ser: 1.34 mg/dL — AB (ref 0.57–1.00)
Globulin, Total: 1.8 g/dL (ref 1.5–4.5)
Glucose: 106 mg/dL — AB (ref 70–99)
Potassium: 4.5 mmol/L (ref 3.5–5.2)
Sodium: 144 mmol/L (ref 134–144)
Total Protein: 6.1 g/dL (ref 6.0–8.5)
eGFR: 40 mL/min/1.73 — AB (ref >59)

## 2024-06-25 LAB — SEDIMENTATION RATE: Sed Rate: 16 mm/h (ref 0–40)

## 2024-06-25 LAB — VITAMIN B12: Vitamin B-12: 247 pg/mL (ref 232–1245)

## 2024-06-25 LAB — C-REACTIVE PROTEIN: CRP: 6 mg/L (ref 0–10)

## 2024-06-30 ENCOUNTER — Other Ambulatory Visit: Payer: Self-pay

## 2024-06-30 ENCOUNTER — Ambulatory Visit: Payer: Self-pay | Admitting: Family Medicine

## 2024-06-30 ENCOUNTER — Encounter: Payer: Self-pay | Admitting: Family Medicine

## 2024-06-30 DIAGNOSIS — R202 Paresthesia of skin: Secondary | ICD-10-CM

## 2024-07-01 DIAGNOSIS — E1142 Type 2 diabetes mellitus with diabetic polyneuropathy: Secondary | ICD-10-CM | POA: Diagnosis not present

## 2024-07-01 DIAGNOSIS — E669 Obesity, unspecified: Secondary | ICD-10-CM | POA: Diagnosis not present

## 2024-07-01 DIAGNOSIS — E1122 Type 2 diabetes mellitus with diabetic chronic kidney disease: Secondary | ICD-10-CM | POA: Diagnosis not present

## 2024-07-01 DIAGNOSIS — E785 Hyperlipidemia, unspecified: Secondary | ICD-10-CM | POA: Diagnosis not present

## 2024-07-01 DIAGNOSIS — K219 Gastro-esophageal reflux disease without esophagitis: Secondary | ICD-10-CM | POA: Diagnosis not present

## 2024-07-01 DIAGNOSIS — Z7982 Long term (current) use of aspirin: Secondary | ICD-10-CM | POA: Diagnosis not present

## 2024-07-01 DIAGNOSIS — M1990 Unspecified osteoarthritis, unspecified site: Secondary | ICD-10-CM | POA: Diagnosis not present

## 2024-07-01 DIAGNOSIS — N1831 Chronic kidney disease, stage 3a: Secondary | ICD-10-CM | POA: Diagnosis not present

## 2024-07-01 DIAGNOSIS — E1136 Type 2 diabetes mellitus with diabetic cataract: Secondary | ICD-10-CM | POA: Diagnosis not present

## 2024-07-02 NOTE — Telephone Encounter (Signed)
 Called and spoke with patient she is really worried about her Kidneys. Patient is really wanting to see the specialist kidney or a dietician so she knows what foods are good to eat.

## 2024-07-06 ENCOUNTER — Encounter: Payer: Self-pay | Admitting: Nurse Practitioner

## 2024-07-06 ENCOUNTER — Ambulatory Visit: Admitting: Nurse Practitioner

## 2024-07-06 VITALS — BP 105/63 | HR 55 | Temp 98.6°F | Ht 63.0 in | Wt 179.0 lb

## 2024-07-06 DIAGNOSIS — M5431 Sciatica, right side: Secondary | ICD-10-CM

## 2024-07-06 MED ORDER — KETOROLAC TROMETHAMINE 60 MG/2ML IM SOLN
60.0000 mg | Freq: Once | INTRAMUSCULAR | Status: AC
  Administered 2024-07-06: 60 mg via INTRAMUSCULAR

## 2024-07-06 MED ORDER — HYDROCODONE-ACETAMINOPHEN 5-325 MG PO TABS: 1.0000 | ORAL_TABLET | Freq: Four times a day (QID) | ORAL | 0 refills | Status: AC | PRN

## 2024-07-06 MED ORDER — PREDNISONE 20 MG PO TABS: ORAL_TABLET | ORAL | 0 refills | Status: AC

## 2024-07-06 MED ORDER — METHYLPREDNISOLONE ACETATE 80 MG/ML IJ SUSP
80.0000 mg | Freq: Once | INTRAMUSCULAR | Status: AC
  Administered 2024-07-06: 80 mg via INTRAMUSCULAR

## 2024-07-06 NOTE — Patient Instructions (Signed)
 Back Exercises These exercises help to make your trunk and back strong. They also help to keep the lower back flexible. Doing these exercises can help to prevent or lessen pain in your lower back. If you have back pain, try to do these exercises 2-3 times each day or as told by your doctor. As you get better, do the exercises once each day. Repeat the exercises more often as told by your doctor. To stop back pain from coming back, do the exercises once each day, or as told by your doctor. Do exercises exactly as told by your doctor. Stop right away if you feel sudden pain or your pain gets worse. Exercises Single knee to chest Do these steps 3-5 times in a row for each leg: Lie on your back on a firm bed or the floor with your legs stretched out. Bring one knee to your chest. Grab your knee or thigh with both hands and hold it in place. Pull on your knee until you feel a gentle stretch in your lower back or butt. Keep doing the stretch for 10-30 seconds. Slowly let go of your leg and straighten it. Pelvic tilt Do these steps 5-10 times in a row: Lie on your back on a firm bed or the floor with your legs stretched out. Bend your knees so they point up to the ceiling. Your feet should be flat on the floor. Tighten your lower belly (abdomen) muscles to press your lower back against the floor. This will make your tailbone point up to the ceiling instead of pointing down to your feet or the floor. Stay in this position for 5-10 seconds while you gently tighten your muscles and breathe evenly. Cat-cow Do these steps until your lower back bends more easily: Get on your hands and knees on a firm bed or the floor. Keep your hands under your shoulders, and keep your knees under your hips. You may put padding under your knees. Let your head hang down toward your chest. Tighten (contract) the muscles in your belly. Point your tailbone toward the floor so your lower back becomes rounded like the back of a  cat. Stay in this position for 5 seconds. Slowly lift your head. Let the muscles of your belly relax. Point your tailbone up toward the ceiling so your back forms a sagging arch like the back of a cow. Stay in this position for 5 seconds.  Press-ups Do these steps 5-10 times in a row: Lie on your belly (face-down) on a firm bed or the floor. Place your hands near your head, about shoulder-width apart. While you keep your back relaxed and keep your hips on the floor, slowly straighten your arms to raise the top half of your body and lift your shoulders. Do not use your back muscles. You may change where you place your hands to make yourself more comfortable. Stay in this position for 5 seconds. Keep your back relaxed. Slowly return to lying flat on the floor.  Bridges Do these steps 10 times in a row: Lie on your back on a firm bed or the floor. Bend your knees so they point up to the ceiling. Your feet should be flat on the floor. Your arms should be flat at your sides, next to your body. Tighten your butt muscles and lift your butt off the floor until your waist is almost as high as your knees. If you do not feel the muscles working in your butt and the back of  your thighs, slide your feet 1-2 inches (2.5-5 cm) farther away from your butt. Stay in this position for 3-5 seconds. Slowly lower your butt to the floor, and let your butt muscles relax. If this exercise is too easy, try doing it with your arms crossed over your chest. Belly crunches Do these steps 5-10 times in a row: Lie on your back on a firm bed or the floor with your legs stretched out. Bend your knees so they point up to the ceiling. Your feet should be flat on the floor. Cross your arms over your chest. Tip your chin a little bit toward your chest, but do not bend your neck. Tighten your belly muscles and slowly raise your chest just enough to lift your shoulder blades a tiny bit off the floor. Avoid raising your body  higher than that because it can put too much stress on your lower back. Slowly lower your chest and your head to the floor. Back lifts Do these steps 5-10 times in a row: Lie on your belly (face-down) with your arms at your sides, and rest your forehead on the floor. Tighten the muscles in your legs and your butt. Slowly lift your chest off the floor while you keep your hips on the floor. Keep the back of your head in line with the curve in your back. Look at the floor while you do this. Stay in this position for 3-5 seconds. Slowly lower your chest and your face to the floor. Contact a doctor if: Your back pain gets a lot worse when you do an exercise. Your back pain does not get better within 2 hours after you exercise. If you have any of these problems, stop doing the exercises. Do not do them again unless your doctor says it is okay. Get help right away if: You have sudden, very bad back pain. If this happens, stop doing the exercises. Do not do them again unless your doctor says it is okay. This information is not intended to replace advice given to you by your health care provider. Make sure you discuss any questions you have with your health care provider. Document Revised: 11/22/2020 Document Reviewed: 11/22/2020 Elsevier Patient Education  2024 [employer].

## 2024-07-06 NOTE — Progress Notes (Signed)
   Subjective:    Patient ID: Sarah Benton, female    DOB: May 11, 1946, 78 y.o.   MRN: [id number]   Chief Complaint: Back Pain (Radiating down right leg/)   Back Pain This is a chronic problem. The current episode started more than 1 year ago. The problem has been gradually worsening since onset. The pain is present in the lumbar spine. The quality of the pain is described as aching and shooting. The pain radiates to the right knee. The pain is at a severity of 8/10. The pain is moderate. The pain is The same all the time. Exacerbated by: different movemnets increase pain. Pertinent negatives include no paresis, paresthesias, tingling or weakness. Treatments tried: hydrocodone . The treatment provided mild relief.    Patient Active Problem List   Diagnosis Date Noted   Sinus bradycardia 02/20/2023   CKD (chronic kidney disease), stage III (HCC) 02/20/2023   Chronic back pain 02/20/2023   BPPV (benign paroxysmal positional vertigo) 02/19/2023   Controlled diabetes mellitus type 2 with complications (HCC) 12/10/2022   Vitamin D  deficiency 02/06/2016   Normocytic anemia 04/06/2012   GERD 11/08/2010   Hyperlipidemia associated with type 2 diabetes mellitus (HCC) 12/12/2008   Essential hypertension, benign 12/12/2008   Morbid obesity (HCC) 12/12/2008        Review of Systems  Constitutional: Negative.   HENT: Negative.    Cardiovascular: Negative.   Musculoskeletal:  Positive for back pain.  Neurological:  Negative for tingling, weakness and paresthesias.       Objective:   Physical Exam Constitutional:      Appearance: Normal appearance.  Cardiovascular:     Rate and Rhythm: Normal rate and regular rhythm.     Heart sounds: Normal heart sounds.  Pulmonary:     Breath sounds: Normal breath sounds.  Musculoskeletal:     Comments: FROM lumbar spine with pain on rotation in either direction (+) SLR right at 90 degrees Motor strength and sensation distally intact.   Neurological:     General: No focal deficit present.     Mental Status: She is alert and oriented to person, place, and time.  Psychiatric:        Mood and Affect: Mood normal.    BP 105/63   Pulse (!) 55   Temp 98.6 F (37 C) (Temporal)   Ht 5' 3 (1.6 m)   Wt 179 lb (81.2 kg)   SpO2 95%   BMI 31.71 kg/m         Assessment & Plan:   AKEILA LANA in today with chief complaint of Back Pain (Radiating down right leg/)   1. Sciatica of right side (Primary) Moist heat Rest RTO prn Back stretches - methylPREDNISolone  acetate (DEPO-MEDROL ) injection 80 mg - ketorolac  (TORADOL ) injection 60 mg - predniSONE  (DELTASONE ) 20 MG tablet; 2 po at sametime daily for 5 days-  Dispense: 10 tablet; Refill: 0 - HYDROcodone -acetaminophen  (NORCO/VICODIN) 5-325 MG tablet; Take 1 tablet by mouth every 6 (six) hours as needed for moderate pain (pain score 4-6).  Dispense: 20 tablet; Refill: 0    The above assessment and management plan was discussed with the patient. The patient verbalized understanding of and has agreed to the management plan. Patient is aware to call the clinic if symptoms persist or worsen. Patient is aware when to return to the clinic for a follow-up visit. Patient educated on when it is appropriate to go to the emergency department.   Sarah Gladis, FNP

## 2024-07-09 ENCOUNTER — Encounter: Payer: Self-pay | Admitting: Neurology

## 2024-09-22 NOTE — Progress Notes (Unsigned)
 "  Initial neurology clinic note  Reason for Evaluation: Consultation requested by Dettinger, Fonda LABOR, MD for an opinion regarding intermittent diffuse paresthesia. My final recommendations will be communicated back to the requesting physician by way of shared medical record or letter to requesting physician via [place]  mail.  HPI: This is Sarah Benton, a 78 y.o. right-handed female with a medical history of DM, CKD, HLD who presents to neurology clinic with the chief complaint of intermittent diffuse paresthesia. The patient is alone today.  Symptoms started on the afternoon of 06/20/24 suddenly while watching TV. It was a tingling like sensation from the top of the head to her feet. It lasted around 5 minutes and would come and go. It would come and go every day for at least a month. She went to someone in her PCP office a couple days later. She had some labs which she was told were unremarkable. She was then referred to neurology. Patient mentions that she had been taking meloxicam  for many years and had been stopped about 2 weeks prior. This is the only change that she was aware of.  Since onset, the symptoms have decreased in frequency. It is more likely to occur at night. She has random prick like sensations in various parts of her body, now only occurring a few times a week, and lasting ~1 minute. It is not that bothersome currently for patient.  Prior to 06/20/24, she denies any numbness or tingling. She denies muscle weakness of arms or legs, ptosis, diplopia, difficulty swallowing, or difficulty breathing.  She also mentions a long history of muscle cramps. She has been put on CoQ10 for this in the past. This is the most bothersome symptom currently. It can be very painful. She will get a cramp most every night, occasionally during the day as well.  She does have a history of low back pain and had a ruptured disc and ddd s/p surgery in 1998.  She does not report any constitutional  symptoms like fever, night sweats, anorexia or unintentional weight loss.  EtOH use: none  Restrictive diet? no Family history of neuropathy/myopathy/neurologic disease? no   MEDICATIONS:  Outpatient Encounter Medications as of 09/24/2024  Medication Sig   aspirin  EC 81 MG tablet Take 1 tablet (81 mg total) by mouth daily. Swallow whole.   Blood Glucose Monitoring Suppl (ACCU-CHEK GUIDE) w/Device KIT USE UP TO FOUR TIMES DAILY AS DIRECTED Dx E11.9   Cholecalciferol (VITAMIN D3) 2000 UNITS capsule Take 2,000 Units by mouth daily.   Coenzyme Q10 (CO Q-10) 200 MG CAPS Take 200 mg by mouth daily.   fluticasone  (CUTIVATE ) 0.05 % cream APPLY TO THE AFFECTED AREAS UP TO TWICE A DAY AS NEEDED   HYDROcodone -acetaminophen  (NORCO/VICODIN) 5-325 MG tablet Take 1 tablet by mouth every 6 (six) hours as needed for moderate pain (pain score 4-6).   ketoconazole (NIZORAL) 2 % cream APPLY TO THE AFFECTED AREAS UP TO TWICE A DAY AS NEEDED   lisinopril -hydrochlorothiazide  (ZESTORETIC ) 20-12.5 MG tablet Take 1 tablet by mouth daily.   meclizine  (ANTIVERT ) 25 MG tablet Take 1 tablet (25 mg total) by mouth 3 (three) times daily as needed for dizziness.   omeprazole  (PRILOSEC) 20 MG capsule Take 1 capsule (20 mg total) by mouth daily.   rosuvastatin  (CRESTOR ) 20 MG tablet Take 1 tablet (20 mg total) by mouth daily.   predniSONE  (DELTASONE ) 20 MG tablet 2 po at sametime daily for 5 days- (Patient not taking: Reported on 09/24/2024)  No facility-administered encounter medications on file as of 09/24/2024.    PAST MEDICAL HISTORY: Past Medical History:  Diagnosis Date   Anemia    Iron def   Arthritis    Cataract    Edema    Esophageal stricture    GERD (gastroesophageal reflux disease)    Hyperlipidemia    Hypertension    Obesity    Ruptured lumbar disc     PAST SURGICAL HISTORY: Past Surgical History:  Procedure Laterality Date   ABDOMINAL HYSTERECTOMY Bilateral 2001   complete   BREAST BIOPSY  1993    Left/negative   BREAST CYST EXCISION Left    LUMBAR LAMINECTOMY  1998   ruptured disc   TONSILLECTOMY     TUBAL LIGATION  1979    ALLERGIES: Allergies[1]  FAMILY HISTORY: Family History  Problem Relation Age of Onset   Parkinsonism Mother 66   Ovarian cancer Mother    Hypertension Father    Prostate cancer Father 11       met to bone / hip   Arthritis Sister        knee   Diabetes Daughter    Diabetes Son        type 2   Diabetes Grandchild 17       type 1   Colon cancer Neg Hx    Breast cancer Neg Hx     SOCIAL HISTORY: Social History[2] Social History   Social History Narrative   Are you right handed or left handed? right   Are you currently employed ?    What is your current occupation? retired   Do you live at home alone?yes   Who lives with you?    What type of home do you live in: 1 story or 2 story? one    Caffeine occ     OBJECTIVE: PHYSICAL EXAM: BP 110/73   Pulse 66   Ht 5' 3 (1.6 m)   Wt 181 lb (82.1 kg)   SpO2 97%   BMI 32.06 kg/m   General: General appearance: Awake and alert. No distress. Cooperative with exam.  Skin: No obvious rash or jaundice. HEENT: Atraumatic. Anicteric. Lungs: Non-labored breathing on room air  Heart: Regular Abdomen: Soft, non tender. Extremities: No edema. Psych: Affect appropriate.  Neurological: Mental Status: Alert. Speech fluent. No pseudobulbar affect Cranial Nerves: CNII: No RAPD. Visual fields grossly intact. CNIII, IV, VI: PERRL. No nystagmus. EOMI. CN V: Facial sensation intact bilaterally to fine touch. CN VII: Facial muscles symmetric and strong. No ptosis at rest. CN VIII: Hearing grossly intact bilaterally. CN IX: No hypophonia. CN X: Palate elevates symmetrically. CN XI: Full strength shoulder shrug bilaterally. CN XII: Tongue protrusion full and midline. No atrophy or fasciculations. No significant dysarthria Motor: Tone is normal. Strength is 5/5 in bilateral upper and lower  extremities Reflexes:  Right Left   Bicep 2+ 2+   Tricep 2+ 2+   BrRad 2+ 2+   Knee 2+ 2+   Ankle 1+ 2+    Pathological Reflexes: Babinski: flexor response bilaterally Hoffman: absent bilateral Troemner: absent bilateral Sensation: Pinprick: Intact in all extremities Vibration: Intact in all extremities Proprioception: Intact in bilateral great toes Coordination: Intact finger-to- nose-finger bilaterally. Romberg negative. Gait: Able to rise from chair with arms crossed unassisted. Normal, narrow-based gait.  Lab and Test Review: Internal labs: 06/24/24: ESR 16 CRP 6 B12: 247 TSH wnl CMP significant for glucose 106, Cr 1.34 CBC w/ diff unremarkable  06/08/24: Lipid panel: tChol  186, LDL 104, TG 144 HbA1c: 5.8  Imaging/Procedures: CTA head and neck (02/09/23): IMPRESSION: 1. No intracranial large vessel occlusion or significant stenosis. 2. No hemodynamically significant stenosis in the neck.  MRI brain wo contrast (02/10/23): FINDINGS: Brain: No restricted diffusion to suggest acute infarction. No midline shift, mass effect, evidence of mass lesion, ventriculomegaly, extra-axial collection or acute intracranial hemorrhage. Cervicomedullary junction and pituitary are within normal limits.   Elnor and white matter signal is within normal limits for age throughout the brain. Minimal nonspecific white matter T2/FLAIR signal changes. No convincing cortical encephalomalacia or chronic cerebral blood products.   Cerebral volume is within normal limits for age.   Vascular: Major intracranial vascular flow voids are preserved.   Skull and upper cervical spine: Negative. Visualized bone marrow signal is within normal limits.   Sinuses/Orbits: Rightward gaze, but otherwise normal orbits soft tissues. Paranasal sinuses are stable and well aerated.   Other: Grossly normal visible internal auditory structures aside from trace mastoid fluid which appears inconsequential.  Normal stylomastoid foramina. Negative visible scalp and face.   IMPRESSION: No acute intracranial abnormality. Normal for age noncontrast MRI appearance of the brain.  ASSESSMENT: Sarah Benton is a 78 y.o. female who presents for evaluation of sudden onset, diffuse, intermittent tingling of entire body and muscle cramps. She has a relevant medical history of DM, CKD, HLD. Her neurological examination is essentially normal today. Available diagnostic data is significant for borderline B12. The etiology of symptoms is unclear. Given the diffuse, bilateral, and intermittent nature, symptoms are difficult to localize. Her B12 was borderline low which could be contributing. Her muscle cramps are more chronic with known risk factor of CKD. I will get labs to look for other treatable causes. Fortunately, she appears to be improving in terms of the paresthesias.  PLAN: -Blood work: B1, folate, vit D, PTH, ionized Ca -Recommend B12 1000 mcg daily for borderline low B12 -OTC recommendations for cramps given (see AVS)  -Return to clinic as needed  The impression above as well as the plan as outlined below were extensively discussed with the patient who voiced understanding. All questions were answered to their satisfaction.  When available, results of the above investigations and possible further recommendations will be communicated to the patient via telephone/MyChart. Patient to call office if not contacted after expected testing turnaround time.   Total time spent reviewing records, interview, history/exam, documentation, and coordination of care on day of encounter:  50 min   Thank you for allowing me to participate in patient's care.  If I can answer any additional questions, I would be pleased to do so.  Venetia Potters, MD   CC: Gladis Mustard, FNP [address] [place] [address]  CC: Referring provider: Dettinger, Fonda LABOR, MD [address] [place],  [place]  [address]     [1]  Allergies Allergen Reactions   Amoxicillin  Diarrhea   Crestor  [Rosuvastatin  Calcium ]     Leg cramps    Septra [Sulfamethoxazole-Trimethoprim]     Nausea    Sulfamethoxazole-Trimethoprim Other (See Comments)   Atorvastatin  Other (See Comments)    Myalgias / muscle aches   [2]  Social History Tobacco Use   Smoking status: Never   Smokeless tobacco: Never  Vaping Use   Vaping status: Never Used  Substance Use Topics   Alcohol use: No    Alcohol/week: 0.0 standard drinks of alcohol   Drug use: No   "

## 2024-09-24 ENCOUNTER — Encounter: Payer: Self-pay | Admitting: Neurology

## 2024-09-24 ENCOUNTER — Other Ambulatory Visit

## 2024-09-24 ENCOUNTER — Ambulatory Visit: Admitting: Neurology

## 2024-09-24 VITALS — BP 110/73 | HR 66 | Ht 63.0 in | Wt 181.0 lb

## 2024-09-24 DIAGNOSIS — R252 Cramp and spasm: Secondary | ICD-10-CM | POA: Diagnosis not present

## 2024-09-24 DIAGNOSIS — E538 Deficiency of other specified B group vitamins: Secondary | ICD-10-CM | POA: Diagnosis not present

## 2024-09-24 DIAGNOSIS — R202 Paresthesia of skin: Secondary | ICD-10-CM

## 2024-09-24 NOTE — Patient Instructions (Addendum)
 I saw you today for the tingling sensations all over that started on 06/20/24 and muscle cramps. Your neurologic examination looks great without any clear cause of symptoms.  I will check more labs today to look for other treatable causes. I will be in touch when I have the results.  Recommend the following measures that may provide some symptomatic benefit for muscle twitching and/or cramps: - Adequate oral clear fluid intake to maintain optimal hydration (about 2.5 liters, or around 8-10 glasses per day) Avoidance of caffeine Trial of DIET tonic water: About 1 glass, up to 6 times daily Magnesium oxide up to 400 mg by mouth twice daily, as needed (over the counter) Gentle muscle stretching routine, especially before bedtime Some people will try a spoonful of mustard to relieve cramps as well  Your B12 was borderline low. Given your symptoms, I would recommend supplementing with B12 1000 mcg daily. This can be bought over the counter at any local drug store or online.   Please reach out if you have new or worsening symptoms. Please let me know if you have any questions or concerns in the meantime.   The physicians and staff at [facility] are committed to providing excellent care. You may receive a survey requesting feedback about your experience at our office. We strive to receive very good responses to the survey questions. If you feel that your experience would prevent you from giving the office a very good  response, please contact our office to try to remedy the situation. We may be reached at [phone]. Thank you for taking the time out of your busy day to complete the survey.  Venetia Potters, MD [facility]

## 2024-09-27 LAB — CALCIUM, IONIZED: Calcium, Ion: 5.3 mg/dL (ref 4.7–5.5)

## 2024-09-27 LAB — VITAMIN B1: Vitamin B1 (Thiamine): 13 nmol/L (ref 8–30)

## 2024-09-27 LAB — FOLATE: Folate: 3.1 ng/mL — ABNORMAL LOW

## 2024-09-27 LAB — PARATHYROID HORMONE, INTACT (NO CA): PTH: 87 pg/mL — ABNORMAL HIGH (ref 16–77)

## 2024-09-27 LAB — VITAMIN D 25 HYDROXY (VIT D DEFICIENCY, FRACTURES): Vit D, 25-Hydroxy: 55 ng/mL (ref 30–100)

## 2024-09-28 ENCOUNTER — Telehealth: Payer: Self-pay | Admitting: Neurology

## 2024-09-28 NOTE — Telephone Encounter (Signed)
 Called patient to discuss lab results.  Her folate was low. I recommended supplementing with folic acid  1 mg daily.  While her Vit D and ionized Ca were normal, her PTH was mildly elevated to 87 (upper limit of normal being 77). I do not know if this is clinically significant or would explain muscle cramps, but I recommended she discuss with her PCP to be sure.  All questions were answered.  Sarah Potters, MD [facility]

## 2024-10-04 ENCOUNTER — Other Ambulatory Visit: Payer: Self-pay | Admitting: Nurse Practitioner

## 2024-10-04 DIAGNOSIS — Z1231 Encounter for screening mammogram for malignant neoplasm of breast: Secondary | ICD-10-CM

## 2024-10-05 ENCOUNTER — Telehealth: Payer: Self-pay

## 2024-10-05 NOTE — Telephone Encounter (Signed)
 Called and line is busy   Copied from CRM [id number]. Topic: Appointments - Scheduling Inquiry for Clinic >> Oct 04, 2024  9:37 AM Mercer PEDLAR wrote: Reason for CRM: Patient would like a callback to schedule a bone density appointment.

## 2024-10-08 NOTE — Telephone Encounter (Signed)
 Appt made

## 2024-10-12 ENCOUNTER — Ambulatory Visit

## 2024-10-12 ENCOUNTER — Other Ambulatory Visit: Payer: Self-pay | Admitting: Nurse Practitioner

## 2024-10-12 DIAGNOSIS — Z13820 Encounter for screening for osteoporosis: Secondary | ICD-10-CM

## 2024-10-12 DIAGNOSIS — Z780 Asymptomatic menopausal state: Secondary | ICD-10-CM

## 2024-10-13 ENCOUNTER — Ambulatory Visit: Payer: Self-pay | Admitting: Nurse Practitioner

## 2024-10-21 ENCOUNTER — Ambulatory Visit: Admitting: Neurology

## 2024-11-16 ENCOUNTER — Ambulatory Visit

## 2024-12-07 ENCOUNTER — Ambulatory Visit: Payer: Self-pay | Admitting: Nurse Practitioner
